# Patient Record
Sex: Male | Born: 1950 | Race: White | Hispanic: No | Marital: Married | Smoking: Former smoker
Health system: Southern US, Community
[De-identification: ages and names within clinical notes are randomized; demographics above are authoritative.]

## PROBLEM LIST (undated history)

## (undated) DIAGNOSIS — J302 Other seasonal allergic rhinitis: Secondary | ICD-10-CM

## (undated) DIAGNOSIS — R042 Hemoptysis: Secondary | ICD-10-CM

## (undated) DIAGNOSIS — C7931 Secondary malignant neoplasm of brain: Principal | ICD-10-CM

## (undated) DIAGNOSIS — F419 Anxiety disorder, unspecified: Secondary | ICD-10-CM

## (undated) DIAGNOSIS — F329 Major depressive disorder, single episode, unspecified: Secondary | ICD-10-CM

## (undated) DIAGNOSIS — J449 Chronic obstructive pulmonary disease, unspecified: Secondary | ICD-10-CM

## (undated) DIAGNOSIS — C3490 Malignant neoplasm of unspecified part of unspecified bronchus or lung: Secondary | ICD-10-CM

## (undated) DIAGNOSIS — I10 Essential (primary) hypertension: Secondary | ICD-10-CM

## (undated) DIAGNOSIS — Z923 Personal history of irradiation: Secondary | ICD-10-CM

## (undated) DIAGNOSIS — E781 Pure hyperglyceridemia: Secondary | ICD-10-CM

## (undated) DIAGNOSIS — F32A Depression, unspecified: Secondary | ICD-10-CM

## (undated) DIAGNOSIS — C4490 Unspecified malignant neoplasm of skin, unspecified: Secondary | ICD-10-CM

## (undated) DIAGNOSIS — C801 Malignant (primary) neoplasm, unspecified: Secondary | ICD-10-CM

## (undated) HISTORY — DX: Major depressive disorder, single episode, unspecified: F32.9

## (undated) HISTORY — DX: Anxiety disorder, unspecified: F41.9

## (undated) HISTORY — DX: Personal history of irradiation: Z92.3

## (undated) HISTORY — DX: Malignant neoplasm of unspecified part of unspecified bronchus or lung: C34.90

## (undated) HISTORY — DX: Essential (primary) hypertension: I10

## (undated) HISTORY — DX: Unspecified malignant neoplasm of skin, unspecified: C44.90

## (undated) HISTORY — PX: TYMPANOPLASTY: SHX33

## (undated) HISTORY — DX: Depression, unspecified: F32.A

## (undated) HISTORY — PX: TONSILLECTOMY: SUR1361

## (undated) HISTORY — DX: Pure hyperglyceridemia: E78.1

---

## 2012-06-07 ENCOUNTER — Telehealth: Payer: Self-pay

## 2012-06-07 NOTE — Telephone Encounter (Signed)
I spoke with Derrick Crosby and pt is scheduled to come in and see Dr. Herma Carson on Friday at 4:00. Derrick Crosby is aware to have CT/CXr placed on disc and send over to our office prior to pt appt. He states he will call and have this done. He is also aware to have pt arrive 15 min prior to 4:00 appt.

## 2012-06-07 NOTE — Progress Notes (Addendum)
Name: Derrick Crosby MRN: [id number] DOB: 01-22-1951  Referring Physician:  [name] Reason for Consultation:  Lung mass   INTERVENTIONAL PULMONOLOGY CONSULTATION NOTE   History of Present Illness:  61 year old active smoker with chronic obstructive pulmonary disease and baseline dyspnea at rest and on exertion as well as chronic cough with sputum production referred here for further evaluation of left hilar mass. Apparently the patient has been coughing up blood for quite some time.  He is not sure if he lost any weight.  He denies fevers, chills or purulent sputum.  There are no voice changes.  There are no headaches, weakness, numbness or double vision.  Past Medical History:  COPD, HTN, depression, anxiety, dyslipidemia, skin cancer  Medications:  ASA, Celexa, Clonazepam   Allergies:  None  Family History Lung disease:  None Malignancies:  Mother (smoker) - lung cancer  Social History Smoking:  1.5 ppd x 50 years = 75 pack years.  Actively smoking. Occupational exposure:  None  Review Of Systems: Constitutional: Negative for fever, chills, weight loss, malaise/fatigue and diaphoresis.  HENT: Negative for hearing loss, ear pain, nosebleeds, congestion, sore throat, neck pain, tinnitus and ear discharge.   Eyes: Negative for blurred vision, double vision, photophobia, pain, discharge and redness.  Respiratory: Positive for cough, hemoptysis, sputum production, shortness of breath, wheezing.  Negative for stridor.   Cardiovascular: Negative for chest pain, palpitations, orthopnea, claudication, leg swelling and PND.  Gastrointestinal: Negative for heartburn, nausea, vomiting, abdominal pain, diarrhea, constipation, blood in stool and melena.  Genitourinary: Negative for dysuria, urgency, frequency, hematuria and flank pain.  Musculoskeletal: Negative for myalgias, back pain, joint pain and falls.  Skin: Negative for itching and rash.  Neurological: Negative for dizziness,  tingling, tremors, sensory change, speech change, focal weakness, seizures, loss of consciousness, weakness and headaches.   Positive for anxiety / depression. Endo/Heme/Allergies: Negative for environmental allergies and polydipsia. Does not bruise/bleed easily.   Vital Signs: There were no vitals filed for this visit.  Physical Examination: General:  Looks older then stated age, no acute distress Neuro:  Alert, oriented, nonfocal HEENT:  Pink conjunctivae, moist mucus membranes Neck:  No lymphadenopathy Cardiovascular:  RRR, no murmurs Lungs:  Coarse bilateral rales, few expiratory wheezes, diminished air entry, prolonged expiratory phase Abdomen:  Soft, nontender, nondistended Musculoskeletal:  No cyanosis or edema,  Skin: No rash   Labs: No results found for this basename: HGB:3,HCT:3,WBC:3,PLT:3,NA:5,K:2,CL:5,CO2:5,GLUCOSE:5,BUN:5,CREATININE:5,CALCIUM:5,MG:5,PHOS:5,INR:5,APTT:5 in the last 168 hours  Chest CT(images reviewed):  06/06/2012 Large, central left hilar mass with direct extension into the posterior mediastinum. Enlarged AP window and subcarinal lymph nodes. Worrisome for metastatic adenopathy.  PFT:  NA  TTE:  NA  ASSESSMENT AND PLAN  Left hilar mass highly suspicious for primary bronchogenic carcinoma Hemoptysis Tobacco abuse COPD  -->  Flexible bronchoscopy / EBUS-TBNA for tissue diagnosis and staging -->  PET/CT -->  Full PFT -->  Smoking cessation -->  Follow up office appointment with test results  Orlean Bradford, M.D., F.C.C.P. Interventional Pulmonology [facility] Cell: [phone] Pager: [phone]  06/07/2012, 8:37 PM

## 2012-06-09 ENCOUNTER — Encounter: Payer: Self-pay | Admitting: *Deleted

## 2012-06-09 ENCOUNTER — Ambulatory Visit (INDEPENDENT_AMBULATORY_CARE_PROVIDER_SITE_OTHER): Payer: BC Managed Care – PPO | Admitting: Pulmonary Disease

## 2012-06-09 VITALS — BP 130/76 | HR 72 | Temp 97.1°F | Ht 72.0 in | Wt 157.4 lb

## 2012-06-09 DIAGNOSIS — R918 Other nonspecific abnormal finding of lung field: Secondary | ICD-10-CM

## 2012-06-09 DIAGNOSIS — J449 Chronic obstructive pulmonary disease, unspecified: Secondary | ICD-10-CM

## 2012-06-09 DIAGNOSIS — R222 Localized swelling, mass and lump, trunk: Secondary | ICD-10-CM

## 2012-06-09 NOTE — Patient Instructions (Addendum)
Bronchoscopy / EBUS (scheduled 8/6 @ 8:30) Follow up appointment with results 8/9 Full PFT 8/9

## 2012-06-12 ENCOUNTER — Telehealth: Payer: Self-pay | Admitting: Pulmonary Disease

## 2012-06-12 ENCOUNTER — Encounter (HOSPITAL_COMMUNITY): Payer: Self-pay | Admitting: Pharmacy Technician

## 2012-06-12 ENCOUNTER — Encounter (HOSPITAL_COMMUNITY): Payer: Self-pay | Admitting: *Deleted

## 2012-06-12 NOTE — Telephone Encounter (Signed)
Called [phone] spoke with Weyman Croon at [facility] and pre cert is not required for any outpatient procedures, including bronch's. LMOAM for Karima to return my call. Rhonda J Cobb

## 2012-06-13 ENCOUNTER — Encounter (HOSPITAL_COMMUNITY)
Admission: RE | Disposition: A | Discharge status: Home / Self Care | Payer: Self-pay | Source: Ambulatory Visit | Attending: Pulmonary Disease

## 2012-06-13 ENCOUNTER — Encounter (HOSPITAL_COMMUNITY): Payer: Self-pay | Admitting: Anesthesiology

## 2012-06-13 ENCOUNTER — Encounter (HOSPITAL_COMMUNITY): Payer: Self-pay | Admitting: Pulmonary Disease

## 2012-06-13 ENCOUNTER — Telehealth: Payer: Self-pay | Admitting: Pulmonary Disease

## 2012-06-13 ENCOUNTER — Ambulatory Visit (HOSPITAL_COMMUNITY): Payer: BC Managed Care – PPO | Admitting: Anesthesiology

## 2012-06-13 ENCOUNTER — Ambulatory Visit (HOSPITAL_COMMUNITY)
Admission: RE | Admit: 2012-06-13 | Discharge: 2012-06-13 | Disposition: A | Discharge status: Home / Self Care | Payer: BC Managed Care – PPO | Source: Ambulatory Visit | Attending: Pulmonary Disease | Admitting: Pulmonary Disease

## 2012-06-13 ENCOUNTER — Other Ambulatory Visit: Payer: Self-pay | Admitting: Pulmonary Disease

## 2012-06-13 DIAGNOSIS — F17200 Nicotine dependence, unspecified, uncomplicated: Secondary | ICD-10-CM | POA: Insufficient documentation

## 2012-06-13 DIAGNOSIS — R222 Localized swelling, mass and lump, trunk: Secondary | ICD-10-CM | POA: Insufficient documentation

## 2012-06-13 DIAGNOSIS — R042 Hemoptysis: Secondary | ICD-10-CM | POA: Insufficient documentation

## 2012-06-13 DIAGNOSIS — C3400 Malignant neoplasm of unspecified main bronchus: Secondary | ICD-10-CM | POA: Insufficient documentation

## 2012-06-13 DIAGNOSIS — J449 Chronic obstructive pulmonary disease, unspecified: Secondary | ICD-10-CM | POA: Insufficient documentation

## 2012-06-13 DIAGNOSIS — J4489 Other specified chronic obstructive pulmonary disease: Secondary | ICD-10-CM | POA: Insufficient documentation

## 2012-06-13 DIAGNOSIS — C3490 Malignant neoplasm of unspecified part of unspecified bronchus or lung: Secondary | ICD-10-CM

## 2012-06-13 DIAGNOSIS — I10 Essential (primary) hypertension: Secondary | ICD-10-CM | POA: Insufficient documentation

## 2012-06-13 DIAGNOSIS — R918 Other nonspecific abnormal finding of lung field: Secondary | ICD-10-CM

## 2012-06-13 HISTORY — DX: Chronic obstructive pulmonary disease, unspecified: J44.9

## 2012-06-13 HISTORY — DX: Hemoptysis: R04.2

## 2012-06-13 HISTORY — DX: Malignant neoplasm of unspecified part of unspecified bronchus or lung: C34.90

## 2012-06-13 HISTORY — DX: Malignant (primary) neoplasm, unspecified: C80.1

## 2012-06-13 HISTORY — DX: Other seasonal allergic rhinitis: J30.2

## 2012-06-13 HISTORY — PX: VIDEO BRONCHOSCOPY: SHX5072

## 2012-06-13 LAB — CBC
HCT: 41.7 % (ref 39.0–52.0)
Hemoglobin: 14.8 g/dL (ref 13.0–17.0)
MCHC: 35.5 g/dL (ref 30.0–36.0)
WBC: 9.4 10*3/uL (ref 4.0–10.5)

## 2012-06-13 LAB — PROTIME-INR
INR: 1 (ref 0.00–1.49)
Prothrombin Time: 13.4 seconds (ref 11.6–15.2)

## 2012-06-13 LAB — COMPREHENSIVE METABOLIC PANEL
ALT: 18 U/L (ref 0–53)
BUN: 18 mg/dL (ref 6–23)
CO2: 25 mEq/L (ref 19–32)
Calcium: 9.7 mg/dL (ref 8.4–10.5)
Creatinine, Ser: 0.98 mg/dL (ref 0.50–1.35)
GFR calc Af Amer: >90 mL/min (ref >90)
GFR calc non Af Amer: 86 mL/min — ABNORMAL LOW (ref >90)
Glucose, Bld: 98 mg/dL (ref 70–99)
Sodium: 137 mEq/L (ref 135–145)
Total Protein: 7.1 g/dL (ref 6.0–8.3)

## 2012-06-13 LAB — APTT: aPTT: 36 seconds (ref 24–37)

## 2012-06-13 SURGERY — BRONCHOSCOPY, WITH EBUS
Anesthesia: General | Site: Chest | Wound class: Clean Contaminated

## 2012-06-13 MED ORDER — LACTATED RINGERS IV SOLN
INTRAVENOUS | Status: DC
  Administered 2012-06-13: 09:00:00 via INTRAVENOUS

## 2012-06-13 MED ORDER — ONDANSETRON HCL 4 MG/2ML IJ SOLN
INTRAMUSCULAR | Status: DC | PRN
  Administered 2012-06-13: 4 mg via INTRAVENOUS

## 2012-06-13 MED ORDER — PROPOFOL 10 MG/ML IV EMUL
INTRAVENOUS | Status: DC | PRN
  Administered 2012-06-13: 160 mg via INTRAVENOUS

## 2012-06-13 MED ORDER — 0.9 % SODIUM CHLORIDE (POUR BTL) OPTIME
TOPICAL | Status: DC | PRN
  Administered 2012-06-13: 1000 mL

## 2012-06-13 MED ORDER — NEOSTIGMINE METHYLSULFATE 1 MG/ML IJ SOLN
INTRAMUSCULAR | Status: DC | PRN
  Administered 2012-06-13: 4 mg via INTRAVENOUS

## 2012-06-13 MED ORDER — FENTANYL CITRATE 0.05 MG/ML IJ SOLN
INTRAMUSCULAR | Status: DC | PRN
  Administered 2012-06-13: 50 ug via INTRAVENOUS

## 2012-06-13 MED ORDER — FENTANYL CITRATE 0.05 MG/ML IJ SOLN
INTRAMUSCULAR | Status: AC
  Filled 2012-06-13: qty 2

## 2012-06-13 MED ORDER — EPINEPHRINE HCL 1 MG/ML IJ SOLN
INTRAMUSCULAR | Status: DC | PRN
  Administered 2012-06-13: 1 mg

## 2012-06-13 MED ORDER — GLYCOPYRROLATE 0.2 MG/ML IJ SOLN
INTRAMUSCULAR | Status: DC | PRN
  Administered 2012-06-13: .7 mg via INTRAVENOUS

## 2012-06-13 MED ORDER — ROCURONIUM BROMIDE 100 MG/10ML IV SOLN
INTRAVENOUS | Status: DC | PRN
  Administered 2012-06-13: 40 mg via INTRAVENOUS

## 2012-06-13 MED ORDER — FENTANYL CITRATE 0.05 MG/ML IJ SOLN: 50.0000 ug | INTRAMUSCULAR | Status: DC | PRN

## 2012-06-13 MED ORDER — LACTATED RINGERS IV SOLN
INTRAVENOUS | Status: DC | PRN
  Administered 2012-06-13 (×2): via INTRAVENOUS

## 2012-06-13 MED ORDER — LIDOCAINE HCL (CARDIAC) 20 MG/ML IV SOLN
INTRAVENOUS | Status: DC | PRN
  Administered 2012-06-13: 100 mg via INTRAVENOUS

## 2012-06-13 MED ORDER — FENTANYL CITRATE 0.05 MG/ML IJ SOLN
25.0000 ug | INTRAMUSCULAR | Status: DC | PRN
  Administered 2012-06-13: 25 ug via INTRAVENOUS
  Administered 2012-06-13: 50 ug via INTRAVENOUS
  Administered 2012-06-13: 25 ug via INTRAVENOUS

## 2012-06-13 MED ORDER — MIDAZOLAM HCL 5 MG/5ML IJ SOLN
INTRAMUSCULAR | Status: DC | PRN
  Administered 2012-06-13: 2 mg via INTRAVENOUS

## 2012-06-13 MED ORDER — MIDAZOLAM HCL 2 MG/2ML IJ SOLN: 1.0000 mg | INTRAMUSCULAR | Status: DC | PRN

## 2012-06-13 SURGICAL SUPPLY — 23 items
BRUSH CYTOL CELLEBRITY 1.5X140 (MISCELLANEOUS) IMPLANT
CANISTER SUCTION 2500CC (MISCELLANEOUS) ×2 IMPLANT
CLOTH BEACON ORANGE TIMEOUT ST (SAFETY) ×2 IMPLANT
CONT SPEC 4OZ CLIKSEAL STRL BL (MISCELLANEOUS) ×4 IMPLANT
COVER TABLE BACK 60X90 (DRAPES) ×2 IMPLANT
FILTER STRAW FLUID ASPIR (MISCELLANEOUS) ×2 IMPLANT
FORCEPS BIOP RJ4 1.8 (CUTTING FORCEPS) ×2 IMPLANT
GLOVE SURG SIGNA 7.5 PF LTX (GLOVE) ×2 IMPLANT
KIT ROOM TURNOVER OR (KITS) ×2 IMPLANT
MARKER SKIN DUAL TIP RULER LAB (MISCELLANEOUS) ×2 IMPLANT
NEEDLE BIOPSY TRANSBRONCH 21G (NEEDLE) IMPLANT
NEEDLE SYS SONOTIP II EBUSTBNA (NEEDLE) ×4 IMPLANT
NS IRRIG 1000ML POUR BTL (IV SOLUTION) ×2 IMPLANT
OIL SILICONE PENTAX (PARTS (SERVICE/REPAIRS)) ×2 IMPLANT
PAD ARMBOARD 7.5X6 YLW CONV (MISCELLANEOUS) ×4 IMPLANT
SPONGE GAUZE 4X4 12PLY (GAUZE/BANDAGES/DRESSINGS) IMPLANT
SYR 20CC LL (SYRINGE) ×2 IMPLANT
SYR 20ML ECCENTRIC (SYRINGE) ×2 IMPLANT
SYR 5ML LUER SLIP (SYRINGE) ×4 IMPLANT
SYR TOOMEY 50ML (SYRINGE) ×4 IMPLANT
TOWEL OR 17X24 6PK STRL BLUE (TOWEL DISPOSABLE) ×2 IMPLANT
TRAP SPECIMEN MUCOUS 40CC (MISCELLANEOUS) ×2 IMPLANT
TUBE CONNECTING 12X1/4 (SUCTIONS) ×2 IMPLANT

## 2012-06-13 NOTE — Preoperative (Signed)
Beta Blockers   Reason not to administer Beta Blockers:Not Applicable. No home beta blockers 

## 2012-06-13 NOTE — Transfer of Care (Signed)
Immediate Anesthesia Transfer of Care Note  Patient: Derrick Crosby  Procedure(s) Performed: Procedure(s) (LRB): VIDEO BRONCHOSCOPY WITH ENDOBRONCHIAL ULTRASOUND (N/A)  Patient Location: PACU  Anesthesia Type: General  Level of Consciousness: awake, alert  and oriented  Airway & Oxygen Therapy: Patient Spontanous Breathing and Patient connected to nasal cannula oxygen  Post-op Assessment: Report given to PACU RN and Post -op Vital signs reviewed and stable  Post vital signs: Reviewed and stable  Complications: No apparent anesthesia complications

## 2012-06-13 NOTE — Op Note (Signed)
Name:  [name] MRN:  [id number] DOB:  1951/01/03  OP NOTE  Procedure(s): Flexible bronchoscopy [phone]) Endobronchial biopsy (62130) of the left mainstem bronchus lesion Endobronchial ultrasound ([id number]) Transbronchial needle aspiration (46962) of the left hilar mass  Indications:  Left hilar mass suspicious for bronchogenic carcinoma.  Hemoptysis.  Consent:  Procedure, benefits, risks and alternatives discussed.  Questions answered.  Consent obtained.  Anesthesia:  General endotracheal.  Procedure summary:  Appropriate equipment was assembled.  The patient was brought to the operating room and identified as Derrick Crosby.  Safety timeout was performed. The patient was placed supine on the operating table, airway established and general anesthesia administered by Anesthesia team.   After the appropriate level of anesthesia was assured, flexible video bronchoscope was lubricated and inserted through the endotracheal tube.    Airway examination was performed bilaterally.  Trachea appear normal.  Main carina was not splayed.  Right lung airways were examined to the subsegmental level and no mucosal abnormalities or endobronchial lesions were identified. Then the attention was turned to the left lung airway. Moderate amount of old blood was aspirated from the left mainstem bronchus. Small friable mucosal lesion measuring about 5 mm in diameter was noted on the posteriolateral surface of the left mainstem bronchus immediately above the secondary carina. That was biopsied with forceps. Minor blood oozing was controlled was 3 mL of 1:20,000 dilution epinephrine used via the bronchoscope. Left upper lobe airway and lingula airway appeared normal. Left lower lobe bronchus was 90% obstructed by friable endobronchial mass which was oozing blood. I was able to pass the bronchoscope past the lesion and visualized distal airways which appear nomal.  The decision was made not to pursue any further  endobronchial forceps biopsy out of fear of further hemorrhage.  Endobronchial ultrasound video bronchoscope was then lubricated and inserted through the endotracheal tube. Surveillance of the mediastinal and and bilateral hilar lymph node stations was performed.  Paratracheal lymph nodes could not be easily visualized. There were no abnormal station 7 and station 10/11 R lymph nodes noted.  Endobronchial ultrasound guided transbronchial needle aspiration of the left hilar mass was then performed.  Slides were sent for intraoperative assessment and revealed high grade malignancy.  EBUS bronchoscope was withdrawn.  Flexible video bronchoscope was used again to assure hemostasis.  Additional 3 mL of 1:20,000 epinephrine were used.  After hemostasis was assured, the bronchoscope was withdrawn.  The patient was extubated in operating room and transferred to PACU.  Specimens sent: Left mainstem bronchus endobronchial lesion forceps biopsy sample for pathology Left hilar mass transbronchial needle aspiration  sample for pathology  Complications:  No immediate complications were noted.  Hemodynamic parameters and oxygenation remained stable throughout the procedure.  Estimated blood loss:  Less then 15 mL.  Orlean Bradford, M.D. Pulmonary and Critical Care Medicine [facility] Cell: [phone]  06/13/2012, 8:04 PM

## 2012-06-13 NOTE — Telephone Encounter (Signed)
I spoke with spouse and she is wanting to know if pt can eat before his PET scan on Friday. I advised her pt is not suppose to eat/drink anything after midnight. She then wanted to know if he could before his appt with Dr. Herma Carson. I advised her that was fine. Nothing further was needed

## 2012-06-13 NOTE — Anesthesia Postprocedure Evaluation (Signed)
  Anesthesia Post-op Note  Patient: Derrick Crosby  Procedure(s) Performed: Procedure(s) (LRB): VIDEO BRONCHOSCOPY WITH ENDOBRONCHIAL ULTRASOUND (N/A)  Patient Location: PACU  Anesthesia Type: General  Level of Consciousness: awake  Airway and Oxygen Therapy: Patient Spontanous Breathing  Post-op Pain: none  Post-op Assessment: Post-op Vital signs reviewed, Patient's Cardiovascular Status Stable, Respiratory Function Stable and Patent Airway  Post-op Vital Signs: stable  Complications: No apparent anesthesia complications

## 2012-06-13 NOTE — Interval H&P Note (Signed)
Patient examined.  Records reviewed.  No changes since last seen 06/09/2012.  Procedure explained.  Questions answered.  Consent obtained.

## 2012-06-13 NOTE — Anesthesia Preprocedure Evaluation (Signed)
Anesthesia Evaluation  Patient identified by MRN, date of birth, ID band Patient awake    Reviewed: Allergy & Precautions, H&P , NPO status   Airway Mallampati: I TM Distance: >3 FB Neck ROM: Full    Dental  (+) Edentulous Upper and Edentulous Lower   Pulmonary COPD + rhonchi         Cardiovascular hypertension,     Neuro/Psych    GI/Hepatic   Endo/Other    Renal/GU      Musculoskeletal   Abdominal   Peds  Hematology   Anesthesia Other Findings   Reproductive/Obstetrics                           Anesthesia Physical Anesthesia Plan  ASA: III  Anesthesia Plan: General   Post-op Pain Management:    Induction: Intravenous  Airway Management Planned: Oral ETT  Additional Equipment:   Intra-op Plan:   Post-operative Plan: Extubation in OR  Informed Consent: I have reviewed the patients History and Physical, chart, labs and discussed the procedure including the risks, benefits and alternatives for the proposed anesthesia with the patient or authorized representative who has indicated his/her understanding and acceptance.     Plan Discussed with: CRNA and Surgeon  Anesthesia Plan Comments:         Anesthesia Quick Evaluation

## 2012-06-13 NOTE — Telephone Encounter (Signed)
LMOAM for Karmia to return my call. Rhonda J Cobb

## 2012-06-13 NOTE — H&P (View-Only) (Signed)
Name: Derrick Crosby MRN: [id number] DOB: 09-Sep-1951  Referring Physician:  [name] Reason for Consultation:  Lung mass   INTERVENTIONAL PULMONOLOGY CONSULTATION NOTE   History of Present Illness: 61 yo with hemoptysis referred for evaluation of right hilar mass.  No past medical history on file. No past surgical history on file. Prior to Admission medications   Not on File   Allergies Allergies not on file  Family History Lung disease:  None Malignancies:  None  Social History Smoking:  50-pack-years Occupational exposure:  None  Review Of Systems: Constitutional: Negative for fever, chills, weight loss, malaise/fatigue and diaphoresis.  HENT: Negative for hearing loss, ear pain, nosebleeds, congestion, sore throat, neck pain, tinnitus and ear discharge.   Eyes: Negative for blurred vision, double vision, photophobia, pain, discharge and redness.  Respiratory: Negative for cough, hemoptysis, sputum production, shortness of breath, wheezing and stridor.   Cardiovascular: Negative for chest pain, palpitations, orthopnea, claudication, leg swelling and PND.  Gastrointestinal: Negative for heartburn, nausea, vomiting, abdominal pain, diarrhea, constipation, blood in stool and melena.  Genitourinary: Negative for dysuria, urgency, frequency, hematuria and flank pain.  Musculoskeletal: Negative for myalgias, back pain, joint pain and falls.  Skin: Negative for itching and rash.  Neurological: Negative for dizziness, tingling, tremors, sensory change, speech change, focal weakness, seizures, loss of consciousness, weakness and headaches.  Endo/Heme/Allergies: Negative for environmental allergies and polydipsia. Does not bruise/bleed easily.   Vital Signs: There were no vitals filed for this visit.  Physical Examination: General:  No acute distress Neuro:  Alert, oriented, nonfocal HEENT:  Pink conjunctivae, moist mucus membranes Neck:  No  lymphadenopathy Cardiovascular:  RRR, no murmurs Lungs:  Coarse bilateral rales, few expiratory wheezes Abdomen:  Soft, nontender, nondistended Musculoskeletal:  No cyanosis or edema,  Skin: No rash   Labs: No results found for this basename: HGB:3,HCT:3,WBC:3,PLT:3,NA:5,K:2,CL:5,CO2:5,GLUCOSE:5,BUN:5,CREATININE:5,CALCIUM:5,MG:5,PHOS:5,INR:5,APTT:5 in the last 168 hours  Chest CT(images reviewed):  PFT:  TTE:  ASSESSMENT AND PLAN  Right hilar mass highly suspicious for primary bronchogenic carcinoma Hemoptysis Tobacco abuse Suspected COPD  -->  Flexible bronchoscopy / EBUS-TBNA for tissue diagnosis and staging  Orlean Bradford, M.D., F.C.C.P. Interventional Pulmonology [facility] Cell: [phone] Pager: [phone]  06/07/2012, 8:37 PM

## 2012-06-13 NOTE — Telephone Encounter (Signed)
Spoke with Karima at [facility] and advised that pre cert was not required for any outpatient procedures including bronch's. Rhonda J Cobb

## 2012-06-14 ENCOUNTER — Ambulatory Visit
Admission: RE | Admit: 2012-06-14 | Discharge: 2012-06-14 | Disposition: A | Discharge status: Home / Self Care | Payer: BC Managed Care – PPO | Source: Ambulatory Visit | Attending: Radiation Oncology | Admitting: Radiation Oncology

## 2012-06-14 ENCOUNTER — Encounter: Payer: Self-pay | Admitting: *Deleted

## 2012-06-14 ENCOUNTER — Ambulatory Visit: Payer: BC Managed Care – PPO

## 2012-06-14 ENCOUNTER — Ambulatory Visit: Admit: 2012-06-14 | Payer: BC Managed Care – PPO | Admitting: Radiation Oncology

## 2012-06-14 ENCOUNTER — Encounter: Payer: Self-pay | Admitting: Radiation Oncology

## 2012-06-14 VITALS — BP 111/62 | HR 64 | Temp 97.7°F | Resp 20

## 2012-06-14 DIAGNOSIS — F329 Major depressive disorder, single episode, unspecified: Secondary | ICD-10-CM | POA: Insufficient documentation

## 2012-06-14 DIAGNOSIS — Z510 Encounter for antineoplastic radiation therapy: Secondary | ICD-10-CM | POA: Insufficient documentation

## 2012-06-14 DIAGNOSIS — F419 Anxiety disorder, unspecified: Secondary | ICD-10-CM | POA: Insufficient documentation

## 2012-06-14 DIAGNOSIS — Z79899 Other long term (current) drug therapy: Secondary | ICD-10-CM | POA: Insufficient documentation

## 2012-06-14 DIAGNOSIS — R918 Other nonspecific abnormal finding of lung field: Secondary | ICD-10-CM

## 2012-06-14 DIAGNOSIS — C3490 Malignant neoplasm of unspecified part of unspecified bronchus or lung: Secondary | ICD-10-CM

## 2012-06-14 NOTE — Progress Notes (Signed)
Pt has cough but he had this cough pre op so I advised his wife to call the dr. 's office  And ask if there is anything they can do .

## 2012-06-14 NOTE — Progress Notes (Signed)
Please see the Nurse Progress Note in the MD Initial Consult Encounter for this patient. 

## 2012-06-14 NOTE — Progress Notes (Signed)
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

## 2012-06-15 DIAGNOSIS — C3400 Malignant neoplasm of unspecified main bronchus: Secondary | ICD-10-CM | POA: Insufficient documentation

## 2012-06-15 NOTE — Progress Notes (Signed)
Radiation Oncology         [phone] ________________________________  Name: Derrick Crosby MRN: [id number]  Date: 06/14/2012  DOB: Mar 27, 1951  NW:GNFAOZ,HYQMVH, PA  Zubelevitskiy, Konstant*     REFERRING PHYSICIAN: Rodena Medin*   DIAGNOSIS: The primary encounter diagnosis was Lung cancer. A diagnosis of Lung mass was also pertinent to this visit.   HISTORY OF PRESENT ILLNESS::Derrick Crosby is a 61 y.o. male who is seen for an initial consultation visit. The patient has a history of some baseline dyspnea at rest. He is also had a chronic cough. The patient states that this has been present for a number of years. The patient recently informed one of his physicians that he had been experiencing some hemoptysis. It is unclear how long this has been going on. The patient did not really specify this today on questioning and has not told his family. It does appear that this has been going on for quite some time. This prompted further workup which included a chest x-ray and CT scan was was performed in [place]. I don't have the details of the scans but we'll need to request this. There was a hilar mass which was suspicious for possible malignancy and he therefore underwent further evaluation and workup through pulmonology.  This led to a bronchoscopy which was recently completed on 06/13/2012. The right lung was clear. A moderate amount of blood which was ALT was aspirated from the left mainstem bronchus. A friable mucosal lesion was seen approximately 5 mm in diameter which was noted within the left mainstem bronchus immediately above the secondary carina. This was biopsied. Within the lower lobe and endobronchial mass was seen which was oozing blood. This caused 90% obstruction. The bronchoscope was able to pass this lesion in the distal airways were normal. Endobronchial ultrasound was performed with transbronchial needle aspiration of the left hilar mass which was seen. The patient's  initial surgical pathology returned negative for malignancy but cytology is still pending.  The patient denies other significant symptoms other than some fatigue. He denies any significant change in shortness of breath in recent months. He also denies any chest pain or distant pain elsewhere.   PREVIOUS RADIATION THERAPY: No   PAST MEDICAL HISTORY:  has a past medical history of Hypertension; Hyperglyceridemia, pure; COPD (chronic obstructive pulmonary disease); Cancer; Hemoptysis; Skin cancer; Anxiety; Depression; Lung cancer (06/13/12); Hemoptysis; and Seasonal allergies.     PAST SURGICAL HISTORY: Past Surgical History  Procedure Date  . No past surgeries   . Tympanoplasty   . Video bronchoscopy 06/13/12    with endobronchial u/s/ with biospy left hilar mass/Dr. Lonia Farber  . Tonsillectomy     child     FAMILY HISTORY: family history includes Cancer in his mother and Lung cancer in his mother.   SOCIAL HISTORY:  reports that he quit smoking 7 days ago. His smoking use included Cigarettes. He has a 25 pack-year smoking history. He has never used smokeless tobacco. He reports that he drinks alcohol. He reports that he does not use illicit drugs.   ALLERGIES: Review of patient's allergies indicates no known allergies.   MEDICATIONS:  Current Outpatient Prescriptions  Medication Sig Dispense Refill  . acetaminophen (TYLENOL) 325 MG tablet Take 325 mg by mouth every 6 (six) hours as needed. Takes 2 prn  General aches      . citalopram (CELEXA) 20 MG tablet Take 20 mg by mouth daily.      . clonazePAM (KLONOPIN) 0.5 MG tablet Take  0.5 mg by mouth 2 (two) times daily as needed.      . fish oil-omega-3 fatty acids 1000 MG capsule Take 1,200 mg by mouth daily.      . Multiple Vitamins-Minerals (MULTIVITAMIN WITH MINERALS) tablet Take 1 tablet by mouth daily.      Marland Kitchen triamcinolone cream (KENALOG) 0.1 % Apply topically 2 (two) times daily.      Marland Kitchen aspirin 81 MG chewable  tablet Chew 81 mg by mouth daily.         REVIEW OF SYSTEMS:  A 15 point review of systems is documented in the electronic medical record. This was obtained by the nursing staff. However, I reviewed this with the patient to discuss relevant findings and make appropriate changes.  Pertinent items are noted in HPI.    PHYSICAL EXAM:  oral temperature is 97.7 F (36.5 C). His blood pressure is 111/62 and his pulse is 64. His respiration is 20 and oxygen saturation is 96%.   General: Well-developed, in no acute distress HEENT: Normocephalic, atraumatic; oral cavity clear Neck: Supple without any lymphadenopathy Cardiovascular: Regular rate and rhythm Respiratory: Clear to auscultation bilaterally, some decreased breath sounds in the left base GI: Soft, nontender, normal bowel sounds Extremities: No edema present Neuro: No focal deficits    LABORATORY DATA:  Lab Results  Component Value Date   WBC 9.4 06/13/2012   HGB 14.8 06/13/2012   HCT 41.7 06/13/2012   MCV 87.8 06/13/2012   PLT 180 06/13/2012   Lab Results  Component Value Date   NA 137 06/13/2012   K 4.2 06/13/2012   CL 102 06/13/2012   CO2 25 06/13/2012   Lab Results  Component Value Date   ALT 18 06/13/2012   AST 19 06/13/2012   ALKPHOS 130* 06/13/2012   BILITOT 0.4 06/13/2012      RADIOGRAPHY: No results found.     IMPRESSION: 61 year old male with presumptive lung cancer at this time. Further pathology pending for a suspicious left hilar mass. A PET scan is also pending which has been scheduled for this Friday on 06/16/2012.   PLAN: I had a discussion with the patient and family members about his workup thus far. I was somewhat cautioned area given that we do not have a pathologic diagnosis at this time. However based on the patient's clinical presentation and bronchoscopy results, this is extremely suspicious for cancer and we discussed this fact.  Given this I discussed the major modalities which can be used to treat lung cancer.  They are aware that this is a very individualized decision and the PET scan in particular will be very helpful to clarify his case. Further staging with imaging of the brain will also be necessary if the patient's pathology returns malignant as expected.  I did discuss the possible role of radiation and in general what this entails. Their questions were answered about this.  I will therefore followup on the upcoming cytology results as well as his PET scan in 2 days. The family is aware that based on these results I may recommend further evaluation with medical oncology, surgical oncology, and also potentially discussion at next week's thoracic tumor Board. Contact information was given.    I spent 60 minutes minutes face to face with the patient and more than 50% of that time was spent in counseling and/or coordination of care.    ________________________________   [name], MD, PhD

## 2012-06-16 ENCOUNTER — Ambulatory Visit (INDEPENDENT_AMBULATORY_CARE_PROVIDER_SITE_OTHER): Payer: BC Managed Care – PPO | Admitting: Pulmonary Disease

## 2012-06-16 ENCOUNTER — Encounter: Payer: Self-pay | Admitting: Pulmonary Disease

## 2012-06-16 ENCOUNTER — Ambulatory Visit (HOSPITAL_COMMUNITY)
Admission: RE | Admit: 2012-06-16 | Discharge: 2012-06-16 | Disposition: A | Discharge status: Home / Self Care | Payer: BC Managed Care – PPO | Source: Ambulatory Visit | Attending: Pulmonary Disease | Admitting: Pulmonary Disease

## 2012-06-16 ENCOUNTER — Encounter (HOSPITAL_COMMUNITY): Payer: Self-pay

## 2012-06-16 ENCOUNTER — Encounter (HOSPITAL_COMMUNITY)
Admit: 2012-06-16 | Discharge: 2012-06-16 | Disposition: A | Discharge status: Home / Self Care | Payer: BC Managed Care – PPO | Attending: Pulmonary Disease | Admitting: Pulmonary Disease

## 2012-06-16 VITALS — BP 116/64 | HR 70 | Temp 98.6°F | Ht 69.0 in | Wt 157.0 lb

## 2012-06-16 DIAGNOSIS — J988 Other specified respiratory disorders: Secondary | ICD-10-CM | POA: Insufficient documentation

## 2012-06-16 DIAGNOSIS — R042 Hemoptysis: Secondary | ICD-10-CM | POA: Insufficient documentation

## 2012-06-16 DIAGNOSIS — I708 Atherosclerosis of other arteries: Secondary | ICD-10-CM | POA: Insufficient documentation

## 2012-06-16 DIAGNOSIS — C3490 Malignant neoplasm of unspecified part of unspecified bronchus or lung: Secondary | ICD-10-CM

## 2012-06-16 DIAGNOSIS — N200 Calculus of kidney: Secondary | ICD-10-CM | POA: Insufficient documentation

## 2012-06-16 DIAGNOSIS — C7970 Secondary malignant neoplasm of unspecified adrenal gland: Secondary | ICD-10-CM | POA: Insufficient documentation

## 2012-06-16 DIAGNOSIS — R059 Cough, unspecified: Secondary | ICD-10-CM | POA: Insufficient documentation

## 2012-06-16 DIAGNOSIS — I700 Atherosclerosis of aorta: Secondary | ICD-10-CM | POA: Insufficient documentation

## 2012-06-16 DIAGNOSIS — R918 Other nonspecific abnormal finding of lung field: Secondary | ICD-10-CM

## 2012-06-16 DIAGNOSIS — K7689 Other specified diseases of liver: Secondary | ICD-10-CM | POA: Insufficient documentation

## 2012-06-16 DIAGNOSIS — K429 Umbilical hernia without obstruction or gangrene: Secondary | ICD-10-CM | POA: Insufficient documentation

## 2012-06-16 DIAGNOSIS — J438 Other emphysema: Secondary | ICD-10-CM | POA: Insufficient documentation

## 2012-06-16 DIAGNOSIS — K314 Gastric diverticulum: Secondary | ICD-10-CM | POA: Insufficient documentation

## 2012-06-16 DIAGNOSIS — J449 Chronic obstructive pulmonary disease, unspecified: Secondary | ICD-10-CM

## 2012-06-16 DIAGNOSIS — R05 Cough: Secondary | ICD-10-CM | POA: Insufficient documentation

## 2012-06-16 DIAGNOSIS — F17200 Nicotine dependence, unspecified, uncomplicated: Secondary | ICD-10-CM | POA: Insufficient documentation

## 2012-06-16 DIAGNOSIS — R222 Localized swelling, mass and lump, trunk: Secondary | ICD-10-CM | POA: Insufficient documentation

## 2012-06-16 MED ORDER — ALBUTEROL SULFATE HFA 108 (90 BASE) MCG/ACT IN AERS: 2.0000 | INHALATION_SPRAY | Freq: Four times a day (QID) | RESPIRATORY_TRACT | Status: DC | PRN

## 2012-06-16 MED ORDER — ALBUTEROL SULFATE (5 MG/ML) 0.5% IN NEBU
2.5000 mg | INHALATION_SOLUTION | Freq: Once | RESPIRATORY_TRACT | Status: AC
  Administered 2012-06-16: 2.5 mg via RESPIRATORY_TRACT

## 2012-06-16 MED ORDER — FLUDEOXYGLUCOSE F - 18 (FDG) INJECTION
19.4000 | Freq: Once | INTRAVENOUS | Status: AC | PRN
  Administered 2012-06-16: 19.4 via INTRAVENOUS

## 2012-06-16 NOTE — Patient Instructions (Addendum)
Medical oncology appointment  Radiation oncology appointment Back to clinic 4-6 weeks to reevaluate the need for repeat flexible bronchoscopy (endobronchial lesion) Albuterol PRN

## 2012-06-16 NOTE — Progress Notes (Signed)
Name: Derrick Crosby MRN: [id number] DOB: 11/22/1950  Referring Physician:  [name] Reason for Consultation:  Lung mass   INTERVENTIONAL PULMONOLOGY FOLLOW UP NOTE   History of Present Illness:  61 year old active smoker with chronic obstructive pulmonary disease and baseline dyspnea at rest and on exertion as well as chronic cough with sputum production referred here for further evaluation of left hilar mass. Apparently the patient has been coughing up blood for quite some time.  He is not sure if he lost any weight.  He denies fevers, chills or purulent sputum.  There are no voice changes.  There are no headaches, weakness, numbness or double vision.  Interval history:   Presents today to review biopsy results.  Reports less cough and no hemoptysis since procedure.  He now abstains form smoking.   Past Medical History:  COPD, HTN, depression, anxiety, dyslipidemia, skin cancer  Medications:  ASA, Celexa, Clonazepam   Allergies:  None  Family History Lung disease:  None Malignancies:  Mother (smoker) - lung cancer  Social History Smoking:  1.5 ppd x 50 years = 75 pack years.  Actively smoking. Occupational exposure:  None  Review Of Systems: Constitutional: Negative for fever, chills, weight loss, malaise/fatigue and diaphoresis.  HENT: Negative for hearing loss, ear pain, nosebleeds, congestion, sore throat, neck pain, tinnitus and ear discharge.   Eyes: Negative for blurred vision, double vision, photophobia, pain, discharge and redness.  Respiratory: Positive for cough, hemoptysis, sputum production, shortness of breath, wheezing.  Negative for stridor.   Cardiovascular: Negative for chest pain, palpitations, orthopnea, claudication, leg swelling and PND.  Gastrointestinal: Negative for heartburn, nausea, vomiting, abdominal pain, diarrhea, constipation, blood in stool and melena.  Genitourinary: Negative for dysuria, urgency, frequency, hematuria and flank pain.    Musculoskeletal: Negative for myalgias, back pain, joint pain and falls.  Skin: Negative for itching and rash.  Neurological: Negative for dizziness, tingling, tremors, sensory change, speech change, focal weakness, seizures, loss of consciousness, weakness and headaches.   Positive for anxiety / depression. Endo/Heme/Allergies: Negative for environmental allergies and polydipsia. Does not bruise/bleed easily.   Vital Signs: Filed Vitals:   06/16/12 1435 06/16/12 1440  BP:  116/64  Pulse:  70  Temp: 98.6 F (37 C)   TempSrc: Oral   Height: 5\' 9"  (1.753 m)   Weight: 71.215 kg (157 lb)   SpO2:  94%   Physical Examination: General:  No acute distress Neuro:  Alert, oriented, nonfocal HEENT:  Pink conjunctivae, moist mucus membranes Neck:  No lymphadenopathy Cardiovascular:  RRR, no murmurs Lungs:  Bilateral diminished breath sounds, no rhomchi Abdomen:  Soft, nontender, nondistended Musculoskeletal:  No cyanosis or edema,  Skin: No rash   Labs:  Lab 06/13/12 0705  HGB 14.8  HCT 41.7  WBC 9.4  PLT 180  NA 137  K 4.2  CL 102  CO2 25  GLUCOSE 98  BUN 18  CREATININE 0.98  CALCIUM 9.7  MG --  PHOS --  INR 1.00  APTT 36   Chest CT(images reviewed):    06/06/2012 Large, central left hilar mass with direct extension into the posterior mediastinum. Enlarged AP window and subcarinal lymph nodes. Worrisome for metastatic adenopathy.  PET CT(images reviewed):    06/16/2012  Large hypermetabolic central left hilar mass with medial mediastinal invasion and a pathologic hypermetabolic prevascular lymph node. Bilateral adrenal metastatic lesions.   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  May need bronchoscopic tumor debulking. -->  Continue smoking abstinence -->  Albuterol PRN  Orlean Bradford, M.D., F.C.C.P. Interventional Pulmonology [facility] Cell: [phone] Pager: [phone]  06/16/2012, 9:26 PM

## 2012-06-17 ENCOUNTER — Other Ambulatory Visit: Payer: Self-pay | Admitting: Radiation Oncology

## 2012-06-17 DIAGNOSIS — C3400 Malignant neoplasm of unspecified main bronchus: Secondary | ICD-10-CM

## 2012-06-17 NOTE — Progress Notes (Signed)
Patient has apparent stage IV disease on PET scan. Path suggests NSCLC with squamous differentiation.  Have ordered MRI brain for staging, med onc referral.  Will schedule simulation for palliative XRT to chest.

## 2012-06-19 ENCOUNTER — Telehealth: Payer: Self-pay | Admitting: Pulmonary Disease

## 2012-06-19 ENCOUNTER — Telehealth: Payer: Self-pay

## 2012-06-19 ENCOUNTER — Encounter: Payer: Self-pay | Admitting: *Deleted

## 2012-06-19 NOTE — Progress Notes (Signed)
Received email from Dr. Herma Carson regarding referral.  Pt will be scheduled 06/27/12

## 2012-06-19 NOTE — Telephone Encounter (Signed)
I spoke with spouse and she was calling to confirm pt radiation appt with oncology was on 06/29/12 at 9:00. This was the soonest they could get pt in. She is wanting to make sure Dr. Herma Carson is aware of this and is fine with this. I called Dr. Herma Carson and made him aware of this. He will call spouse back.

## 2012-06-19 NOTE — Telephone Encounter (Signed)
APPOINTMENT FOR MRI GIVEN TO PATIENT'S [facility] FOR Thursday 06/22/12 AT 10:00 AM TO ARRIVE AT 9:45 AM. NO PREP.

## 2012-06-20 ENCOUNTER — Telehealth: Payer: Self-pay | Admitting: Internal Medicine

## 2012-06-20 NOTE — Telephone Encounter (Signed)
aware of new pt appt on 8/20   aom

## 2012-06-22 ENCOUNTER — Ambulatory Visit (HOSPITAL_COMMUNITY)
Admission: RE | Admit: 2012-06-22 | Discharge: 2012-06-22 | Disposition: A | Discharge status: Home / Self Care | Payer: BC Managed Care – PPO | Source: Ambulatory Visit | Attending: Radiation Oncology | Admitting: Radiation Oncology

## 2012-06-22 ENCOUNTER — Telehealth: Payer: Self-pay | Admitting: Pulmonary Disease

## 2012-06-22 ENCOUNTER — Telehealth: Payer: Self-pay | Admitting: Internal Medicine

## 2012-06-22 DIAGNOSIS — R059 Cough, unspecified: Secondary | ICD-10-CM | POA: Insufficient documentation

## 2012-06-22 DIAGNOSIS — C3400 Malignant neoplasm of unspecified main bronchus: Secondary | ICD-10-CM

## 2012-06-22 DIAGNOSIS — R05 Cough: Secondary | ICD-10-CM | POA: Insufficient documentation

## 2012-06-22 MED ORDER — GADOBENATE DIMEGLUMINE 529 MG/ML IV SOLN
15.0000 mL | Freq: Once | INTRAVENOUS | Status: AC | PRN
  Administered 2012-06-22: 15 mL via INTRAVENOUS

## 2012-06-22 NOTE — Telephone Encounter (Signed)
Gave paperwork to Mindy.  Antionette Fairy

## 2012-06-22 NOTE — Telephone Encounter (Signed)
I have paperwork and will give to Dr. Herma Carson when he is tomorrow

## 2012-06-22 NOTE — Telephone Encounter (Signed)
Referred by Dr. Marin Shutter Dx- Lung CA. NP packet mailed out.

## 2012-06-23 NOTE — Telephone Encounter (Signed)
Dr. Herma Carson has filled out paperwork. This was placed upfront for pick up. i made spouse aware of this and she will pick this up tuesday

## 2012-06-26 ENCOUNTER — Other Ambulatory Visit: Payer: Self-pay | Admitting: Medical Oncology

## 2012-06-26 DIAGNOSIS — C3490 Malignant neoplasm of unspecified part of unspecified bronchus or lung: Secondary | ICD-10-CM

## 2012-06-26 NOTE — Progress Notes (Signed)
Lab orders entered

## 2012-06-27 ENCOUNTER — Other Ambulatory Visit (HOSPITAL_BASED_OUTPATIENT_CLINIC_OR_DEPARTMENT_OTHER): Payer: BC Managed Care – PPO | Admitting: Lab

## 2012-06-27 ENCOUNTER — Ambulatory Visit (HOSPITAL_BASED_OUTPATIENT_CLINIC_OR_DEPARTMENT_OTHER): Payer: BC Managed Care – PPO

## 2012-06-27 ENCOUNTER — Other Ambulatory Visit: Payer: Self-pay | Admitting: Radiology

## 2012-06-27 ENCOUNTER — Ambulatory Visit (HOSPITAL_BASED_OUTPATIENT_CLINIC_OR_DEPARTMENT_OTHER): Payer: BC Managed Care – PPO | Admitting: Internal Medicine

## 2012-06-27 ENCOUNTER — Telehealth: Payer: Self-pay | Admitting: Internal Medicine

## 2012-06-27 VITALS — BP 129/79 | HR 65 | Temp 98.1°F | Resp 18 | Ht 69.0 in | Wt 159.4 lb

## 2012-06-27 DIAGNOSIS — R599 Enlarged lymph nodes, unspecified: Secondary | ICD-10-CM

## 2012-06-27 DIAGNOSIS — C3490 Malignant neoplasm of unspecified part of unspecified bronchus or lung: Secondary | ICD-10-CM

## 2012-06-27 DIAGNOSIS — C3400 Malignant neoplasm of unspecified main bronchus: Secondary | ICD-10-CM

## 2012-06-27 DIAGNOSIS — C7970 Secondary malignant neoplasm of unspecified adrenal gland: Secondary | ICD-10-CM

## 2012-06-27 DIAGNOSIS — C3410 Malignant neoplasm of upper lobe, unspecified bronchus or lung: Secondary | ICD-10-CM

## 2012-06-27 DIAGNOSIS — J449 Chronic obstructive pulmonary disease, unspecified: Secondary | ICD-10-CM

## 2012-06-27 LAB — CBC WITH DIFFERENTIAL/PLATELET
BASO%: 0.4 % (ref 0.0–2.0)
Basophils Absolute: 0 10*3/uL (ref 0.0–0.1)
HCT: 41.3 % (ref 38.4–49.9)
HGB: 14.4 g/dL (ref 13.0–17.1)
LYMPH%: 19.9 % (ref 14.0–49.0)
MCHC: 34.8 g/dL (ref 32.0–36.0)
MONO#: 0.7 10*3/uL (ref 0.1–0.9)
NEUT%: 70 % (ref 39.0–75.0)
Platelets: 197 10*3/uL (ref 140–400)
WBC: 8.3 10*3/uL (ref 4.0–10.3)

## 2012-06-27 LAB — COMPREHENSIVE METABOLIC PANEL
BUN: 17 mg/dL (ref 6–23)
CO2: 28 mEq/L (ref 19–32)
Creatinine, Ser: 1.06 mg/dL (ref 0.50–1.35)
Glucose, Bld: 95 mg/dL (ref 70–99)
Total Bilirubin: 0.5 mg/dL (ref 0.3–1.2)

## 2012-06-27 MED ORDER — PROCHLORPERAZINE MALEATE 10 MG PO TABS: 10.0000 mg | ORAL_TABLET | Freq: Four times a day (QID) | ORAL | Status: DC | PRN

## 2012-06-27 MED ORDER — LIDOCAINE-PRILOCAINE 2.5-2.5 % EX CREA: TOPICAL_CREAM | CUTANEOUS | Status: DC | PRN

## 2012-06-27 NOTE — Telephone Encounter (Signed)
Gave pt appt for chemo class, portacath, lab and MD, appt moved to MD due to late availability of ML. Pt been instructed to be NPO for the portacath appt.

## 2012-06-27 NOTE — Progress Notes (Signed)
[facility] Telephone:([phone]   [phone]  CONSULT NOTE  REASON FOR CONSULTATION:  61 years old white male diagnosed with lung cancer.  HPI Derrick Crosby is a 61 y.o. male past medical history significant for COPD, hypertension, dyslipidemia, anxiety, depression, history of skin cancer and long history of smoking. The patient mentions that in late July of 2013 he was seen by his primary care physician complaining of 2 days of cough and hemoptysis. CT scan of the chest was performed at [facility] in [place] and it showed large central left hilar mass with direct extension into the posterior mediastinum in addition to enlarged AP window and subcarinal lymph nodes. The patient was referred to  Dr. Marin Shutter. On 06/13/2012, he underwent flexible bronchoscopy, endobronchial biopsy of the left mainstem bronchus lesion as well as endobronchial ultrasound and transbronchial needle aspiration of the left hilar mass. The final pathology showed malignant cells. The specimen contains individuals and groups of highly atypical The the immunohistochemical stains show focal P63 staining and are negative for CD 56. The negative CD 56 stain argues against small cell carcinoma. The focally staining  For P63 is suggestive of squamous differentiation at least focally. A PET scan on 06/16/2012 showed Large hypermetabolic central left hilar mass with medial mediastinal invasion and a pathologic hypermetabolic prevascular lymph node. Bilateral adrenal metastatic lesions. MRI of the brain on 06/22/2012 showed a post contrast images degraded by motion despite repeated imaging attempts. No acute or metastatic intracranial abnormalities identified. Dr. Marin Shutter kindly referred the patient to me today for evaluation and recommendation regarding treatment of his condition. The patient is feeling fine today with no specific complaints except for occasional chest pain and back pain.  He denied having any current hemoptysis, no shortness of breath. He has no weight loss or night sweats and no visual changes. He continues to have intermittent headache but he has it for years.  He has a sister with cancer of unknown type. His mother and father is still alive. The patient is married and has one daughter. He was accompanied by his wife [name] and his daughter Derrick Crosby. He has a history of smoking up to one and half pack per day for around 45 years, quit 2 weeks ago. No history of alcohol or drug abuse.   @SFHPI @  Past Medical History  Diagnosis Date  . Hypertension   . Hyperglyceridemia, pure   . COPD (chronic obstructive pulmonary disease)   . Cancer     Skin  Cancer - Left ear.-  . Hemoptysis   . Skin cancer     left ear  . Anxiety   . Depression   . Lung cancer 06/13/12    lung mass /right hilar suspicious for primary bronchogenic ca  . Hemoptysis   . Seasonal allergies     Past Surgical History  Procedure Date  . No past surgeries   . Tympanoplasty   . Video bronchoscopy 06/13/12    with endobronchial u/s/ with biospy left hilar mass/Dr. Lonia Farber  . Tonsillectomy     child    Family History  Problem Relation Age of Onset  . Lung cancer Mother   . Cancer Mother     lung former smoker, age 56 now    Social History History  Substance Use Topics  . Smoking status: Former Smoker -- 0.5 packs/day for 50 years    Types: Cigarettes    Quit date: 06/14/2012  . Smokeless tobacco: Never Used  Comment: pt trying to quit  . Alcohol Use: Yes     occasionally beer     No Known Allergies  Current Outpatient Prescriptions  Medication Sig Dispense Refill  . acetaminophen (TYLENOL) 325 MG tablet Take 325 mg by mouth every 6 (six) hours as needed. Takes 2 prn  General aches      . albuterol (PROVENTIL HFA;VENTOLIN HFA) 108 (90 BASE) MCG/ACT inhaler Inhale 2 puffs into the lungs every 6 (six) hours as needed for wheezing.  1 Inhaler  6  . aspirin  81 MG chewable tablet Chew 81 mg by mouth daily.      . citalopram (CELEXA) 20 MG tablet Take 20 mg by mouth daily.      . fish oil-omega-3 fatty acids 1000 MG capsule Take 1,200 mg by mouth daily.      . Multiple Vitamins-Minerals (MULTIVITAMIN WITH MINERALS) tablet Take 1 tablet by mouth daily.      Marland Kitchen triamcinolone cream (KENALOG) 0.1 % Apply topically 2 (two) times daily.      . clonazePAM (KLONOPIN) 0.5 MG tablet Take 0.5 mg by mouth 2 (two) times daily as needed.      . lidocaine-prilocaine (EMLA) cream Apply topically as needed. Apply to [facility] A cath site 30-60 min before chemotherapy  30 g  0  . prochlorperazine (COMPAZINE) 10 MG tablet Take 1 tablet (10 mg total) by mouth every 6 (six) hours as needed.  60 tablet  0    Review of Systems  A comprehensive review of systems was negative except for: Constitutional: positive for fatigue Respiratory: positive for pleurisy/chest pain Neurological: positive for headaches  Physical Exam  WUJ:WJXBJ, healthy, no distress, well nourished and well developed SKIN: skin color, texture, turgor are normal HEAD: Normocephalic EYES: normal, PERRLA EARS: External ears normal OROPHARYNX:no exudate and no erythema  NECK: supple, no adenopathy LYMPH:  no palpable lymphadenopathy, no hepatosplenomegaly LUNGS: clear to auscultation  HEART: regular rate & rhythm, no murmurs and no gallops ABDOMEN:abdomen soft, non-tender, normal bowel sounds and no masses or organomegaly BACK: Back symmetric, no curvature. EXTREMITIES:no joint deformities, effusion, or inflammation, no edema, no skin discoloration, no clubbing, no cyanosis  NEURO: alert & oriented x 3 with fluent speech, no focal motor/sensory deficits  PERFORMANCE STATUS: ECOG 1  LABORATORY DATA: Lab Results  Component Value Date   WBC 8.3 06/27/2012   HGB 14.4 06/27/2012   HCT 41.3 06/27/2012   MCV 91.1 06/27/2012   PLT 197 06/27/2012      Chemistry      Component Value Date/Time   NA 137  06/13/2012 0705   K 4.2 06/13/2012 0705   CL 102 06/13/2012 0705   CO2 25 06/13/2012 0705   BUN 18 06/13/2012 0705   CREATININE 0.98 06/13/2012 0705      Component Value Date/Time   CALCIUM 9.7 06/13/2012 0705   ALKPHOS 130* 06/13/2012 0705   AST 19 06/13/2012 0705   ALT 18 06/13/2012 0705   BILITOT 0.4 06/13/2012 0705       RADIOGRAPHIC STUDIES: Mr Laqueta Jean Wo Contrast  June 29, 2012  *RADIOLOGY REPORT*  Clinical Data: 61 year old male with new diagnosis of lung cancer. Staging.  Uncontrollable cough.  MRI HEAD WITHOUT AND WITH CONTRAST  Technique:  Multiplanar, multiecho pulse sequences of the brain and surrounding structures were obtained according to standard protocol without and with intravenous contrast  Contrast: 15mL MULTIHANCE GADOBENATE DIMEGLUMINE 529 MG/ML IV SOLN  Comparison: None.  Findings: Motion artifact on postcontrast images. No abnormal  enhancement identified.  No restricted diffusion to suggest acute infarction.  No midline shift, mass effect, evidence of mass lesion, ventriculomegaly, extra-axial collection or acute intracranial hemorrhage.  Cervicomedullary junction and pituitary are within normal limits.  Major intracranial vascular flow voids are preserved. Wallace Cullens and white matter signal is within normal limits throughout the brain.  Grossly negative visualized cervical spine.  Visualized bone marrow signal is within normal limits.  Visualized orbit soft tissues are within normal limits.  Visualized paranasal sinuses and mastoids are clear.  Negative scalp soft tissues.  IMPRESSION: Postcontrast images degraded by motion despite repeated imaging attempts. No acute or metastatic intracranial abnormality identified.  Original Report Authenticated By: Harley Hallmark, M.D.   Nm Pet Image Initial (pi) Skull Base To Thigh  06/16/2012  *RADIOLOGY REPORT*  Clinical Data: Initial treatment strategy for lung cancer.  NUCLEAR MEDICINE PET SKULL BASE TO THIGH  Fasting Blood Glucose:  92  Technique:  19.4 mCi  F-18 FDG was injected intravenously. CT data was obtained and used for attenuation correction and anatomic localization only.  (This was not acquired as a diagnostic CT examination.) Additional exam technical data entered on technologist worksheet.  Comparison:  08/07/2012  Findings:  Neck: No hypermetabolic lymph nodes in the neck.  Chest:  Large central left lung mass along the minor fissure involves the upper lobe, lower lobe, and middle mediastinum, with maximum standard uptake value of 17.6.  Small prevascular node measuring 9 mm in short axis diameter has a maximum standard uptake value of 6.1.  Emphysema is present.  Postobstructive pneumonitis noted in the left lower lobe, with complete collapse of the left lower lobe bronchus due to the dominant mass.  Abdomen/Pelvis:  1.3 cm right adrenal mass has a maximum standard uptake value of 11.8, compatible with metastatic disease.  A 1.4 cm left adrenal mass has a maximum standard uptake value of 11.7 cm, compatible with malignancy.  Small gastric diverticulum noted posteriorly.  Aortoiliac atherosclerotic calcification noted.  A small umbilical hernia contains adipose tissue.  High activity along the descending colon is most likely physiologic.  Bilateral nonobstructive nephrolithiasis noted.  Several hypodense liver lesions are probably cysts although are technically nonspecific.  No definite hypermetabolic activity in the liver is observed.  [place]:  No focal hypermetabolic activity to suggest skeletal metastasis.  IMPRESSION:  1.  Large hypermetabolic central left hilar mass with medial mediastinal invasion and a pathologic hypermetabolic prevascular lymph node. 2.  Bilateral adrenal metastatic lesions. 3.  Bilateral nonobstructive nephrolithiasis.  Original Report Authenticated By: [name], M.D.    ASSESSMENT: This is a very pleasant 61 years old white male recently diagnosed with a stage IV non-small cell lung cancer, squamous cell  carcinoma, with large left hilar mass, mediastinal lymphadenopathy as well as bilateral adrenal metastasis.  PLAN: I have a lengthy discussion with the patient and his family about his disease stage, prognosis and treatment options. I recommended for the patient the following: #1 I recommend for the patient to proceed with a course of palliative radiotherapy to the left hilar mass under the care of Dr. Mitzi Hansen as previously scheduled because of his history of hemoptysis. #2 I discussed with the patient's systemic chemotherapy and give him the option of treatment with carboplatin and Abraxane. Carboplatin will be for AUC of 6 on day 1 and Abraxane 100 mg/M2 on days 1, 8 and 15 every 3 weeks. I discussed with the patient adverse effect of this treatment including but not limited to alopecia,  myelosuppression, peripheral neuropathy, nausea and vomiting, liver or renal dysfunction. The patient would like to proceed with treatment as planned. He would have a chemotherapy education class this week. He is expected to start the first cycle of this treatment next week. I sent prescription to his pharmacy including Compazine 10 mg by mouth every 6 hours as needed for nausea in addition to Emla cream to be applied to the Port-A-Cath site before his chemotherapy. #3 I would refer the patient to interventional radiology for consideration of Port-A-Cath placement before the first cycle of his treatment. #4 the patient would come back for followup visit in 2 weeks for evaluation and management any adverse effect of his chemotherapy. I gave the patient and his family the time to ask questions and I answered them completely to their satisfaction. The patient was advised to call me immediately if he has any concerning symptoms in the interval. All questions were answered. The patient knows to call the clinic with any problems, questions or concerns. We can certainly see the patient much sooner if necessary.  Thank you so  much for allowing me to participate in the care of Emmons Lawrence. I will continue to follow up the patient with you and assist in his care.  I spent 30 minutes counseling the patient face to face. The total time spent in the appointment was 60 minutes.  Derin Granquist K. 06/27/2012, 11:21 AM

## 2012-06-28 ENCOUNTER — Telehealth: Payer: Self-pay | Admitting: Pulmonary Disease

## 2012-06-28 ENCOUNTER — Encounter (HOSPITAL_COMMUNITY): Payer: Self-pay | Admitting: Pharmacy Technician

## 2012-06-28 NOTE — Telephone Encounter (Signed)
Mindy, have you seen any forms on this pt? Please advise and if not will have them re fax, thanks!

## 2012-06-29 ENCOUNTER — Ambulatory Visit
Admission: RE | Admit: 2012-06-29 | Discharge: 2012-06-29 | Disposition: A | Discharge status: Home / Self Care | Payer: BC Managed Care – PPO | Source: Ambulatory Visit | Attending: Radiation Oncology | Admitting: Radiation Oncology

## 2012-06-29 DIAGNOSIS — C3400 Malignant neoplasm of unspecified main bronchus: Secondary | ICD-10-CM

## 2012-06-29 NOTE — Progress Notes (Signed)
  Radiation Oncology         [phone] ________________________________  Name: Derrick Crosby MRN: [id number]  Date: 06/29/2012  DOB: 1951/02/27  SIMULATION AND TREATMENT PLANNING NOTE  DIAGNOSIS:  Lung cancer  NARRATIVE:  The patient was brought to the CT Simulation planning suite.  Identity was confirmed.  All relevant records and images related to the planned course of therapy were reviewed.   Written consent to proceed with treatment was confirmed which was freely given after reviewing the details related to the planned course of therapy had been reviewed with the patient.  Then, the patient was set-up in a stable reproducible supine position for radiation therapy.  The patient's arms were raised above using a wing board device. CT images were obtained.  An isocenter was placed within the chest in relation to the target volume. Surface markings were placed.    The CT images were loaded into the planning software.  Then the target and avoidance structures were contoured.  Treatment planning then occurred.  The radiation prescription was entered and confirmed.  A total of 5 complex treatment devices were fabricated which correspond to the designed customized radiation treatment fields. Each of these customized fields/ complex treatment devices will be used on a daily basis during the radiation course. I have requested a 3D Simulation.  I have requested a DVH of the following structures: target volume, spinal cord, lungs, esophagus.   Daily image guidance will be utilized using cone-beam CT imaging. This will be necessary to ensure accurate localization of the target as well as adequate sparing of the normal structures in this region including most prominently the spinal cord and esophagus.  PLAN:  The patient will initially receive 37.5 Gy in 15 fractions.  The patient will receive chemotherapy during the course of radiation treatment. The patient may experience increased or overlapping  toxicity due to this combined-modality approach and the patient will be monitored for such problems. This may include extra lab work as necessary. This therefore constitutes a special treatment procedure.   ________________________________

## 2012-06-29 NOTE — Telephone Encounter (Signed)
I have fax and will give to dr. Herma Carson to fill out when he returns tomorrow and will fax back

## 2012-06-30 ENCOUNTER — Encounter (HOSPITAL_COMMUNITY): Payer: Self-pay

## 2012-06-30 ENCOUNTER — Ambulatory Visit (HOSPITAL_COMMUNITY)
Admission: RE | Admit: 2012-06-30 | Discharge: 2012-06-30 | Disposition: A | Discharge status: Home / Self Care | Payer: BC Managed Care – PPO | Source: Ambulatory Visit | Attending: Internal Medicine | Admitting: Internal Medicine

## 2012-06-30 ENCOUNTER — Other Ambulatory Visit: Payer: Self-pay | Admitting: Internal Medicine

## 2012-06-30 DIAGNOSIS — C3400 Malignant neoplasm of unspecified main bronchus: Secondary | ICD-10-CM

## 2012-06-30 DIAGNOSIS — J449 Chronic obstructive pulmonary disease, unspecified: Secondary | ICD-10-CM | POA: Insufficient documentation

## 2012-06-30 DIAGNOSIS — C3490 Malignant neoplasm of unspecified part of unspecified bronchus or lung: Secondary | ICD-10-CM | POA: Insufficient documentation

## 2012-06-30 DIAGNOSIS — J4489 Other specified chronic obstructive pulmonary disease: Secondary | ICD-10-CM | POA: Insufficient documentation

## 2012-06-30 DIAGNOSIS — I10 Essential (primary) hypertension: Secondary | ICD-10-CM | POA: Insufficient documentation

## 2012-06-30 MED ORDER — HEPARIN SOD (PORK) LOCK FLUSH 100 UNIT/ML IV SOLN
500.0000 [IU] | Freq: Once | INTRAVENOUS | Status: AC
  Administered 2012-06-30: 500 [IU] via INTRAVENOUS

## 2012-06-30 MED ORDER — CEFAZOLIN SODIUM 1-5 GM-% IV SOLN
1.0000 g | INTRAVENOUS | Status: AC
Start: 2012-06-30 — End: 2012-06-30
  Administered 2012-06-30: 1 g via INTRAVENOUS
  Filled 2012-06-30: qty 50

## 2012-06-30 MED ORDER — LIDOCAINE HCL 1 % IJ SOLN
INTRAMUSCULAR | Status: AC
  Filled 2012-06-30: qty 20

## 2012-06-30 MED ORDER — SODIUM CHLORIDE 0.9 % IV SOLN
Freq: Once | INTRAVENOUS | Status: AC
  Administered 2012-06-30: 09:00:00 via INTRAVENOUS

## 2012-06-30 MED ORDER — FENTANYL CITRATE 0.05 MG/ML IJ SOLN
INTRAMUSCULAR | Status: AC | PRN
  Administered 2012-06-30 (×2): 100 ug via INTRAVENOUS

## 2012-06-30 MED ORDER — MIDAZOLAM HCL 5 MG/5ML IJ SOLN
INTRAMUSCULAR | Status: AC | PRN
  Administered 2012-06-30: 2 mg via INTRAVENOUS

## 2012-06-30 MED ORDER — MIDAZOLAM HCL 2 MG/2ML IJ SOLN
INTRAMUSCULAR | Status: AC
  Filled 2012-06-30: qty 4

## 2012-06-30 MED ORDER — FENTANYL CITRATE 0.05 MG/ML IJ SOLN
INTRAMUSCULAR | Status: AC
  Filled 2012-06-30: qty 6

## 2012-06-30 NOTE — Telephone Encounter (Signed)
I have faxed forms back

## 2012-06-30 NOTE — H&P (Signed)
Chief Complaint: "I'm here for a portacath" Referring Physician:Mohamed HPI: Derrick Crosby is an 61 y.o. male who was recently diagnosed with lung cancer. He is set up for both radiation therapy and to start chemotherapy. He is referred for portacath placement. PMHx reviewed, he otherwise feels fine with no c/o Denies recent infections, illnesses.  Past Medical History:  Past Medical History  Diagnosis Date  . Hypertension   . Hyperglyceridemia, pure   . COPD (chronic obstructive pulmonary disease)   . Cancer     Skin  Cancer - Left ear.-  . Hemoptysis   . Skin cancer     left ear  . Anxiety   . Depression   . Lung cancer 06/13/12    lung mass /right hilar suspicious for primary bronchogenic ca  . Hemoptysis   . Seasonal allergies     Past Surgical History:  Past Surgical History  Procedure Date  . No past surgeries   . Tympanoplasty   . Video bronchoscopy 06/13/12    with endobronchial u/s/ with biospy left hilar mass/Dr. Lonia Farber  . Tonsillectomy     child    Family History:  Family History  Problem Relation Age of Onset  . Lung cancer Mother   . Cancer Mother     lung former smoker, age 16 now    Social History:  reports that he quit smoking about 2 weeks ago. His smoking use included Cigarettes. He has a 25 pack-year smoking history. He has never used smokeless tobacco. He reports that he drinks alcohol. He reports that he does not use illicit drugs.  Allergies: No Known Allergies  Medications: acetaminophen (TYLENOL) 325 MG tablet (Taking) Sig - Route: Take 325-650 mg by mouth every 6 (six) hours as needed. For general aches - Oral Class: Historical Med Number of times this order has been changed since signing: 3 [address] albuterol (PROVENTIL HFA;VENTOLIN HFA) 108 (90 BASE) MCG/ACT inhaler (Taking) 1 Inhaler 6 06/16/2012 06/16/2013 Sig - Route: Inhale 2 puffs into the lungs every 6 (six) hours as needed for wheezing. - Inhalation Number of times  this order has been changed since signing: 2 Order [address] citalopram (CELEXA) 20 MG tablet (Taking) Sig - Route: Take 20 mg by mouth every evening. - Oral Class: Historical Med Number of times this order has been changed since signing: 3 Order [address] clonazePAM (KLONOPIN) 0.5 MG tablet (Taking) Sig - Route: Take 0.5 mg by mouth 2 (two) times daily as needed. For anxiety - Oral Class: Historical Med Number of times this order has been changed since signing: 2 Order [address] fish oil-omega-3 fatty acids 1000 MG capsule (Taking) Sig - Route: Take 1 g by mouth daily. - Oral Class: Historical Med Number of times this order has been changed since signing: 3 Order [address] loratadine (CLARITIN) 10 MG tablet (Taking) Sig - Route: Take 10 mg by mouth daily. - Oral Class: Historical Med Number of times this order has been changed since signing: 1 Order [address] triamcinolone cream (KENALOG) 0.1 % (Taking) Sig - Route: Apply topically 2 (two) times daily. - Topical Class: Historical Med Number of times this order has been changed since signing: 1 Order [address] aspirin 81 MG chewable tablet Sig - Route: Chew 81 mg by mouth daily. - Oral Class: Historical Med Number of times this order has been changed since signing: 1 Order [address] lidocaine-prilocaine (EMLA) cream 30 g 0 06/27/2012 06/27/2013 Sig - Route: Apply topically as  needed. Apply to [facility] A cath site 30-60 min before chemotherapy - Topical Number of times this order has been changed since signing: 2 Order [address] Multiple Vitamins-Minerals (MULTIVITAMIN WITH MINERALS) tablet Sig - Route: Take 1 tablet by mouth daily. - Oral Class: Historical Med Number of times this order has been changed since signing: 1 Order [address] prochlorperazine (COMPAZINE) 10 MG tablet 60 tablet 0 06/27/2012 07/04/2012 Sig - Route: Take 1 tablet (10 mg total) by mouth every 6 (six) hours as needed. -    Please HPI for pertinent positives, otherwise complete 10  system ROS negative.  Physical Exam: Blood pressure 125/80, pulse 62, temperature 97.5 F (36.4 C), resp. rate 18, SpO2 96.00%. There is no height or weight on file to calculate BMI.   General Appearance:  Alert, cooperative, no distress, appears stated age  Head:  Normocephalic, without obvious abnormality, atraumatic  ENT: Unremarkable  Neck: Supple, symmetrical, trachea midline, no adenopathy, thyroid: not enlarged, symmetric, no tenderness/mass/nodules  Lungs:   Clear to auscultation bilaterally, few rhonchi  Chest Wall:  No tenderness or deformity.  Heart:  Regular rate and rhythm, S1, S2 normal, no murmur, rub or gallop. Carotids 2+ without bruit.  Abdomen:   Soft, non-tender, non distended. Bowel sounds active all four quadrants,  no masses, no organomegaly.  Neurologic: Normal affect, no gross deficits.   Labs: CBC    Component Value Date/Time   WBC 8.3 06/27/2012 0904        RBC 4.53 06/27/2012 0904        HGB 14.4 06/27/2012 0904        HCT 41.3 06/27/2012 0904        PLT 197 06/27/2012 0904   BMET    Component Value Date/Time   NA 141 06/27/2012 0904   K 4.4 06/27/2012 0904   CL 102 06/27/2012 0904   CO2 28 06/27/2012 0904   GLUCOSE 95 06/27/2012 0904   BUN 17 06/27/2012 0904   CREATININE 1.06 06/27/2012 0904   CALCIUM 9.5 06/27/2012 0904   GFRNONAA 86* 06/13/2012 0705   GFRAA >90 06/13/2012 0705   Prothrombin Time/INR 13.4/1.0  Assessment/Plan Lung cancer Discussed procedure of port placement with pt in detail, including risks and complications. Discussed use of sedation including risks. Labs reviewed and ok. Consent signed.  Brayton El PA-C 06/30/2012, 8:21 AM

## 2012-06-30 NOTE — Procedures (Signed)
RIJV PAC insertion. TIp SVC RA. No comp

## 2012-07-04 ENCOUNTER — Other Ambulatory Visit: Payer: BC Managed Care – PPO

## 2012-07-04 ENCOUNTER — Encounter: Payer: Self-pay | Admitting: *Deleted

## 2012-07-05 ENCOUNTER — Telehealth: Payer: Self-pay | Admitting: Medical Oncology

## 2012-07-05 ENCOUNTER — Telehealth: Payer: Self-pay | Admitting: *Deleted

## 2012-07-05 ENCOUNTER — Other Ambulatory Visit: Payer: Self-pay | Admitting: Internal Medicine

## 2012-07-05 ENCOUNTER — Ambulatory Visit (HOSPITAL_BASED_OUTPATIENT_CLINIC_OR_DEPARTMENT_OTHER): Payer: BC Managed Care – PPO

## 2012-07-05 ENCOUNTER — Ambulatory Visit
Admission: RE | Admit: 2012-07-05 | Discharge: 2012-07-05 | Disposition: A | Discharge status: Home / Self Care | Payer: BC Managed Care – PPO | Source: Ambulatory Visit | Attending: Radiation Oncology | Admitting: Radiation Oncology

## 2012-07-05 ENCOUNTER — Other Ambulatory Visit (HOSPITAL_BASED_OUTPATIENT_CLINIC_OR_DEPARTMENT_OTHER): Payer: BC Managed Care – PPO | Admitting: Lab

## 2012-07-05 VITALS — BP 132/79 | HR 72 | Temp 98.1°F

## 2012-07-05 DIAGNOSIS — Z5111 Encounter for antineoplastic chemotherapy: Secondary | ICD-10-CM

## 2012-07-05 DIAGNOSIS — C3400 Malignant neoplasm of unspecified main bronchus: Secondary | ICD-10-CM

## 2012-07-05 DIAGNOSIS — C3490 Malignant neoplasm of unspecified part of unspecified bronchus or lung: Secondary | ICD-10-CM

## 2012-07-05 DIAGNOSIS — C3410 Malignant neoplasm of upper lobe, unspecified bronchus or lung: Secondary | ICD-10-CM

## 2012-07-05 DIAGNOSIS — C7970 Secondary malignant neoplasm of unspecified adrenal gland: Secondary | ICD-10-CM

## 2012-07-05 LAB — CBC WITH DIFFERENTIAL/PLATELET
Basophils Absolute: 0 10*3/uL (ref 0.0–0.1)
Eosinophils Absolute: 0.1 10*3/uL (ref 0.0–0.5)
HGB: 14.1 g/dL (ref 13.0–17.1)
MCV: 86.1 fL (ref 79.3–98.0)
MONO#: 0.7 10*3/uL (ref 0.1–0.9)
MONO%: 8.7 % (ref 0.0–14.0)
NEUT#: 5.3 10*3/uL (ref 1.5–6.5)
RDW: 12.9 % (ref 11.0–14.6)
WBC: 8.3 10*3/uL (ref 4.0–10.3)
lymph#: 2.1 10*3/uL (ref 0.9–3.3)

## 2012-07-05 LAB — COMPREHENSIVE METABOLIC PANEL (CC13)
ALT: 30 U/L (ref 0–55)
Albumin: 3.9 g/dL (ref 3.5–5.0)
BUN: 19 mg/dL (ref 7.0–26.0)
CO2: 24 mEq/L (ref 22–29)
Calcium: 9.8 mg/dL (ref 8.4–10.4)
Chloride: 103 mEq/L (ref 98–107)
Glucose: 91 mg/dl (ref 70–99)
Potassium: 4.4 mEq/L (ref 3.5–5.1)
Sodium: 137 mEq/L (ref 136–145)

## 2012-07-05 MED ORDER — DEXAMETHASONE SODIUM PHOSPHATE 4 MG/ML IJ SOLN
20.0000 mg | Freq: Once | INTRAMUSCULAR | Status: AC
  Administered 2012-07-05: 20 mg via INTRAVENOUS

## 2012-07-05 MED ORDER — SODIUM CHLORIDE 0.9 % IJ SOLN
10.0000 mL | INTRAMUSCULAR | Status: DC | PRN
  Administered 2012-07-05: 10 mL
  Filled 2012-07-05: qty 10

## 2012-07-05 MED ORDER — ONDANSETRON 16 MG/50ML IVPB (CHCC)
16.0000 mg | Freq: Once | INTRAVENOUS | Status: AC
  Administered 2012-07-05: 16 mg via INTRAVENOUS

## 2012-07-05 MED ORDER — PACLITAXEL PROTEIN-BOUND CHEMO INJECTION 100 MG
100.0000 mg/m2 | Freq: Once | INTRAVENOUS | Status: AC
  Administered 2012-07-05: 200 mg via INTRAVENOUS
  Filled 2012-07-05: qty 40

## 2012-07-05 MED ORDER — SODIUM CHLORIDE 0.9 % IV SOLN
587.4000 mg | Freq: Once | INTRAVENOUS | Status: AC
  Administered 2012-07-05: 590 mg via INTRAVENOUS
  Filled 2012-07-05: qty 59

## 2012-07-05 MED ORDER — SODIUM CHLORIDE 0.9 % IV SOLN
Freq: Once | INTRAVENOUS | Status: AC
  Administered 2012-07-05: 09:00:00 via INTRAVENOUS

## 2012-07-05 MED ORDER — HEPARIN SOD (PORK) LOCK FLUSH 100 UNIT/ML IV SOLN
500.0000 [IU] | Freq: Once | INTRAVENOUS | Status: AC | PRN
  Administered 2012-07-05: 500 [IU]
  Filled 2012-07-05: qty 5

## 2012-07-05 NOTE — Telephone Encounter (Signed)
Patient call returned  Patient had chemotherapy today and rad tx, when he got home c/o heartburn, asked if he could take tums"? Yes take 1-2 to see if that helps, may need rx ,if still having symptoms tomorrow after rad tx come see Dr.Moody,patient wife  Thanked this Rn for the call back 4:03 PM

## 2012-07-05 NOTE — Patient Instructions (Signed)
[facility] Discharge Instructions for Patients Receiving Chemotherapy  Today you received the following chemotherapy agents Abraxane/Carboplatin To help prevent nausea and vomiting after your treatment, we encourage you to take your nausea medication as prescribed.If you develop nausea and vomiting that is not controlled by your nausea medication, call the clinic. If it is after clinic hours your family physician or the after hours number for the clinic or go to the Emergency Department.   BELOW ARE SYMPTOMS THAT SHOULD BE REPORTED IMMEDIATELY:  *FEVER GREATER THAN 100.5 F  *CHILLS WITH OR WITHOUT FEVER  NAUSEA AND VOMITING THAT IS NOT CONTROLLED WITH YOUR NAUSEA MEDICATION  *UNUSUAL SHORTNESS OF BREATH  *UNUSUAL BRUISING OR BLEEDING  TENDERNESS IN MOUTH AND THROAT WITH OR WITHOUT PRESENCE OF ULCERS  *URINARY PROBLEMS  *BOWEL PROBLEMS  UNUSUAL RASH Items with * indicate a potential emergency and should be followed up as soon as possible.  One of the nurses will contact you 24 hours after your treatment. Please let the nurse know about any problems that you may have experienced. Feel free to call the clinic you have any questions or concerns. The clinic phone number is [phone].   I have been informed and understand all the instructions given to me. I know to contact the clinic, my physician, or go to the Emergency Department if any problems should occur. I do not have any questions at this time, but understand that I may call the clinic during office hours   should I have any questions or need assistance in obtaining follow up care.    __________________________________________  _____________  __________ Signature of Patient or Authorized Representative            Date                   Time    __________________________________________ Nurse's Signature    

## 2012-07-05 NOTE — Telephone Encounter (Signed)
Wife wants to know what he can take for headache and the book they have said to call if pt has well water. Pt has appointments today so I will talk to pt.

## 2012-07-06 ENCOUNTER — Encounter: Payer: Self-pay | Admitting: Radiation Oncology

## 2012-07-06 ENCOUNTER — Ambulatory Visit
Admission: RE | Admit: 2012-07-06 | Discharge: 2012-07-06 | Disposition: A | Discharge status: Home / Self Care | Payer: BC Managed Care – PPO | Source: Ambulatory Visit | Attending: Radiation Oncology | Admitting: Radiation Oncology

## 2012-07-06 ENCOUNTER — Telehealth: Payer: Self-pay | Admitting: *Deleted

## 2012-07-06 VITALS — BP 148/77 | HR 69 | Temp 97.6°F | Resp 20 | Wt 159.6 lb

## 2012-07-06 DIAGNOSIS — C3490 Malignant neoplasm of unspecified part of unspecified bronchus or lung: Secondary | ICD-10-CM

## 2012-07-06 DIAGNOSIS — C3400 Malignant neoplasm of unspecified main bronchus: Secondary | ICD-10-CM

## 2012-07-06 MED ORDER — RADIAPLEXRX EX GEL
Freq: Once | CUTANEOUS | Status: AC
  Administered 2012-07-06: 10:00:00 via TOPICAL

## 2012-07-06 NOTE — Progress Notes (Signed)
Patient alert,oriented x3, post simmed ,radiation there apy and you book, schedule,business card of mine given to patient, he has completed 2/15 rad txs, patient had coffee this am, and boiled egg, he threw up and then was okay ,coughed up a white glob, had chemotherapy  Yesterday, oxygen 98% room air, patient needs reinforce ment of side effects, sign /symptoms to report  no c/o pain 9:45 AM

## 2012-07-06 NOTE — Telephone Encounter (Signed)
Wife returned call and said pt vomited x 1 today after drinking coffee , but is doing good now. I called pt back and spoke to wife -p[t drank milk and di not have any nausea or vomiting. I told her to give him nausea med if he feels the least amt of nausea-she voices understanding.

## 2012-07-06 NOTE — Telephone Encounter (Signed)
Message copied by Augusto Garbe on Thu Jul 06, 2012 12:15 PM ------      Message from: Corky Sing      Created: Wed Jul 05, 2012 11:10 AM      Regarding: Chemo Follow up call       Patient received Abraxane/Carboplatin. Dr. Arbutus Ped patient.  Please call.       Thanks,      Santina Evans

## 2012-07-06 NOTE — Telephone Encounter (Signed)
Called patient for f/u.  No answer so message left on answering machine requesting a return call for f/u.  Awaiting return call from patient.

## 2012-07-06 NOTE — Progress Notes (Signed)
Department of Radiation Oncology  Phone:  [phone] Fax:        [phone]  Weekly Treatment Note    Name: Derrick Crosby Date: 07/06/2012 MRN: [id number] DOB: 1950/12/06   Current dose: 5 Gy  Current fraction: 2   MEDICATIONS: Current Outpatient Prescriptions  Medication Sig Dispense Refill  . acetaminophen (TYLENOL) 325 MG tablet Take 325-650 mg by mouth every 6 (six) hours as needed. For general aches      . albuterol (PROVENTIL HFA;VENTOLIN HFA) 108 (90 BASE) MCG/ACT inhaler Inhale 2 puffs into the lungs every 6 (six) hours as needed for wheezing.  1 Inhaler  6  . citalopram (CELEXA) 20 MG tablet Take 20 mg by mouth every evening.       . clonazePAM (KLONOPIN) 0.5 MG tablet Take 0.5 mg by mouth 2 (two) times daily as needed. For anxiety      . lidocaine-prilocaine (EMLA) cream Apply topically as needed. Apply to [facility] A cath site 30-60 min before chemotherapy  30 g  0  . Multiple Vitamins-Minerals (MULTIVITAMIN WITH MINERALS) tablet Take 1 tablet by mouth daily.      Marland Kitchen triamcinolone cream (KENALOG) 0.1 % Apply topically 2 (two) times daily.      Marland Kitchen aspirin 81 MG chewable tablet Chew 81 mg by mouth daily.      . fish oil-omega-3 fatty acids 1000 MG capsule Take 1 g by mouth daily.       Marland Kitchen loratadine (CLARITIN) 10 MG tablet Take 10 mg by mouth daily.      . prochlorperazine (COMPAZINE) 10 MG tablet Take 1 tablet (10 mg total) by mouth every 6 (six) hours as needed.  60 tablet  0   Current Facility-Administered Medications  Medication Dose Route Frequency Provider Last Rate Last Dose  . hyaluronate sodium (RADIAPLEXRX) gel   Topical Once Jonna Coup, MD       Facility-Administered Medications Ordered in Other Encounters  Medication Dose Route Frequency Provider Last Rate Last Dose  . 0.9 %  sodium chloride infusion   Intravenous Once [name], MD      . CARBOplatin (PARAPLATIN) 590 mg in sodium chloride 0.9 % 250 mL chemo infusion  590 mg Intravenous Once  [name], MD   590 mg at 07/05/12 1041  . heparin lock flush 100 unit/mL  500 Units Intracatheter Once PRN [name], MD   500 Units at 07/05/12 1117  . PACLitaxel-protein bound (ABRAXANE) chemo infusion 200 mg  100 mg/m2 (Treatment Plan Actual) Intravenous Once [name], MD   200 mg at 07/05/12 0944  . DISCONTD: sodium chloride 0.9 % injection 10 mL  10 mL Intracatheter PRN [name], MD   10 mL at 07/05/12 1117     ALLERGIES: Review of patient's allergies indicates no known allergies.   LABORATORY DATA:  Lab Results  Component Value Date   WBC 8.3 07/05/2012   HGB 14.1 07/05/2012   HCT 39.5 07/05/2012   MCV 86.1 07/05/2012   PLT 185 07/05/2012   Lab Results  Component Value Date   NA 137 07/05/2012   K 4.4 07/05/2012   CL 103 07/05/2012   CO2 24 07/05/2012   Lab Results  Component Value Date   ALT 30 07/05/2012   AST 26 07/05/2012   ALKPHOS 166* 07/05/2012   BILITOT 0.80 07/05/2012     NARRATIVE: Derrick Crosby was seen today for weekly treatment management. The chart was checked and the patient's films were reviewed. The patient is  doing well. No problems with treatment today. He felt like he had a headache during treatment his first day. This resolved with his treatment. This is not in an area of treatment so we will follow this and he is to let us know if this worsens. He had some nausea today and does have medication for this. No further hemoptysis or change in breathing.  PHYSICAL EXAMINATION: weight is 159 lb 9.6 oz (72.394 kg). His oral temperature is 97.6 F (36.4 C). His blood pressure is 148/77 and his pulse is 69. His respiration is 20.        ASSESSMENT: The patient is doing satisfactorily with treatment.  PLAN: We will continue with the patient's radiation treatment as planned.

## 2012-07-07 ENCOUNTER — Ambulatory Visit
Admission: RE | Admit: 2012-07-07 | Discharge: 2012-07-07 | Disposition: A | Discharge status: Home / Self Care | Payer: BC Managed Care – PPO | Source: Ambulatory Visit | Attending: Radiation Oncology | Admitting: Radiation Oncology

## 2012-07-07 DIAGNOSIS — C3490 Malignant neoplasm of unspecified part of unspecified bronchus or lung: Secondary | ICD-10-CM

## 2012-07-07 NOTE — Progress Notes (Signed)
Patient seen , nervous about coughing up a little blood this am < teaspoon 1 time, after dry heaves this morning, took nausea medication,nausea subsided, , stated,, discussed with patient ,wife and son, side effects, only 3rd treatment today completed, has 3 days off, patient calmer after talking with him and family, suggested ginger root and make a tea with that, can help nausea and coughing, patient agreed, will try that ,and if he needs a medication for his nervousness will discuss with MD next week,informed  Dr, MOOdy 9:32 AM

## 2012-07-11 ENCOUNTER — Ambulatory Visit
Admission: RE | Admit: 2012-07-11 | Discharge: 2012-07-11 | Disposition: A | Discharge status: Home / Self Care | Payer: BC Managed Care – PPO | Source: Ambulatory Visit | Attending: Radiation Oncology | Admitting: Radiation Oncology

## 2012-07-12 ENCOUNTER — Ambulatory Visit: Payer: BC Managed Care – PPO | Admitting: Physician Assistant

## 2012-07-12 ENCOUNTER — Telehealth: Payer: Self-pay | Admitting: Internal Medicine

## 2012-07-12 ENCOUNTER — Telehealth: Payer: Self-pay | Admitting: *Deleted

## 2012-07-12 ENCOUNTER — Ambulatory Visit
Admission: RE | Admit: 2012-07-12 | Discharge: 2012-07-12 | Disposition: A | Discharge status: Home / Self Care | Payer: BC Managed Care – PPO | Source: Ambulatory Visit | Attending: Radiation Oncology | Admitting: Radiation Oncology

## 2012-07-12 ENCOUNTER — Other Ambulatory Visit (HOSPITAL_BASED_OUTPATIENT_CLINIC_OR_DEPARTMENT_OTHER): Payer: BC Managed Care – PPO | Admitting: Lab

## 2012-07-12 ENCOUNTER — Ambulatory Visit (HOSPITAL_BASED_OUTPATIENT_CLINIC_OR_DEPARTMENT_OTHER): Payer: BC Managed Care – PPO

## 2012-07-12 ENCOUNTER — Ambulatory Visit (HOSPITAL_BASED_OUTPATIENT_CLINIC_OR_DEPARTMENT_OTHER): Payer: BC Managed Care – PPO | Admitting: Internal Medicine

## 2012-07-12 VITALS — BP 122/68 | HR 97 | Temp 97.0°F | Resp 20 | Ht 69.0 in | Wt 156.0 lb

## 2012-07-12 DIAGNOSIS — C3490 Malignant neoplasm of unspecified part of unspecified bronchus or lung: Secondary | ICD-10-CM

## 2012-07-12 DIAGNOSIS — C3410 Malignant neoplasm of upper lobe, unspecified bronchus or lung: Secondary | ICD-10-CM

## 2012-07-12 DIAGNOSIS — C3400 Malignant neoplasm of unspecified main bronchus: Secondary | ICD-10-CM

## 2012-07-12 DIAGNOSIS — Z5111 Encounter for antineoplastic chemotherapy: Secondary | ICD-10-CM

## 2012-07-12 LAB — CBC WITH DIFFERENTIAL/PLATELET
BASO%: 0.4 % (ref 0.0–2.0)
Basophils Absolute: 0 10*3/uL (ref 0.0–0.1)
EOS%: 2.2 % (ref 0.0–7.0)
HCT: 33.6 % — ABNORMAL LOW (ref 38.4–49.9)
HGB: 12 g/dL — ABNORMAL LOW (ref 13.0–17.1)
LYMPH%: 25.5 % (ref 14.0–49.0)
MCH: 30.3 pg (ref 27.2–33.4)
MCHC: 35.7 g/dL (ref 32.0–36.0)
NEUT%: 66.3 % (ref 39.0–75.0)
Platelets: 162 10*3/uL (ref 140–400)

## 2012-07-12 LAB — COMPREHENSIVE METABOLIC PANEL (CC13)
AST: 25 U/L (ref 5–34)
Albumin: 3.7 g/dL (ref 3.5–5.0)
Alkaline Phosphatase: 136 U/L (ref 40–150)
BUN: 28 mg/dL — ABNORMAL HIGH (ref 7.0–26.0)
Calcium: 9.7 mg/dL (ref 8.4–10.4)
Chloride: 105 mEq/L (ref 98–107)
Creatinine: 1.1 mg/dL (ref 0.7–1.3)
Glucose: 90 mg/dl (ref 70–99)

## 2012-07-12 MED ORDER — HEPARIN SOD (PORK) LOCK FLUSH 100 UNIT/ML IV SOLN
500.0000 [IU] | Freq: Once | INTRAVENOUS | Status: AC | PRN
  Administered 2012-07-12: 500 [IU]
  Filled 2012-07-12: qty 5

## 2012-07-12 MED ORDER — PROCHLORPERAZINE MALEATE 10 MG PO TABS: 10.0000 mg | ORAL_TABLET | Freq: Four times a day (QID) | ORAL | Status: DC | PRN

## 2012-07-12 MED ORDER — SODIUM CHLORIDE 0.9 % IV SOLN
Freq: Once | INTRAVENOUS | Status: AC
  Administered 2012-07-12: 12:00:00 via INTRAVENOUS

## 2012-07-12 MED ORDER — PACLITAXEL PROTEIN-BOUND CHEMO INJECTION 100 MG
100.0000 mg/m2 | Freq: Once | INTRAVENOUS | Status: AC
  Administered 2012-07-12: 200 mg via INTRAVENOUS
  Filled 2012-07-12: qty 40

## 2012-07-12 MED ORDER — DEXAMETHASONE SODIUM PHOSPHATE 10 MG/ML IJ SOLN
10.0000 mg | Freq: Once | INTRAMUSCULAR | Status: AC
  Administered 2012-07-12: 10 mg via INTRAVENOUS

## 2012-07-12 MED ORDER — SODIUM CHLORIDE 0.9 % IJ SOLN
10.0000 mL | INTRAMUSCULAR | Status: DC | PRN
  Administered 2012-07-12: 10 mL
  Filled 2012-07-12: qty 10

## 2012-07-12 MED ORDER — ONDANSETRON 8 MG/50ML IVPB (CHCC)
8.0000 mg | Freq: Once | INTRAVENOUS | Status: AC
  Administered 2012-07-12: 8 mg via INTRAVENOUS

## 2012-07-12 NOTE — Telephone Encounter (Signed)
appts made and will have pt p/u a new sch on 9/11

## 2012-07-12 NOTE — Telephone Encounter (Signed)
Per staff message and POF I have scheduled appts.  JMW  

## 2012-07-12 NOTE — Patient Instructions (Addendum)
[facility] Discharge Instructions for Patients Receiving Chemotherapy  Today you received the following chemotherapy agents abraxane  To help prevent nausea and vomiting after your treatment, we encourage you to take your nausea medication if needed Begin taking it at 6pm and take it as often as prescribed if needed.   If you develop nausea and vomiting that is not controlled by your nausea medication, call the clinic. If it is after clinic hours your family physician or the after hours number for the clinic or go to the Emergency Department.   BELOW ARE SYMPTOMS THAT SHOULD BE REPORTED IMMEDIATELY:  *FEVER GREATER THAN 100.5 F  *CHILLS WITH OR WITHOUT FEVER  NAUSEA AND VOMITING THAT IS NOT CONTROLLED WITH YOUR NAUSEA MEDICATION  *UNUSUAL SHORTNESS OF BREATH  *UNUSUAL BRUISING OR BLEEDING  TENDERNESS IN MOUTH AND THROAT WITH OR WITHOUT PRESENCE OF ULCERS  *URINARY PROBLEMS  *BOWEL PROBLEMS  UNUSUAL RASH Items with * indicate a potential emergency and should be followed up as soon as possible.  One of the nurses will contact you 24 hours after your treatment. Please let the nurse know about any problems that you may have experienced. Feel free to call the clinic you have any questions or concerns. The clinic phone number is [phone].   I have been informed and understand all the instructions given to me. I know to contact the clinic, my physician, or go to the Emergency Department if any problems should occur. I do not have any questions at this time, but understand that I may call the clinic during office hours   should I have any questions or need assistance in obtaining follow up care.    __________________________________________  _____________  __________ Signature of Patient or Authorized Representative            Date                   Time    __________________________________________ Nurse's Signature

## 2012-07-12 NOTE — Progress Notes (Signed)
[facility] [phone]   [phone]  OFFICE PROGRESS NOTE  [name], PA [facility] [address] [place] [address]  DIAGNOSIS: Metastatic non-small cell lung cancer, squamous cell carcinoma diagnosed in August of 2013.  PRIOR THERAPY: None  CURRENT THERAPY:  1) Systemic chemotherapy with carboplatin for AUC of 5 on day 1 and Abraxane 100 mg/M2 on days 1, 8 and 15 every 3 weeks. The patient is status post 1 dose 2) palliative radiotherapy to the left hilar mass under the care of Dr. Mitzi Hansen.  INTERVAL HISTORY: Derrick Crosby 61 y.o. male returns to the clinic today for followup visit accompanied his wife and daughter. The patient related the first week of his chemotherapy fairly well except for mild nausea. He took Compazine with improvement in his nausea. He denied having any significant weight loss or night sweats. He denied having any significant chest pain, shortness of breath except with exertion, no cough or hemoptysis. The patient denied having any significant peripheral neuropathy, fever or chills.  MEDICAL HISTORY: Past Medical History  Diagnosis Date  . Hypertension   . Hyperglyceridemia, pure   . COPD (chronic obstructive pulmonary disease)   . Cancer     Skin  Cancer - Left ear.-  . Hemoptysis   . Skin cancer     left ear  . Anxiety   . Depression   . Lung cancer 06/13/12    lung mass /right hilar suspicious for primary bronchogenic ca  . Hemoptysis   . Seasonal allergies     ALLERGIES:   has no known allergies.  MEDICATIONS:  Current Outpatient Prescriptions  Medication Sig Dispense Refill  . acetaminophen (TYLENOL) 325 MG tablet Take 325-650 mg by mouth every 6 (six) hours as needed. For general aches      . albuterol (PROVENTIL HFA;VENTOLIN HFA) 108 (90 BASE) MCG/ACT inhaler Inhale 2 puffs into the lungs every 6 (six) hours as needed for wheezing.  1 Inhaler  6  . citalopram (CELEXA) 20 MG tablet  Take 20 mg by mouth every evening.       . clonazePAM (KLONOPIN) 0.5 MG tablet Take 0.5 mg by mouth 2 (two) times daily as needed. For anxiety      . fish oil-omega-3 fatty acids 1000 MG capsule Take 1 g by mouth daily.       Marland Kitchen lidocaine-prilocaine (EMLA) cream Apply topically as needed. Apply to [facility] A cath site 30-60 min before chemotherapy  30 g  0  . loratadine (CLARITIN) 10 MG tablet Take 10 mg by mouth daily.      . prochlorperazine (COMPAZINE) 10 MG tablet Take 1 tablet (10 mg total) by mouth every 6 (six) hours as needed.  60 tablet  0  . triamcinolone cream (KENALOG) 0.1 % Apply topically 2 (two) times daily.      Marland Kitchen aspirin 81 MG chewable tablet Chew 81 mg by mouth daily.      . Multiple Vitamins-Minerals (MULTIVITAMIN WITH MINERALS) tablet Take 1 tablet by mouth daily.      Marland Kitchen DISCONTD: prochlorperazine (COMPAZINE) 10 MG tablet Take 1 tablet (10 mg total) by mouth every 6 (six) hours as needed.  60 tablet  0    SURGICAL HISTORY:  Past Surgical History  Procedure Date  . No past surgeries   . Tympanoplasty   . Video bronchoscopy 06/13/12    with endobronchial u/s/ with biospy left hilar mass/Dr. Lonia Farber  . Tonsillectomy  child    REVIEW OF SYSTEMS:  A comprehensive review of systems was negative except for: Constitutional: positive for fatigue Respiratory: positive for dyspnea on exertion Gastrointestinal: positive for nausea   PHYSICAL EXAMINATION: General appearance: alert, cooperative and no distress Head: Normocephalic, without obvious abnormality, atraumatic Neck: no adenopathy Lymph nodes: Cervical, supraclavicular, and axillary nodes normal. Resp: clear to auscultation bilaterally Cardio: regular rate and rhythm, S1, S2 normal, no murmur, click, rub or gallop GI: soft, non-tender; bowel sounds normal; no masses,  no organomegaly Extremities: extremities normal, atraumatic, no cyanosis or edema Neurologic: Alert and oriented X 3, normal strength and  tone. Normal symmetric reflexes. Normal coordination and gait  ECOG PERFORMANCE STATUS: 1 - Symptomatic but completely ambulatory  Blood pressure 122/68, pulse 97, temperature 97 F (36.1 C), temperature source Oral, resp. rate 20, height 5\' 9"  (1.753 m), weight 156 lb (70.761 kg).  LABORATORY DATA: Lab Results  Component Value Date   WBC 5.5 07/12/2012   HGB 12.0* 07/12/2012   HCT 33.6* 07/12/2012   MCV 84.8 07/12/2012   PLT 162 07/12/2012      Chemistry      Component Value Date/Time   NA 137 07/05/2012 0803   NA 141 06/27/2012 0904   K 4.4 07/05/2012 0803   K 4.4 06/27/2012 0904   CL 103 07/05/2012 0803   CL 102 06/27/2012 0904   CO2 24 07/05/2012 0803   CO2 28 06/27/2012 0904   BUN 19.0 07/05/2012 0803   BUN 17 06/27/2012 0904   CREATININE 1.1 07/05/2012 0803   CREATININE 1.06 06/27/2012 0904      Component Value Date/Time   CALCIUM 9.8 07/05/2012 0803   CALCIUM 9.5 06/27/2012 0904   ALKPHOS 166* 07/05/2012 0803   ALKPHOS 130* 06/27/2012 0904   AST 26 07/05/2012 0803   AST 28 06/27/2012 0904   ALT 30 07/05/2012 0803   ALT 29 06/27/2012 0904   BILITOT 0.80 07/05/2012 0803   BILITOT 0.5 06/27/2012 4098       RADIOGRAPHIC STUDIES: Mr Laqueta Jean JX Contrast  June 26, 2012  *RADIOLOGY REPORT*  Clinical Data: 61 year old male with new diagnosis of lung cancer. Staging.  Uncontrollable cough.  MRI HEAD WITHOUT AND WITH CONTRAST  Technique:  Multiplanar, multiecho pulse sequences of the brain and surrounding structures were obtained according to standard protocol without and with intravenous contrast  Contrast: 15mL MULTIHANCE GADOBENATE DIMEGLUMINE 529 MG/ML IV SOLN  Comparison: None.  Findings: Motion artifact on postcontrast images. No abnormal enhancement identified.  No restricted diffusion to suggest acute infarction.  No midline shift, mass effect, evidence of mass lesion, ventriculomegaly, extra-axial collection or acute intracranial hemorrhage.  Cervicomedullary junction and pituitary are within  normal limits.  Major intracranial vascular flow voids are preserved. Wallace Cullens and white matter signal is within normal limits throughout the brain.  Grossly negative visualized cervical spine.  Visualized bone marrow signal is within normal limits.  Visualized orbit soft tissues are within normal limits.  Visualized paranasal sinuses and mastoids are clear.  Negative scalp soft tissues.  IMPRESSION: Postcontrast images degraded by motion despite repeated imaging attempts. No acute or metastatic intracranial abnormality identified.  Original Report Authenticated By: Harley Hallmark, M.D.   Nm Pet Image Initial (pi) Skull Base To Thigh  06/16/2012  *RADIOLOGY REPORT*  Clinical Data: Initial treatment strategy for lung cancer.  NUCLEAR MEDICINE PET SKULL BASE TO THIGH  Fasting Blood Glucose:  92  Technique:  19.4 mCi F-18 FDG was injected intravenously. CT data was obtained  and used for attenuation correction and anatomic localization only.  (This was not acquired as a diagnostic CT examination.) Additional exam technical data entered on technologist worksheet.  Comparison:  08/07/2012  Findings:  Neck: No hypermetabolic lymph nodes in the neck.  Chest:  Large central left lung mass along the minor fissure involves the upper lobe, lower lobe, and middle mediastinum, with maximum standard uptake value of 17.6.  Small prevascular node measuring 9 mm in short axis diameter has a maximum standard uptake value of 6.1.  Emphysema is present.  Postobstructive pneumonitis noted in the left lower lobe, with complete collapse of the left lower lobe bronchus due to the dominant mass.  Abdomen/Pelvis:  1.3 cm right adrenal mass has a maximum standard uptake value of 11.8, compatible with metastatic disease.  A 1.4 cm left adrenal mass has a maximum standard uptake value of 11.7 cm, compatible with malignancy.  Small gastric diverticulum noted posteriorly.  Aortoiliac atherosclerotic calcification noted.  A small umbilical hernia  contains adipose tissue.  High activity along the descending colon is most likely physiologic.  Bilateral nonobstructive nephrolithiasis noted.  Several hypodense liver lesions are probably cysts although are technically nonspecific.  No definite hypermetabolic activity in the liver is observed.  [place]:  No focal hypermetabolic activity to suggest skeletal metastasis.  IMPRESSION:  1.  Large hypermetabolic central left hilar mass with medial mediastinal invasion and a pathologic hypermetabolic prevascular lymph node. 2.  Bilateral adrenal metastatic lesions. 3.  Bilateral nonobstructive nephrolithiasis.  Original Report Authenticated By: [name], M.D.   Ir Fluoro Guide Cv Line Right  06/30/2012  *RADIOLOGY REPORT*  Clinical Data/Indication: Lung cancer  TUNNEL POWER PORT PLACEMENT WITH SUBCUTANEOUS POCKET UTILIZING ULTRASOUND & FLOUROSCOPY  Sedation: Versed 2.0 mg, Fentanyl 200 mcg.  Total Moderate Sedation Time: 35 minutes.  As antibiotic prophylaxis, Ancef  was ordered pre-procedure and administered intravenously within one hour of incision.  Fluoroscopy Time: 0.4 minutes.  Procedure:  After written informed consent was obtained, patient was placed in the supine position on angiographic table. The right neck and chest was prepped and draped in a sterile fashion. Lidocaine was utilized for local anesthesia.  The right internal jugular vein was noted to be patent initially with ultrasound. Under sonographic guidance, a micropuncture needle was inserted into the right IJ vein (Ultrasound and fluoroscopic image documentation was performed). The needle was removed over an 018 wire which was exchanged for a Amplatz.  This was advanced into the IVC.  An 8-French dilator was advanced over the Amplatz.  A small incision was made in the right upper chest over the anterior right second rib.  Utilizing blunt dissection, a subcutaneous pocket was created in the caudal direction. The pocket was irrigated with  a copious amount of sterile normal saline.  The port catheter was tunneled from the chest incision, and out the neck incision.  The reservoir was inserted into the subcutaneous pocket and secured with two 3-0 Ethilon stitches.  A peel-away sheath was advanced over the Amplatz wire.  The port catheter was cut to measure length and inserted through the peel-away sheath. The peel-away sheath was removed.  The chest incision was closed with 3-0 Vicryl interrupted stitches for the subcutaneous tissue and a running of 4-0 Vicryl subcuticular stitch for the skin.  The neck incision was closed with a 4-0 Vicryl subcuticular stitch. Derma-bond was applied to both surgical incisions.  The port reservoir was flushed and instilled with heparinized saline.  No complications.  Findings:  A right IJ vein Port-A-Cath is in place with its tip at the cavoatrial junction.  IMPRESSION: Successful 8 French right internal jugular vein power port placement with its tip at the SVC/RA junction.   Original Report Authenticated By: Donavan Burnet, M.D.    Ir US Guide Vasc Access Right  06/30/2012  *RADIOLOGY REPORT*  Clinical Data/Indication: Lung cancer  TUNNEL POWER PORT PLACEMENT WITH SUBCUTANEOUS POCKET UTILIZING ULTRASOUND & FLOUROSCOPY  Sedation: Versed 2.0 mg, Fentanyl 200 mcg.  Total Moderate Sedation Time: 35 minutes.  As antibiotic prophylaxis, Ancef  was ordered pre-procedure and administered intravenously within one hour of incision.  Fluoroscopy Time: 0.4 minutes.  Procedure:  After written informed consent was obtained, patient was placed in the supine position on angiographic table. The right neck and chest was prepped and draped in a sterile fashion. Lidocaine was utilized for local anesthesia.  The right internal jugular vein was noted to be patent initially with ultrasound. Under sonographic guidance, a micropuncture needle was inserted into the right IJ vein (Ultrasound and fluoroscopic image documentation was  performed). The needle was removed over an 018 wire which was exchanged for a Amplatz.  This was advanced into the IVC.  An 8-French dilator was advanced over the Amplatz.  A small incision was made in the right upper chest over the anterior right second rib.  Utilizing blunt dissection, a subcutaneous pocket was created in the caudal direction. The pocket was irrigated with a copious amount of sterile normal saline.  The port catheter was tunneled from the chest incision, and out the neck incision.  The reservoir was inserted into the subcutaneous pocket and secured with two 3-0 Ethilon stitches.  A peel-away sheath was advanced over the Amplatz wire.  The port catheter was cut to measure length and inserted through the peel-away sheath. The peel-away sheath was removed.  The chest incision was closed with 3-0 Vicryl interrupted stitches for the subcutaneous tissue and a running of 4-0 Vicryl subcuticular stitch for the skin.  The neck incision was closed with a 4-0 Vicryl subcuticular stitch. Derma-bond was applied to both surgical incisions.  The port reservoir was flushed and instilled with heparinized saline.  No complications.  Findings:  A right IJ vein Port-A-Cath is in place with its tip at the cavoatrial junction.  IMPRESSION: Successful 8 French right internal jugular vein power port placement with its tip at the SVC/RA junction.   Original Report Authenticated By: Donavan Burnet, M.D.     ASSESSMENT: This is a very pleasant 61 years old white male with metastatic non-small cell lung cancer, squamous cell carcinoma currently undergoing palliative radiotherapy to the left hilar mass as well as systemic chemotherapy with carboplatin and Abraxane status post 1 dose. The patient is tolerating his treatment fairly well except for mild nausea.  PLAN: We'll proceed with the second dose of the first cycle today as scheduled. The patient was advised to take Compazine on as needed for nausea and if no  improvement to call my office for consideration of treatment with Zofran. He would come back for followup visit in 2 weeks for evaluation before starting the first dose of the second cycle. The patient was advised to call me immediately if he has any concerning symptoms in the interval.  All questions were answered. The patient knows to call the clinic with any problems, questions or concerns. We can certainly see the patient much sooner if necessary.  I spent 15 minutes counseling the patient face to  face. The total time spent in the appointment was 25 minutes.

## 2012-07-12 NOTE — Patient Instructions (Signed)
You are doing fine with the chemotherapy. Call if you have persistent nausea after the second dose of the chemotherapy. Followup in 2 weeks.

## 2012-07-13 ENCOUNTER — Telehealth: Payer: Self-pay

## 2012-07-13 ENCOUNTER — Ambulatory Visit
Admission: RE | Admit: 2012-07-13 | Discharge: 2012-07-13 | Disposition: A | Discharge status: Home / Self Care | Payer: BC Managed Care – PPO | Source: Ambulatory Visit | Attending: Radiation Oncology | Admitting: Radiation Oncology

## 2012-07-13 VITALS — BP 139/76 | HR 79 | Temp 98.3°F | Wt 158.2 lb

## 2012-07-13 DIAGNOSIS — C3400 Malignant neoplasm of unspecified main bronchus: Secondary | ICD-10-CM

## 2012-07-13 MED ORDER — SUCRALFATE 1 G PO TABS: 1.0000 g | ORAL_TABLET | Freq: Four times a day (QID) | ORAL | Status: DC

## 2012-07-13 NOTE — Progress Notes (Signed)
Patient here for routine weekly doctor assessment .Continued non-productive cough relieved with otc cough medication.Patient has irritation of esophagus and started burping since last chemotherapy.

## 2012-07-13 NOTE — Telephone Encounter (Signed)
Yesterday pt asked about the soap sample he received in chemo class. Aleta said it was [employer]. LVM for pt today.

## 2012-07-13 NOTE — Progress Notes (Signed)
Department of Radiation Oncology  Phone:  [phone] Fax:        [phone]  Weekly Treatment Note    Name: Derrick Crosby Date: 07/13/2012 MRN: [id number] DOB: 10/19/1951   Current dose:  15 Gy  Current fraction: 6   MEDICATIONS: Current Outpatient Prescriptions  Medication Sig Dispense Refill  . acetaminophen (TYLENOL) 325 MG tablet Take 325-650 mg by mouth every 6 (six) hours as needed. For general aches      . albuterol (PROVENTIL HFA;VENTOLIN HFA) 108 (90 BASE) MCG/ACT inhaler Inhale 2 puffs into the lungs every 6 (six) hours as needed for wheezing.  1 Inhaler  6  . aspirin 81 MG chewable tablet Chew 81 mg by mouth daily.      . citalopram (CELEXA) 20 MG tablet Take 20 mg by mouth every evening.       . clonazePAM (KLONOPIN) 0.5 MG tablet Take 0.5 mg by mouth 2 (two) times daily as needed. For anxiety      . fish oil-omega-3 fatty acids 1000 MG capsule Take 1 g by mouth daily.       Marland Kitchen lidocaine-prilocaine (EMLA) cream Apply topically as needed. Apply to [facility] A cath site 30-60 min before chemotherapy  30 g  0  . loratadine (CLARITIN) 10 MG tablet Take 10 mg by mouth daily.      . Multiple Vitamins-Minerals (MULTIVITAMIN WITH MINERALS) tablet Take 1 tablet by mouth daily.      . prochlorperazine (COMPAZINE) 10 MG tablet Take 1 tablet (10 mg total) by mouth every 6 (six) hours as needed.  60 tablet  0  . triamcinolone cream (KENALOG) 0.1 % Apply topically 2 (two) times daily.       No current facility-administered medications for this encounter.   Facility-Administered Medications Ordered in Other Encounters  Medication Dose Route Frequency Provider Last Rate Last Dose  . 0.9 %  sodium chloride infusion   Intravenous Once [name], MD      . dexamethasone (DECADRON) injection 10 mg  10 mg Intravenous Once [name], MD   10 mg at 07/12/12 1143  . heparin lock flush 100 unit/mL  500 Units Intracatheter Once PRN [name], MD   500 Units at 07/12/12 1311   . ondansetron (ZOFRAN) IVPB 8 mg  8 mg Intravenous Once [name], MD   8 mg at 07/12/12 1143  . PACLitaxel-protein bound (ABRAXANE) chemo infusion 200 mg  100 mg/m2 (Treatment Plan Actual) Intravenous Once [name], MD   200 mg at 07/12/12 1223  . DISCONTD: sodium chloride 0.9 % injection 10 mL  10 mL Intracatheter PRN [name], MD   10 mL at 07/12/12 1311     ALLERGIES: Review of patient's allergies indicates no known allergies.   LABORATORY DATA:  Lab Results  Component Value Date   WBC 5.5 07/12/2012   HGB 12.0* 07/12/2012   HCT 33.6* 07/12/2012   MCV 84.8 07/12/2012   PLT 162 07/12/2012   Lab Results  Component Value Date   NA 138 07/12/2012   K 4.0 07/12/2012   CL 105 07/12/2012   CO2 23 07/12/2012   Lab Results  Component Value Date   ALT 34 07/12/2012   AST 25 07/12/2012   ALKPHOS 136 07/12/2012   BILITOT 0.90 07/12/2012     NARRATIVE: Derrick Crosby was seen today for weekly treatment management. The chart was checked and the patient's films were reviewed. The patient is doing well. No further hemoptysis. He is complaining of  esophagitis which has worsened. Is not taking any strong reflux medication.  PHYSICAL EXAMINATION: weight is 158 lb 3.2 oz (71.759 kg). His temperature is 98.3 F (36.8 C). His blood pressure is 139/76 and his pulse is 79. His oxygen saturation is 97%.        ASSESSMENT: The patient is doing satisfactorily with treatment.  PLAN: We will continue with the patient's radiation treatment as planned. The patient will begin taking Prilosec or its equivalent which they will pick up over-the-counter. I have also called in a prescription for Carafate and I discussed this with them.

## 2012-07-14 ENCOUNTER — Ambulatory Visit
Admission: RE | Admit: 2012-07-14 | Discharge: 2012-07-14 | Disposition: A | Discharge status: Home / Self Care | Payer: BC Managed Care – PPO | Source: Ambulatory Visit | Attending: Radiation Oncology | Admitting: Radiation Oncology

## 2012-07-14 ENCOUNTER — Encounter: Payer: Self-pay | Admitting: Pulmonary Disease

## 2012-07-14 ENCOUNTER — Ambulatory Visit (INDEPENDENT_AMBULATORY_CARE_PROVIDER_SITE_OTHER): Payer: BC Managed Care – PPO | Admitting: Pulmonary Disease

## 2012-07-14 VITALS — BP 108/62 | HR 60 | Temp 98.3°F | Ht 70.0 in | Wt 157.8 lb

## 2012-07-14 DIAGNOSIS — J449 Chronic obstructive pulmonary disease, unspecified: Secondary | ICD-10-CM

## 2012-07-14 DIAGNOSIS — R05 Cough: Secondary | ICD-10-CM

## 2012-07-14 DIAGNOSIS — J988 Other specified respiratory disorders: Secondary | ICD-10-CM

## 2012-07-14 DIAGNOSIS — C3490 Malignant neoplasm of unspecified part of unspecified bronchus or lung: Secondary | ICD-10-CM

## 2012-07-14 DIAGNOSIS — R042 Hemoptysis: Secondary | ICD-10-CM

## 2012-07-14 DIAGNOSIS — J4489 Other specified chronic obstructive pulmonary disease: Secondary | ICD-10-CM

## 2012-07-14 DIAGNOSIS — R059 Cough, unspecified: Secondary | ICD-10-CM

## 2012-07-14 DIAGNOSIS — C3400 Malignant neoplasm of unspecified main bronchus: Secondary | ICD-10-CM

## 2012-07-14 NOTE — Patient Instructions (Addendum)
Call office as needed and when CT is done

## 2012-07-14 NOTE — Progress Notes (Signed)
Name: Derrick Crosby MRN: [id number] DOB: 1951/04/11  Referring Physician:  [name] Reason for Consultation:  Lung mass   INTERVENTIONAL PULMONOLOGY FOLLOW UP NOTE   History of Present Illness:  61 year old active smoker with chronic obstructive pulmonary disease and baseline dyspnea at rest and on exertion as well as chronic cough with sputum production referred here for further evaluation of left hilar mass. Apparently the patient has been coughing up blood for quite some time.  He is not sure if he lost any weight.  He denies fevers, chills or purulent sputum.  There are no voice changes.  There are no headaches, weakness, numbness or double vision.  Interval history:   Continues to abstain from smoking.  Tolerating chemo and radiation therapy well.  No significant dyspnea.  One episode of blood-tinged sputum.    Vital Signs: Filed Vitals:   07/14/12 1428  BP: 108/62  Pulse: 60  Temp: 98.3 F (36.8 C)  TempSrc: Oral  Height: 5\' 10"  (1.778 m)  Weight: 157 lb 12.8 oz (71.578 kg)  SpO2: 98%   Physical Examination: General:  Comfortable, no distress Neuro:  Alert, oriented, cooperative HEENT:  Pink conjunctivae, moist mucus membranes Neck:  No lymphadenopathy Cardiovascular:  RRR, no murmurs Lungs:  Bilateral diminished breath sounds, no added breath sounds Abdomen:  Soft, nontender, nondistended Musculoskeletal:  No cyanosis or edema,  Skin: No rash   Labs:  Lab 07/12/12 0915  HGB 12.0*  HCT 33.6*  WBC 5.5  PLT 162  NA 138  K 4.0  CL 105  CO2 23  GLUCOSE 90  BUN 28.0*  CREATININE 1.1  CALCIUM 9.7  MG --  PHOS --  INR --  APTT --   Chest CT(images reviewed):    06/06/2012 Large, central left hilar mass with direct extension into the posterior mediastinum. Enlarged AP window and subcarinal lymph nodes. Worrisome for metastatic adenopathy.  PET CT(images reviewed):    06/16/2012  Large hypermetabolic central left hilar mass with medial mediastinal  invasion and a pathologic hypermetabolic prevascular lymph node. Bilateral adrenal metastatic lesions.   Bx: 06/13/12  Mass via EBUS-TBNA - Squamous cell carinoma  PFT:    06/16/2012  FEV1/FVC 66  FEV1 2.56(76)  TLC 6.8(120)  DLCO  30.99(50)  DL/VA  7.82(95)  TTE:  NA  ASSESSMENT AND PLAN  Squamous cell carcinoma (lung) by EBUS-TBNA - undergoing chemo / radiation therapy Metastatic disease in prevascular LN and bilateral adrenals by PET Near-total obstruction of the left lower lobe bronchus by endobronchial lesion Hemoptysis, improved Chronic cough, secondary to COPD / endobronchial lesion Tobacco abuse - no abstinent COPD, likely emphysema type with mild airflow limitation, hyperinflation and decreased diffusion capacity  -->  Continue chemo / radiation therapy -->  Call office PRN and when follow up CT scan is performed.  May need to repeat flexible bronchoscopy in the future for bronchoscopic tumor debulking and balloon dilation -->  Continue smoking abstinence -->  Albuterol PRN  Orlean Bradford, M.D., F.C.C.P. Interventional Pulmonology [facility] Cell: [phone] Pager: [phone]  07/14/2012, 3:33 PM

## 2012-07-17 ENCOUNTER — Ambulatory Visit
Admission: RE | Admit: 2012-07-17 | Discharge: 2012-07-17 | Disposition: A | Discharge status: Home / Self Care | Payer: BC Managed Care – PPO | Source: Ambulatory Visit | Attending: Radiation Oncology | Admitting: Radiation Oncology

## 2012-07-18 ENCOUNTER — Ambulatory Visit
Admission: RE | Admit: 2012-07-18 | Discharge: 2012-07-18 | Disposition: A | Discharge status: Home / Self Care | Payer: BC Managed Care – PPO | Source: Ambulatory Visit | Attending: Radiation Oncology | Admitting: Radiation Oncology

## 2012-07-19 ENCOUNTER — Ambulatory Visit
Admission: RE | Admit: 2012-07-19 | Discharge: 2012-07-19 | Disposition: A | Discharge status: Home / Self Care | Payer: BC Managed Care – PPO | Source: Ambulatory Visit | Attending: Radiation Oncology | Admitting: Radiation Oncology

## 2012-07-19 ENCOUNTER — Other Ambulatory Visit (HOSPITAL_BASED_OUTPATIENT_CLINIC_OR_DEPARTMENT_OTHER): Payer: BC Managed Care – PPO | Admitting: Lab

## 2012-07-19 ENCOUNTER — Ambulatory Visit (HOSPITAL_BASED_OUTPATIENT_CLINIC_OR_DEPARTMENT_OTHER): Payer: BC Managed Care – PPO

## 2012-07-19 ENCOUNTER — Encounter: Payer: Self-pay | Admitting: Medical Oncology

## 2012-07-19 DIAGNOSIS — C3490 Malignant neoplasm of unspecified part of unspecified bronchus or lung: Secondary | ICD-10-CM

## 2012-07-19 DIAGNOSIS — C3410 Malignant neoplasm of upper lobe, unspecified bronchus or lung: Secondary | ICD-10-CM

## 2012-07-19 DIAGNOSIS — C3400 Malignant neoplasm of unspecified main bronchus: Secondary | ICD-10-CM

## 2012-07-19 LAB — COMPREHENSIVE METABOLIC PANEL (CC13)
ALT: 32 U/L (ref 0–55)
AST: 23 U/L (ref 5–34)
Albumin: 3.4 g/dL — ABNORMAL LOW (ref 3.5–5.0)
Alkaline Phosphatase: 124 U/L (ref 40–150)
BUN: 19 mg/dL (ref 7.0–26.0)
Chloride: 107 mEq/L (ref 98–107)
Potassium: 3.9 mEq/L (ref 3.5–5.1)

## 2012-07-19 LAB — CBC WITH DIFFERENTIAL/PLATELET
BASO%: 1 % (ref 0.0–2.0)
EOS%: 1.9 % (ref 0.0–7.0)
HCT: 31.3 % — ABNORMAL LOW (ref 38.4–49.9)
MCH: 30.2 pg (ref 27.2–33.4)
MCHC: 35.1 g/dL (ref 32.0–36.0)
MONO#: 0.3 10*3/uL (ref 0.1–0.9)
NEUT%: 47.6 % (ref 39.0–75.0)
RBC: 3.64 10*6/uL — ABNORMAL LOW (ref 4.20–5.82)
RDW: 12.3 % (ref 11.0–14.6)
WBC: 2.1 10*3/uL — ABNORMAL LOW (ref 4.0–10.3)
lymph#: 0.7 10*3/uL — ABNORMAL LOW (ref 0.9–3.3)
nRBC: 0 % (ref 0–0)

## 2012-07-19 MED ORDER — SODIUM CHLORIDE 0.9 % IJ SOLN
10.0000 mL | INTRAMUSCULAR | Status: DC | PRN
  Administered 2012-07-19: 10 mL via INTRAVENOUS
  Filled 2012-07-19: qty 10

## 2012-07-19 MED ORDER — HEPARIN SOD (PORK) LOCK FLUSH 100 UNIT/ML IV SOLN
500.0000 [IU] | Freq: Once | INTRAVENOUS | Status: AC
  Administered 2012-07-19: 500 [IU] via INTRAVENOUS
  Filled 2012-07-19: qty 5

## 2012-07-19 NOTE — Patient Instructions (Addendum)
Neutropenia  Neutropenia is a condition that occurs when the level of a certain type of white blood cell (neutrophil) in your body becomes lower than normal. Neutrophils are made in the bone marrow and fight infections. These cells protect against bacteria and viruses. The fewer neutrophils you have, and the longer your body remains without them, the greater your risk of getting a severe infection becomes.  CAUSES   The cause of neutropenia may be hard to determine. However, it is usually due to 3 main problems:   · Decreased production of neutrophils. This may be due to:  · Certain medicines such as chemotherapy.  · Genetic problems.  · Cancer.  · Radiation treatments.  · Vitamin deficiency.  · Some pesticides.  · Increased destruction of neutrophils. This may be due to:  · Overwhelming infections.  · Hemolytic anemia. This is when the body destroys its own blood cells.  · Chemotherapy.  · Neutrophils moving to areas of the body where they cannot fight infections. This may be due to:  · Dialysis procedures.  · Conditions where the spleen becomes enlarged. Neutrophils are held in the spleen and are not available to the rest of the body.  · Overwhelming infections. The neutrophils are held in the area of the infection and are not available to the rest of the body.  SYMPTOMS   There are no specific symptoms of neutropenia. The lack of neutrophils can result in an infection, and an infection can cause various problems.  DIAGNOSIS   Diagnosis is made by a blood test. A complete blood count is performed. The normal level of neutrophils in human blood differs with age and race. Infants have lower counts than older children and adults. African Americans have lower counts than Caucasians or Asians. The average adult level is 1500 cells/mm3 of blood. Neutrophil counts are interpreted as follows:  · Greater than 1000 cells/mm3 gives normal protection against infection.  · 500 to 1000 cells/mm3 gives an increased risk for  infection.  · 200 to 500 cells/mm3 is a greater risk for severe infection.  · Lower than 200 cells/mm3 is a marked risk of infection. This may require hospitalization and treatment with antibiotic medicines.  TREATMENT   Treatment depends on the underlying cause, severity, and presence of infections or symptoms. It also depends on your health. Your caregiver will discuss the treatment plan with you. Mild cases are often easily treated and have a good outcome. Preventative measures may also be started to limit your risk of infections. Treatment can include:  · Taking antibiotics.  · Stopping medicines that are known to cause neutropenia.  · Correcting nutritional deficiencies by eating green vegetables to supply folic acid and taking vitamin B supplements.  · Stopping exposure to pesticides if your neutropenia is related to pesticide exposure.  · Taking a blood growth factor called sargramostim, pegfilgrastim, or filgrastim if you are undergoing chemotherapy for cancer. This stimulates white blood cell production.  · Removal of the spleen if you have Felty's syndrome and have repeated infections.  HOME CARE INSTRUCTIONS   · Follow your caregiver's instructions about when you need to have blood work done.  · Wash your hands often. Make sure others who come in contact with you also wash their hands.  · Wash raw fruits and vegetables before eating them. They can carry bacteria and fungi.  · Avoid people with colds or spreadable (contagious) diseases (chickenpox, herpes zoster, influenza).  · Avoid large crowds.  · Avoid construction   areas. The dust can release fungus into the air.  · Be cautious around children in daycare or school environments.  · Take care of your respiratory system by coughing and deep breathing.  · Bathe daily.  · Protect your skin from cuts and burns.  · Do not work in the garden or with flowers and plants.  · Care for the mouth before and after meals by brushing with a soft toothbrush. If you have  mucositis, do not use mouthwash. Mouthwash contains alcohol and can dry out the mouth even more.  · Clean the area between the genitals and the anus (perineal area) after urination and bowel movements. Women need to wipe from front to back.  · Use a water soluble lubricant during sexual intercourse and practice good hygiene after. Do not have intercourse if you are severely neutropenic. Check with your caregiver for guidelines.  · Exercise daily as tolerated.  · Avoid people who were vaccinated with a live vaccine in the past 30 days. You should not receive live vaccines (polio, typhoid).  · Do not provide direct care for pets. Avoid animal droppings. Do not clean litter boxes and bird cages.  · Do not share food utensils.  · Do not use tampons, enemas, or rectal suppositories unless directed by your caregiver.  · Use an electric razor to remove hair.  · Wash your hands after handling magazines, letters, and newspapers.  SEEK IMMEDIATE MEDICAL CARE IF:   · You have a fever.  · You have chills or start to shake.  · You feel nauseous or vomit.  · You develop mouth sores.  · You develop aches and pains.  · You have redness and swelling around open wounds.  · Your skin is warm to the touch.  · You have pus coming from your wounds.  · You develop swollen lymph nodes.  · You feel weak or fatigued.  · You develop red streaks on the skin.  MAKE SURE YOU:  · Understand these instructions.  · Will watch your condition.  · Will get help right away if you are not doing well or get worse.  Document Released: 04/16/2002 Document Revised: 10/14/2011 Document Reviewed: 05/14/2011  [employer]® Patient Information ©2012 [employer].

## 2012-07-19 NOTE — Progress Notes (Signed)
Spoke with Dr. Arbutus Ped  About CBC. WBC 2.1 ANC 1.0 and Platelets 76. Hold treatment today. Pt is scheduled for next week lab,MD and chemo. Pt asked for labs to be drawn thru port.

## 2012-07-19 NOTE — Progress Notes (Unsigned)
I discussed neutropenia precautions with pt and wife. I also instructed him to call if fever or chills and if his nose bleeds get worse to call. They both voiced understanding.

## 2012-07-20 ENCOUNTER — Ambulatory Visit
Admission: RE | Admit: 2012-07-20 | Discharge: 2012-07-20 | Disposition: A | Discharge status: Home / Self Care | Payer: BC Managed Care – PPO | Source: Ambulatory Visit | Attending: Radiation Oncology | Admitting: Radiation Oncology

## 2012-07-20 ENCOUNTER — Encounter: Payer: Self-pay | Admitting: Radiation Oncology

## 2012-07-20 VITALS — BP 111/78 | HR 70 | Resp 18 | Wt 191.6 lb

## 2012-07-20 DIAGNOSIS — C3400 Malignant neoplasm of unspecified main bronchus: Secondary | ICD-10-CM

## 2012-07-20 NOTE — Progress Notes (Signed)
Department of Radiation Oncology  Phone:  [phone] Fax:        [phone]  Weekly Treatment Note    Name: Derrick Crosby Date: 07/20/2012 MRN: [id number] DOB: Mar 26, 1951   Current dose: 27.5 Gy  Current fraction: 11   MEDICATIONS: Current Outpatient Prescriptions  Medication Sig Dispense Refill  . acetaminophen (TYLENOL) 325 MG tablet Take 325-650 mg by mouth every 6 (six) hours as needed. For general aches      . albuterol (PROVENTIL HFA;VENTOLIN HFA) 108 (90 BASE) MCG/ACT inhaler Inhale 2 puffs into the lungs every 6 (six) hours as needed for wheezing.  1 Inhaler  6  . aspirin 81 MG chewable tablet Chew 81 mg by mouth daily.      . citalopram (CELEXA) 20 MG tablet Take 20 mg by mouth every evening.       . clonazePAM (KLONOPIN) 0.5 MG tablet Take 0.5 mg by mouth 2 (two) times daily as needed. For anxiety      . fish oil-omega-3 fatty acids 1000 MG capsule Take 1 g by mouth daily.       Marland Kitchen lidocaine-prilocaine (EMLA) cream Apply topically as needed. Apply to [facility] A cath site 30-60 min before chemotherapy  30 g  0  . loratadine (CLARITIN) 10 MG tablet Take 10 mg by mouth daily.      . Multiple Vitamins-Minerals (MULTIVITAMIN WITH MINERALS) tablet Take 1 tablet by mouth daily.      . sucralfate (CARAFATE) 1 G tablet Take 1 tablet (1 g total) by mouth 4 (four) times daily. Dissolve in 15 cc water.  120 tablet  2  . triamcinolone cream (KENALOG) 0.1 % Apply topically 2 (two) times daily.      . prochlorperazine (COMPAZINE) 10 MG tablet Take 1 tablet (10 mg total) by mouth every 6 (six) hours as needed.  60 tablet  0   No current facility-administered medications for this encounter.   Facility-Administered Medications Ordered in Other Encounters  Medication Dose Route Frequency Provider Last Rate Last Dose  . heparin lock flush 100 unit/mL  500 Units Intravenous Once [name], MD   500 Units at 07/19/12 1109  . DISCONTD: sodium chloride 0.9 % injection 10 mL  10 mL  Intravenous PRN [name], MD   10 mL at 07/19/12 1109     ALLERGIES: Review of patient's allergies indicates no known allergies.   LABORATORY DATA:  Lab Results  Component Value Date   WBC 2.1* 07/19/2012   HGB 11.0* 07/19/2012   HCT 31.3* 07/19/2012   MCV 86.0 07/19/2012   PLT 76* 07/19/2012   Lab Results  Component Value Date   NA 138 07/19/2012   K 3.9 07/19/2012   CL 107 07/19/2012   CO2 21* 07/19/2012   Lab Results  Component Value Date   ALT 32 07/19/2012   AST 23 07/19/2012   ALKPHOS 124 07/19/2012   BILITOT 0.50 07/19/2012     NARRATIVE: Derrick Crosby was seen today for weekly treatment management. The chart was checked and the patient's films were reviewed. The patient is doing well with his treatment. No complaints. His chemotherapy has been held due to low platelets. He has had some bleeding from the nose over the last several days. This has stopped when he is cleared his nose. We discussed him being very gentle on this area as to not cause further bleeding.  PHYSICAL EXAMINATION: weight is 191 lb 9.6 oz (86.909 kg). His blood pressure is 111/78 and his pulse  is 70. His respiration is 18 and oxygen saturation is 100%.        ASSESSMENT: The patient is doing satisfactorily with treatment.  PLAN: We will continue with the patient's radiation treatment as planned.

## 2012-07-20 NOTE — Progress Notes (Signed)
Patient presents to the clinic today accompanied by his wife and daughter for a PUT with Dr. Mitzi Hansen. Patient is alert and oriented to person, place, and time. No distress noted. Steady gait noted. Pleasant affect noted. Patient denies pain at this time. Patient reports he "is more tired." Patient reports he was unable to receive chemotherapy because his platelets were too low. Patient reports he has had a bloody nose for last 2-3 days. Patient denies shortness of breath. Patient reports only an occasional dry cough. Patient reports he has a good appetite and his wife confirms such. Patient reports sleeping without difficulty. Reported all findings to Dr. Mitzi Hansen.

## 2012-07-21 ENCOUNTER — Telehealth: Payer: Self-pay | Admitting: *Deleted

## 2012-07-21 ENCOUNTER — Ambulatory Visit
Admission: RE | Admit: 2012-07-21 | Discharge: 2012-07-21 | Disposition: A | Discharge status: Home / Self Care | Payer: BC Managed Care – PPO | Source: Ambulatory Visit | Attending: Radiation Oncology | Admitting: Radiation Oncology

## 2012-07-21 NOTE — Telephone Encounter (Signed)
Patient here with wife and son awaiting radiation appointment.  Walk in form received reporting "nose bleeding, not all the time, is this related to low platelets".  Wife [name] reports "his nose was not physically examined by RT" and expressed desire for more information and confirmation.    Explained function of platelets, side effects of chemotherapy and recent drop in pltc.  Explained that if nose is bleeding at won't stop within ten minutes to go to ER.   Nose not bleeding at this time nor was it bleeding when seen by Radiation oncologist earlier this week.  Report "blood just comes out or nose bleeds at least three times a day and it doesn't bleed longer than ten minutes.  Thanked me for explaining.

## 2012-07-24 ENCOUNTER — Ambulatory Visit
Admission: RE | Admit: 2012-07-24 | Discharge: 2012-07-24 | Disposition: A | Discharge status: Home / Self Care | Payer: BC Managed Care – PPO | Source: Ambulatory Visit | Attending: Radiation Oncology | Admitting: Radiation Oncology

## 2012-07-25 ENCOUNTER — Ambulatory Visit
Admission: RE | Admit: 2012-07-25 | Discharge: 2012-07-25 | Disposition: A | Discharge status: Home / Self Care | Payer: BC Managed Care – PPO | Source: Ambulatory Visit | Attending: Radiation Oncology | Admitting: Radiation Oncology

## 2012-07-26 ENCOUNTER — Ambulatory Visit (HOSPITAL_BASED_OUTPATIENT_CLINIC_OR_DEPARTMENT_OTHER): Payer: BC Managed Care – PPO | Admitting: Internal Medicine

## 2012-07-26 ENCOUNTER — Ambulatory Visit
Admission: RE | Admit: 2012-07-26 | Discharge: 2012-07-26 | Disposition: A | Discharge status: Home / Self Care | Payer: BC Managed Care – PPO | Source: Ambulatory Visit | Attending: Radiation Oncology | Admitting: Radiation Oncology

## 2012-07-26 ENCOUNTER — Encounter: Payer: Self-pay | Admitting: Radiation Oncology

## 2012-07-26 ENCOUNTER — Other Ambulatory Visit: Payer: BC Managed Care – PPO | Admitting: Lab

## 2012-07-26 ENCOUNTER — Other Ambulatory Visit (HOSPITAL_BASED_OUTPATIENT_CLINIC_OR_DEPARTMENT_OTHER): Payer: BC Managed Care – PPO | Admitting: Lab

## 2012-07-26 ENCOUNTER — Ambulatory Visit: Payer: BC Managed Care – PPO

## 2012-07-26 ENCOUNTER — Telehealth: Payer: Self-pay | Admitting: Internal Medicine

## 2012-07-26 VITALS — BP 102/63 | HR 77 | Temp 96.9°F | Resp 20 | Ht 70.0 in | Wt 160.2 lb

## 2012-07-26 VITALS — BP 122/74 | HR 66 | Resp 20 | Wt 160.4 lb

## 2012-07-26 DIAGNOSIS — C3400 Malignant neoplasm of unspecified main bronchus: Secondary | ICD-10-CM

## 2012-07-26 DIAGNOSIS — C3490 Malignant neoplasm of unspecified part of unspecified bronchus or lung: Secondary | ICD-10-CM

## 2012-07-26 DIAGNOSIS — C7970 Secondary malignant neoplasm of unspecified adrenal gland: Secondary | ICD-10-CM

## 2012-07-26 LAB — CBC WITH DIFFERENTIAL/PLATELET
Basophils Absolute: 0 10*3/uL (ref 0.0–0.1)
EOS%: 1.3 % (ref 0.0–7.0)
Eosinophils Absolute: 0.1 10*3/uL (ref 0.0–0.5)
LYMPH%: 18.8 % (ref 14.0–49.0)
MCH: 30.6 pg (ref 27.2–33.4)
MCV: 85.7 fL (ref 79.3–98.0)
MONO%: 16.2 % — ABNORMAL HIGH (ref 0.0–14.0)
NEUT#: 2.4 10*3/uL (ref 1.5–6.5)
Platelets: 69 10*3/uL — ABNORMAL LOW (ref 140–400)
RBC: 3.63 10*6/uL — ABNORMAL LOW (ref 4.20–5.82)
RDW: 13.2 % (ref 11.0–14.6)
nRBC: 0 % (ref 0–0)

## 2012-07-26 NOTE — Telephone Encounter (Signed)
PRINTED AND MADE APPT FOR PT.

## 2012-07-26 NOTE — Progress Notes (Signed)
[facility] [phone]   Fax:([phone]  OFFICE PROGRESS NOTE  [name], PA [facility] [address] [place] [address]  DIAGNOSIS: Metastatic non-small cell lung cancer, squamous cell carcinoma diagnosed in August of 2013.   PRIOR THERAPY: None   CURRENT THERAPY:  1) Systemic chemotherapy with carboplatin for AUC of 5 on day 1 and Abraxane 100 mg/M2 on days 1, 8 and 15 every 3 weeks. The patient is status post 1 dose  2) palliative radiotherapy to the left hilar mass under the care of Dr. Mitzi Hansen.   INTERVAL HISTORY: Guled Addair 61 y.o. male returns to the clinic today for followup visit accompanied by his wife and sister-in-law. The patient is feeling fine today with no specific complaints. He tolerated the previous cycle of the chemotherapy fairly well with no significant adverse effect except for thrombocytopenia. He denied having any significant fever or chills, no nausea or vomiting. The patient denied having any significant weight loss or night sweats. He denied having any chest pain, shortness breath, cough or hemoptysis. He was scheduled today to start the second cycle of his systemic chemotherapy.  MEDICAL HISTORY: Past Medical History  Diagnosis Date  . Hypertension   . Hyperglyceridemia, pure   . COPD (chronic obstructive pulmonary disease)   . Cancer     Skin  Cancer - Left ear.-  . Hemoptysis   . Skin cancer     left ear  . Anxiety   . Depression   . Lung cancer 06/13/12    lung mass /right hilar suspicious for primary bronchogenic ca  . Hemoptysis   . Seasonal allergies     ALLERGIES:   has no known allergies.  MEDICATIONS:  Current Outpatient Prescriptions  Medication Sig Dispense Refill  . acetaminophen (TYLENOL) 325 MG tablet Take 325-650 mg by mouth every 6 (six) hours as needed. For general aches      . albuterol (PROVENTIL HFA;VENTOLIN HFA) 108 (90 BASE) MCG/ACT inhaler Inhale 2 puffs into the  lungs every 6 (six) hours as needed for wheezing.  1 Inhaler  6  . aspirin 81 MG chewable tablet Chew 81 mg by mouth daily.      . citalopram (CELEXA) 20 MG tablet Take 20 mg by mouth every evening.       . clonazePAM (KLONOPIN) 0.5 MG tablet Take 0.5 mg by mouth 2 (two) times daily as needed. For anxiety      . fish oil-omega-3 fatty acids 1000 MG capsule Take 1 g by mouth daily.       Marland Kitchen lidocaine-prilocaine (EMLA) cream Apply topically as needed. Apply to [facility] A cath site 30-60 min before chemotherapy  30 g  0  . loratadine (CLARITIN) 10 MG tablet Take 10 mg by mouth daily.      . Multiple Vitamins-Minerals (MULTIVITAMIN WITH MINERALS) tablet Take 1 tablet by mouth daily.      . sucralfate (CARAFATE) 1 G tablet Take 1 tablet (1 g total) by mouth 4 (four) times daily. Dissolve in 15 cc water.  120 tablet  2  . triamcinolone cream (KENALOG) 0.1 % Apply topically 2 (two) times daily.      . prochlorperazine (COMPAZINE) 10 MG tablet Take 1 tablet (10 mg total) by mouth every 6 (six) hours as needed.  60 tablet  0    SURGICAL HISTORY:  Past Surgical History  Procedure Date  . No past surgeries   . Tympanoplasty   . Video  bronchoscopy 06/13/12    with endobronchial u/s/ with biospy left hilar mass/Dr. Lonia Farber  . Tonsillectomy     child    REVIEW OF SYSTEMS:  A comprehensive review of systems was negative.   PHYSICAL EXAMINATION: General appearance: alert, cooperative and no distress Head: Normocephalic, without obvious abnormality, atraumatic Neck: no adenopathy Lymph nodes: Cervical, supraclavicular, and axillary nodes normal. Resp: clear to auscultation bilaterally Cardio: regular rate and rhythm, S1, S2 normal, no murmur, click, rub or gallop GI: soft, non-tender; bowel sounds normal; no masses,  no organomegaly Extremities: extremities normal, atraumatic, no cyanosis or edema Neurologic: Alert and oriented X 3, normal strength and tone. Normal symmetric reflexes.  Normal coordination and gait  ECOG PERFORMANCE STATUS: 1 - Symptomatic but completely ambulatory  Blood pressure 102/63, pulse 77, temperature 96.9 F (36.1 C), resp. rate 20, height 5\' 10"  (1.778 m), weight 160 lb 3.2 oz (72.666 kg).  LABORATORY DATA: Lab Results  Component Value Date   WBC 3.8* 07/26/2012   HGB 11.1* 07/26/2012   HCT 31.1* 07/26/2012   MCV 85.7 07/26/2012   PLT 69* 07/26/2012      Chemistry      Component Value Date/Time   NA 138 07/19/2012 1028   NA 141 06/27/2012 0904   K 3.9 07/19/2012 1028   K 4.4 06/27/2012 0904   CL 107 07/19/2012 1028   CL 102 06/27/2012 0904   CO2 21* 07/19/2012 1028   CO2 28 06/27/2012 0904   BUN 19.0 07/19/2012 1028   BUN 17 06/27/2012 0904   CREATININE 1.1 07/19/2012 1028   CREATININE 1.06 06/27/2012 0904      Component Value Date/Time   CALCIUM 8.9 07/19/2012 1028   CALCIUM 9.5 06/27/2012 0904   ALKPHOS 124 07/19/2012 1028   ALKPHOS 130* 06/27/2012 0904   AST 23 07/19/2012 1028   AST 28 06/27/2012 0904   ALT 32 07/19/2012 1028   ALT 29 06/27/2012 0904   BILITOT 0.50 07/19/2012 1028   BILITOT 0.5 06/27/2012 0904       RADIOGRAPHIC STUDIES: Ir Fluoro Guide Cv Line Right  06/30/2012  *RADIOLOGY REPORT*  Clinical Data/Indication: Lung cancer  TUNNEL POWER PORT PLACEMENT WITH SUBCUTANEOUS POCKET UTILIZING ULTRASOUND & FLOUROSCOPY  Sedation: Versed 2.0 mg, Fentanyl 200 mcg.  Total Moderate Sedation Time: 35 minutes.  As antibiotic prophylaxis, Ancef  was ordered pre-procedure and administered intravenously within one hour of incision.  Fluoroscopy Time: 0.4 minutes.  Procedure:  After written informed consent was obtained, patient was placed in the supine position on angiographic table. The right neck and chest was prepped and draped in a sterile fashion. Lidocaine was utilized for local anesthesia.  The right internal jugular vein was noted to be patent initially with ultrasound. Under sonographic guidance, a micropuncture needle was inserted into the  right IJ vein (Ultrasound and fluoroscopic image documentation was performed). The needle was removed over an 018 wire which was exchanged for a Amplatz.  This was advanced into the IVC.  An 8-French dilator was advanced over the Amplatz.  A small incision was made in the right upper chest over the anterior right second rib.  Utilizing blunt dissection, a subcutaneous pocket was created in the caudal direction. The pocket was irrigated with a copious amount of sterile normal saline.  The port catheter was tunneled from the chest incision, and out the neck incision.  The reservoir was inserted into the subcutaneous pocket and secured with two 3-0 Ethilon stitches.  A peel-away sheath was advanced  over the Amplatz wire.  The port catheter was cut to measure length and inserted through the peel-away sheath. The peel-away sheath was removed.  The chest incision was closed with 3-0 Vicryl interrupted stitches for the subcutaneous tissue and a running of 4-0 Vicryl subcuticular stitch for the skin.  The neck incision was closed with a 4-0 Vicryl subcuticular stitch. Derma-bond was applied to both surgical incisions.  The port reservoir was flushed and instilled with heparinized saline.  No complications.  Findings:  A right IJ vein Port-A-Cath is in place with its tip at the cavoatrial junction.  IMPRESSION: Successful 8 French right internal jugular vein power port placement with its tip at the SVC/RA junction.   Original Report Authenticated By: Donavan Burnet, M.D.    Ir US Guide Vasc Access Right  06/30/2012  *RADIOLOGY REPORT*  Clinical Data/Indication: Lung cancer  TUNNEL POWER PORT PLACEMENT WITH SUBCUTANEOUS POCKET UTILIZING ULTRASOUND & FLOUROSCOPY  Sedation: Versed 2.0 mg, Fentanyl 200 mcg.  Total Moderate Sedation Time: 35 minutes.  As antibiotic prophylaxis, Ancef  was ordered pre-procedure and administered intravenously within one hour of incision.  Fluoroscopy Time: 0.4 minutes.  Procedure:  After written  informed consent was obtained, patient was placed in the supine position on angiographic table. The right neck and chest was prepped and draped in a sterile fashion. Lidocaine was utilized for local anesthesia.  The right internal jugular vein was noted to be patent initially with ultrasound. Under sonographic guidance, a micropuncture needle was inserted into the right IJ vein (Ultrasound and fluoroscopic image documentation was performed). The needle was removed over an 018 wire which was exchanged for a Amplatz.  This was advanced into the IVC.  An 8-French dilator was advanced over the Amplatz.  A small incision was made in the right upper chest over the anterior right second rib.  Utilizing blunt dissection, a subcutaneous pocket was created in the caudal direction. The pocket was irrigated with a copious amount of sterile normal saline.  The port catheter was tunneled from the chest incision, and out the neck incision.  The reservoir was inserted into the subcutaneous pocket and secured with two 3-0 Ethilon stitches.  A peel-away sheath was advanced over the Amplatz wire.  The port catheter was cut to measure length and inserted through the peel-away sheath. The peel-away sheath was removed.  The chest incision was closed with 3-0 Vicryl interrupted stitches for the subcutaneous tissue and a running of 4-0 Vicryl subcuticular stitch for the skin.  The neck incision was closed with a 4-0 Vicryl subcuticular stitch. Derma-bond was applied to both surgical incisions.  The port reservoir was flushed and instilled with heparinized saline.  No complications.  Findings:  A right IJ vein Port-A-Cath is in place with its tip at the cavoatrial junction.  IMPRESSION: Successful 8 French right internal jugular vein power port placement with its tip at the SVC/RA junction.   Original Report Authenticated By: Donavan Burnet, M.D.     ASSESSMENT: This is a very pleasant 61 years old white male with metastatic non-small  cell lung cancer, squamous cell carcinoma currently undergoing systemic chemotherapy with carboplatin and Abraxane status post 1 cycle. The patient missed day 15 of the first cycle secondary to thrombocytopenia.   PLAN: He was supposed to start day 1 of the second cycle of chemotherapy today but unfortunately the patient continues to have persistent thrombocytopenia secondary to his previous chemotherapy. I discussed the lab result with the patient and his  family. I recommended for him to delay the start of the second cycle of his chemotherapy to next week. I would also reduce his dose of chemotherapy to carboplatin for AUC of 5 and Abraxane 90 mg/M2. He would come back for followup visit at that time with repeat CBC before resuming her systemic therapy. The patient was advised to call immediately if he has any concerning symptoms in the interval.  All questions were answered. The patient knows to call the clinic with any problems, questions or concerns. We can certainly see the patient much sooner if necessary.  I spent 15 minutes counseling the patient face to face. The total time spent in the appointment was 25 minutes.

## 2012-07-26 NOTE — Progress Notes (Signed)
   Department of Radiation Oncology  Phone:  [phone] Fax:        [phone]  Weekly Treatment Note    Name: Derrick Crosby Date: 07/26/2012 MRN: [id number] DOB: 1950/12/18   Current dose: 37.5 Gy  Current fraction: 15   MEDICATIONS: Current Outpatient Prescriptions  Medication Sig Dispense Refill  . acetaminophen (TYLENOL) 325 MG tablet Take 325-650 mg by mouth every 6 (six) hours as needed. For general aches      . albuterol (PROVENTIL HFA;VENTOLIN HFA) 108 (90 BASE) MCG/ACT inhaler Inhale 2 puffs into the lungs every 6 (six) hours as needed for wheezing.  1 Inhaler  6  . aspirin 81 MG chewable tablet Chew 81 mg by mouth daily.      . citalopram (CELEXA) 20 MG tablet Take 20 mg by mouth every evening.       . clonazePAM (KLONOPIN) 0.5 MG tablet Take 0.5 mg by mouth 2 (two) times daily as needed. For anxiety      . fish oil-omega-3 fatty acids 1000 MG capsule Take 1 g by mouth daily.       Marland Kitchen lidocaine-prilocaine (EMLA) cream Apply topically as needed. Apply to [facility] A cath site 30-60 min before chemotherapy  30 g  0  . loratadine (CLARITIN) 10 MG tablet Take 10 mg by mouth daily.      . Multiple Vitamins-Minerals (MULTIVITAMIN WITH MINERALS) tablet Take 1 tablet by mouth daily.      . prochlorperazine (COMPAZINE) 10 MG tablet Take 1 tablet (10 mg total) by mouth every 6 (six) hours as needed.  60 tablet  0  . sucralfate (CARAFATE) 1 G tablet Take 1 tablet (1 g total) by mouth 4 (four) times daily. Dissolve in 15 cc water.  120 tablet  2  . triamcinolone cream (KENALOG) 0.1 % Apply topically 2 (two) times daily.         ALLERGIES: Review of patient's allergies indicates no known allergies.   LABORATORY DATA:  Lab Results  Component Value Date   WBC 3.8* 07/26/2012   HGB 11.1* 07/26/2012   HCT 31.1* 07/26/2012   MCV 85.7 07/26/2012   PLT 69* 07/26/2012   Lab Results  Component Value Date   NA 138 07/19/2012   K 3.9 07/19/2012   CL 107 07/19/2012   CO2 21* 07/19/2012    Lab Results  Component Value Date   ALT 32 07/19/2012   AST 23 07/19/2012   ALKPHOS 124 07/19/2012   BILITOT 0.50 07/19/2012     NARRATIVE: Derrick Crosby was seen today for weekly treatment management. The chart was checked and the patient's films were reviewed. The patient completed his final fraction today. No further nosebleeds and no hemoptysis. Continued nonproductive cough unchanged. The patient is using Biafine cream on his skin in the chest area.  PHYSICAL EXAMINATION: weight is 160 lb 6.4 oz (72.757 kg). His blood pressure is 122/74 and his pulse is 66. His respiration is 20 and oxygen saturation is 100%.      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 No significant desquamation  ASSESSMENT: The patient did satisfactorily with treatment. The patient's hemoptysis subsided during treatment and his breathing has improved somewhat.  PLAN: Followup in one month.

## 2012-07-26 NOTE — Progress Notes (Signed)
Patient here for eot radiation lung, completed 15/15 txs Patient alert,oriented x3, non-prod coughing, Dry skin, peeling on back, using biafine cream, c/o itching, has labs and possible chemotherapy today if labs okay,  Eating better,  10:18 AM

## 2012-07-26 NOTE — Patient Instructions (Signed)
Your platelets count are still low today. Would delay the start of cycle #2 of chemotherapy to next week. Followup visit next week

## 2012-07-27 ENCOUNTER — Ambulatory Visit: Payer: BC Managed Care – PPO

## 2012-07-28 ENCOUNTER — Ambulatory Visit: Payer: BC Managed Care – PPO

## 2012-07-31 ENCOUNTER — Ambulatory Visit: Payer: BC Managed Care – PPO

## 2012-08-01 ENCOUNTER — Ambulatory Visit: Payer: BC Managed Care – PPO

## 2012-08-01 NOTE — Progress Notes (Signed)
  Radiation Oncology         [phone] ________________________________  Name: Derrick Crosby MRN: [id number]  Date: 07/05/2012  DOB: 1951-06-07  Simulation Verification Note   NARRATIVE: The patient was brought to the treatment unit and placed in the planned treatment position. The clinical setup was verified. Then port films were obtained and uploaded to the radiation oncology medical record software.  The treatment beams were carefully compared against the planned radiation fields. The position, location, and shape of the radiation fields was reviewed. The targeted volume of tissue appears to be appropriately covered by the radiation beams. Based on my personal review, I approved the simulation verification. The patient's treatment will proceed as planned.  ________________________________   [name], MD, PhD

## 2012-08-01 NOTE — Progress Notes (Signed)
  Radiation Oncology         [phone] ________________________________  Name: Derrick Crosby MRN: [id number]  Date: 07/26/2012  DOB: 26-Jul-1951  End of Treatment Note  Diagnosis:   Lung cancer     Indication for treatment:  Palliative       Radiation treatment dates:   07/05/2012 through 07/26/2012  Site/dose:   The patient was treated with a course of thoracic radiotherapy to the gross disease within the chest. The patient received 37.5 gray in 15 fractions at 2.5 gray per fraction. This was accomplished with a 3-D conformal, 5 field technique. The patient received concurrent chemotherapy during this treatment and image guidance was also utilized on a daily basis.  Narrative: The patient tolerated radiation treatment relatively well.   The patient did not have any significant acute toxicity from the radiation treatment. No worsening shortness of breath and no significant esophagitis. The patient's blood counts did decrease during treatment and this was associated with some nosebleeds which resolved on their own.  Plan: The patient has completed radiation treatment. The patient will return to radiation oncology clinic for routine followup in one month. I advised the patient to call or return sooner if they have any questions or concerns related to their recovery or treatment. ________________________________  [name], M.D., Ph.D.

## 2012-08-02 ENCOUNTER — Encounter: Payer: Self-pay | Admitting: Physician Assistant

## 2012-08-02 ENCOUNTER — Ambulatory Visit: Payer: BC Managed Care – PPO

## 2012-08-02 ENCOUNTER — Ambulatory Visit (HOSPITAL_BASED_OUTPATIENT_CLINIC_OR_DEPARTMENT_OTHER): Payer: BC Managed Care – PPO | Admitting: Physician Assistant

## 2012-08-02 ENCOUNTER — Ambulatory Visit (INDEPENDENT_AMBULATORY_CARE_PROVIDER_SITE_OTHER): Payer: BC Managed Care – PPO | Admitting: Surgery

## 2012-08-02 ENCOUNTER — Other Ambulatory Visit (HOSPITAL_BASED_OUTPATIENT_CLINIC_OR_DEPARTMENT_OTHER): Payer: BC Managed Care – PPO | Admitting: Lab

## 2012-08-02 ENCOUNTER — Encounter: Payer: Self-pay | Admitting: Internal Medicine

## 2012-08-02 ENCOUNTER — Telehealth: Payer: Self-pay | Admitting: Internal Medicine

## 2012-08-02 ENCOUNTER — Encounter (INDEPENDENT_AMBULATORY_CARE_PROVIDER_SITE_OTHER): Payer: Self-pay | Admitting: Surgery

## 2012-08-02 VITALS — BP 124/74 | HR 72 | Temp 97.8°F | Resp 18 | Ht 70.0 in | Wt 160.5 lb

## 2012-08-02 VITALS — BP 122/79 | HR 86 | Temp 97.2°F | Resp 18 | Ht 69.0 in | Wt 159.8 lb

## 2012-08-02 DIAGNOSIS — L723 Sebaceous cyst: Secondary | ICD-10-CM

## 2012-08-02 DIAGNOSIS — R110 Nausea: Secondary | ICD-10-CM

## 2012-08-02 DIAGNOSIS — L089 Local infection of the skin and subcutaneous tissue, unspecified: Secondary | ICD-10-CM

## 2012-08-02 DIAGNOSIS — C3400 Malignant neoplasm of unspecified main bronchus: Secondary | ICD-10-CM

## 2012-08-02 DIAGNOSIS — C3490 Malignant neoplasm of unspecified part of unspecified bronchus or lung: Secondary | ICD-10-CM

## 2012-08-02 DIAGNOSIS — L02222 Furuncle of back [any part, except buttock]: Secondary | ICD-10-CM

## 2012-08-02 DIAGNOSIS — C7970 Secondary malignant neoplasm of unspecified adrenal gland: Secondary | ICD-10-CM

## 2012-08-02 LAB — CBC WITH DIFFERENTIAL/PLATELET
BASO%: 0.3 % (ref 0.0–2.0)
EOS%: 0.9 % (ref 0.0–7.0)
HCT: 32.4 % — ABNORMAL LOW (ref 38.4–49.9)
LYMPH%: 9.6 % — ABNORMAL LOW (ref 14.0–49.0)
MCH: 30.8 pg (ref 27.2–33.4)
MCHC: 35.2 g/dL (ref 32.0–36.0)
MONO%: 14.2 % — ABNORMAL HIGH (ref 0.0–14.0)
NEUT%: 75 % (ref 39.0–75.0)
Platelets: 174 10*3/uL (ref 140–400)
RBC: 3.7 10*6/uL — ABNORMAL LOW (ref 4.20–5.82)
nRBC: 0 % (ref 0–0)

## 2012-08-02 NOTE — Patient Instructions (Signed)
Remove bandage tomorrow in shower Dress with dry dressing

## 2012-08-02 NOTE — Patient Instructions (Addendum)
You have an appointment with a surgeon this afternoon at 3:15, arrive at 3:00 to further evaluate the lesion on your back Your chemotherapy is postponed until next week pending the outcome of your visit with the surgeon. Follow up here in the cancer center in one week

## 2012-08-02 NOTE — Progress Notes (Signed)
Put disability form on nurse's desk. °

## 2012-08-02 NOTE — Progress Notes (Signed)
Subjective:     Patient ID: Derrick Crosby, male   DOB: July 24, 1951, 61 y.o.   MRN: [id number]  HPI   He is referred by Dr.Mohanad for evaluation of a back abscess. The patient has been on chemotherapy and radiation for a bronchogenic carcinoma. He has had an infected sebaceous cyst in the past Review of Systems     Objective:   Physical Exam On exam, there is an infected sebaceous cyst on his back. After obtaining consent, we prepped the area Betadine, anesthetized with lidocaine, made an incision with scalpel, and drain some purulence from the area. I then packed it with dry gauze.    Assessment:     Infected sebaceous cyst status post I&D    Plan:     Wound care instructions were given. I will start him on doxycycline. I wrote him for Vicodin. I will see him back in approximately 2 weeks

## 2012-08-02 NOTE — Telephone Encounter (Signed)
gve the pt's wife the oct 2013 appt calendar. Pt is aware of the appt today with the surgeon@3 :00pm at [facility].

## 2012-08-03 ENCOUNTER — Other Ambulatory Visit: Payer: Self-pay | Admitting: Internal Medicine

## 2012-08-03 DIAGNOSIS — C3490 Malignant neoplasm of unspecified part of unspecified bronchus or lung: Secondary | ICD-10-CM

## 2012-08-03 DIAGNOSIS — C3400 Malignant neoplasm of unspecified main bronchus: Secondary | ICD-10-CM

## 2012-08-04 ENCOUNTER — Encounter: Payer: Self-pay | Admitting: Internal Medicine

## 2012-08-04 NOTE — Progress Notes (Signed)
Put disability form in registration desk. °

## 2012-08-06 NOTE — Progress Notes (Signed)
[facility] [phone]   [phone]  OFFICE PROGRESS NOTE  [name], PA [facility] [address] [place] [address]  DIAGNOSIS: Metastatic non-small cell lung cancer, squamous cell carcinoma diagnosed in August of 2013.  PRIOR THERAPY: status post palliative radiotherapy to the left hilar mass under the care of Dr. Mitzi Hansen.  CURRENT THERAPY:  1) Systemic chemotherapy with carboplatin for AUC of 5 on day 1 and Abraxane 100 mg/M2 on days 1, 8 and 15 every 3 weeks. The patient is status post 1 cycle  INTERVAL HISTORY: Derrick Crosby 61 y.o. male returns to the clinic today for followup visit accompanied his wife and sister-in-law. He tolerated his first cycle of chemotherapy relatively well with the exception of some nausea and dry heaves. He has not been taking his Compazine. He complains of some pain in his mid back after completing the radiation therapy. He is using the lotion as prescribed by radiation therapy. He complains of a large red raised painful and not in his mid back for the past 4-5 days. He states that this not makes it difficult to sleep due to the discomfort. His wife has been applying Neosporin to this area without relief. He reports she's had similar lesions in the past requiring incision and drainage. He had one limits nose and this was handled by Dr. Terri Piedra the dermatologist. His sister-in-law states that he has a previous history of staff infections. He denied any fever or chills. He denied having any significant weight loss or night sweats. He denied having any significant chest pain, shortness of breath except with exertion, no cough or hemoptysis. The patient denied having any significant peripheral neuropathy.  MEDICAL HISTORY: Past Medical History  Diagnosis Date  . Hypertension   . Hyperglyceridemia, pure   . COPD (chronic obstructive pulmonary disease)   . Cancer     Skin  Cancer - Left ear.-  .  Hemoptysis   . Skin cancer     left ear  . Anxiety   . Depression   . Lung cancer 06/13/12    lung mass /right hilar suspicious for primary bronchogenic ca  . Hemoptysis   . Seasonal allergies     ALLERGIES:   has no known allergies.  MEDICATIONS:  Current Outpatient Prescriptions  Medication Sig Dispense Refill  . acetaminophen (TYLENOL) 325 MG tablet Take 325-650 mg by mouth every 6 (six) hours as needed. For general aches      . albuterol (PROVENTIL HFA;VENTOLIN HFA) 108 (90 BASE) MCG/ACT inhaler Inhale 2 puffs into the lungs every 6 (six) hours as needed for wheezing.  1 Inhaler  6  . aspirin 81 MG chewable tablet Chew 81 mg by mouth daily.      . citalopram (CELEXA) 20 MG tablet Take 20 mg by mouth every evening.       . clonazePAM (KLONOPIN) 0.5 MG tablet Take 0.5 mg by mouth 2 (two) times daily as needed. For anxiety      . doxycycline (DORYX) 100 MG EC tablet Take 100 mg by mouth 2 (two) times daily.      . fish oil-omega-3 fatty acids 1000 MG capsule Take 1 g by mouth daily.       Marland Kitchen HYDROcodone-acetaminophen (NORCO/VICODIN) 5-325 MG per tablet Take 1 tablet by mouth every 4 (four) hours as needed.      . lidocaine-prilocaine (EMLA) cream Apply topically as needed. Apply to [facility] A cath site 30-60 min before  chemotherapy  30 g  0  . loratadine (CLARITIN) 10 MG tablet Take 10 mg by mouth daily.      . Multiple Vitamins-Minerals (MULTIVITAMIN WITH MINERALS) tablet Take 1 tablet by mouth daily.      . prochlorperazine (COMPAZINE) 10 MG tablet TAKE 1 TABLET (10 MG TOTAL) BY MOUTH EVERY 6 (SIX) HOURS AS NEEDED.  60 tablet  3  . sucralfate (CARAFATE) 1 G tablet Take 1 tablet (1 g total) by mouth 4 (four) times daily. Dissolve in 15 cc water.  120 tablet  2  . triamcinolone cream (KENALOG) 0.1 % Apply topically 2 (two) times daily.        SURGICAL HISTORY:  Past Surgical History  Procedure Date  . Tympanoplasty   . Video bronchoscopy 06/13/12    with endobronchial u/s/ with biospy  left hilar mass/Dr. Lonia Farber  . Tonsillectomy     child    REVIEW OF SYSTEMS:  A comprehensive review of systems was negative except for: Constitutional: positive for fatigue Respiratory: positive for dyspnea on exertion Gastrointestinal: positive for nausea   PHYSICAL EXAMINATION: General appearance: alert, cooperative and no distress Head: Normocephalic, without obvious abnormality, atraumatic Neck: no adenopathy Lymph nodes: Cervical, supraclavicular, and axillary nodes normal. Resp: clear to auscultation bilaterally Cardio: regular rate and rhythm, S1, S2 normal, no murmur, click, rub or gallop GI: soft, non-tender; bowel sounds normal; no masses,  no organomegaly Extremities: extremities normal, atraumatic, no cyanosis or edema Neurologic: Alert and oriented X 3, normal strength and tone. Normal symmetric reflexes. Normal coordination and gait Skin: The right lower mid back there is an approximately 2 cm by approximately 3/4 of a centimeter raised red firm tender lesion. The center is minimally fluctuant, with no active drainage is blood or purulent material.  ECOG PERFORMANCE STATUS: 1 - Symptomatic but completely ambulatory  Blood pressure 124/74, pulse 72, temperature 97.8 F (36.6 C), temperature source Oral, resp. rate 18, height 5\' 10"  (1.778 m), weight 160 lb 8 oz (72.802 kg).  LABORATORY DATA: Lab Results  Component Value Date   WBC 7.5 08/02/2012   HGB 11.4* 08/02/2012   HCT 32.4* 08/02/2012   MCV 87.6 08/02/2012   PLT 174 08/02/2012      Chemistry      Component Value Date/Time   NA 138 07/19/2012 1028   NA 141 06/27/2012 0904   K 3.9 07/19/2012 1028   K 4.4 06/27/2012 0904   CL 107 07/19/2012 1028   CL 102 06/27/2012 0904   CO2 21* 07/19/2012 1028   CO2 28 06/27/2012 0904   BUN 19.0 07/19/2012 1028   BUN 17 06/27/2012 0904   CREATININE 1.1 07/19/2012 1028   CREATININE 1.06 06/27/2012 0904      Component Value Date/Time   CALCIUM 8.9 07/19/2012 1028    CALCIUM 9.5 06/27/2012 0904   ALKPHOS 124 07/19/2012 1028   ALKPHOS 130* 06/27/2012 0904   AST 23 07/19/2012 1028   AST 28 06/27/2012 0904   ALT 32 07/19/2012 1028   ALT 29 06/27/2012 0904   BILITOT 0.50 07/19/2012 1028   BILITOT 0.5 06/27/2012 1610       RADIOGRAPHIC STUDIES: Mr Laqueta Jean RU Contrast  24-Jun-2012  *RADIOLOGY REPORT*  Clinical Data: 61 year old male with new diagnosis of lung cancer. Staging.  Uncontrollable cough.  MRI HEAD WITHOUT AND WITH CONTRAST  Technique:  Multiplanar, multiecho pulse sequences of the brain and surrounding structures were obtained according to standard protocol without and with intravenous contrast  Contrast:  15mL MULTIHANCE GADOBENATE DIMEGLUMINE 529 MG/ML IV SOLN  Comparison: None.  Findings: Motion artifact on postcontrast images. No abnormal enhancement identified.  No restricted diffusion to suggest acute infarction.  No midline shift, mass effect, evidence of mass lesion, ventriculomegaly, extra-axial collection or acute intracranial hemorrhage.  Cervicomedullary junction and pituitary are within normal limits.  Major intracranial vascular flow voids are preserved. Wallace Cullens and white matter signal is within normal limits throughout the brain.  Grossly negative visualized cervical spine.  Visualized bone marrow signal is within normal limits.  Visualized orbit soft tissues are within normal limits.  Visualized paranasal sinuses and mastoids are clear.  Negative scalp soft tissues.  IMPRESSION: Postcontrast images degraded by motion despite repeated imaging attempts. No acute or metastatic intracranial abnormality identified.  Original Report Authenticated By: Harley Hallmark, M.D.   Nm Pet Image Initial (pi) Skull Base To Thigh  06/16/2012  *RADIOLOGY REPORT*  Clinical Data: Initial treatment strategy for lung cancer.  NUCLEAR MEDICINE PET SKULL BASE TO THIGH  Fasting Blood Glucose:  92  Technique:  19.4 mCi F-18 FDG was injected intravenously. CT data was obtained and  used for attenuation correction and anatomic localization only.  (This was not acquired as a diagnostic CT examination.) Additional exam technical data entered on technologist worksheet.  Comparison:  08/07/2012  Findings:  Neck: No hypermetabolic lymph nodes in the neck.  Chest:  Large central left lung mass along the minor fissure involves the upper lobe, lower lobe, and middle mediastinum, with maximum standard uptake value of 17.6.  Small prevascular node measuring 9 mm in short axis diameter has a maximum standard uptake value of 6.1.  Emphysema is present.  Postobstructive pneumonitis noted in the left lower lobe, with complete collapse of the left lower lobe bronchus due to the dominant mass.  Abdomen/Pelvis:  1.3 cm right adrenal mass has a maximum standard uptake value of 11.8, compatible with metastatic disease.  A 1.4 cm left adrenal mass has a maximum standard uptake value of 11.7 cm, compatible with malignancy.  Small gastric diverticulum noted posteriorly.  Aortoiliac atherosclerotic calcification noted.  A small umbilical hernia contains adipose tissue.  High activity along the descending colon is most likely physiologic.  Bilateral nonobstructive nephrolithiasis noted.  Several hypodense liver lesions are probably cysts although are technically nonspecific.  No definite hypermetabolic activity in the liver is observed.  [place]:  No focal hypermetabolic activity to suggest skeletal metastasis.  IMPRESSION:  1.  Large hypermetabolic central left hilar mass with medial mediastinal invasion and a pathologic hypermetabolic prevascular lymph node. 2.  Bilateral adrenal metastatic lesions. 3.  Bilateral nonobstructive nephrolithiasis.  Original Report Authenticated By: [name], M.D.   Ir Fluoro Guide Cv Line Right  06/30/2012  *RADIOLOGY REPORT*  Clinical Data/Indication: Lung cancer  TUNNEL POWER PORT PLACEMENT WITH SUBCUTANEOUS POCKET UTILIZING ULTRASOUND & FLOUROSCOPY  Sedation: Versed  2.0 mg, Fentanyl 200 mcg.  Total Moderate Sedation Time: 35 minutes.  As antibiotic prophylaxis, Ancef  was ordered pre-procedure and administered intravenously within one hour of incision.  Fluoroscopy Time: 0.4 minutes.  Procedure:  After written informed consent was obtained, patient was placed in the supine position on angiographic table. The right neck and chest was prepped and draped in a sterile fashion. Lidocaine was utilized for local anesthesia.  The right internal jugular vein was noted to be patent initially with ultrasound. Under sonographic guidance, a micropuncture needle was inserted into the right IJ vein (Ultrasound and fluoroscopic image documentation was performed). The  needle was removed over an 018 wire which was exchanged for a Amplatz.  This was advanced into the IVC.  An 8-French dilator was advanced over the Amplatz.  A small incision was made in the right upper chest over the anterior right second rib.  Utilizing blunt dissection, a subcutaneous pocket was created in the caudal direction. The pocket was irrigated with a copious amount of sterile normal saline.  The port catheter was tunneled from the chest incision, and out the neck incision.  The reservoir was inserted into the subcutaneous pocket and secured with two 3-0 Ethilon stitches.  A peel-away sheath was advanced over the Amplatz wire.  The port catheter was cut to measure length and inserted through the peel-away sheath. The peel-away sheath was removed.  The chest incision was closed with 3-0 Vicryl interrupted stitches for the subcutaneous tissue and a running of 4-0 Vicryl subcuticular stitch for the skin.  The neck incision was closed with a 4-0 Vicryl subcuticular stitch. Derma-bond was applied to both surgical incisions.  The port reservoir was flushed and instilled with heparinized saline.  No complications.  Findings:  A right IJ vein Port-A-Cath is in place with its tip at the cavoatrial junction.  IMPRESSION:  Successful 8 French right internal jugular vein power port placement with its tip at the SVC/RA junction.   Original Report Authenticated By: Donavan Burnet, M.D.    Ir US Guide Vasc Access Right  06/30/2012  *RADIOLOGY REPORT*  Clinical Data/Indication: Lung cancer  TUNNEL POWER PORT PLACEMENT WITH SUBCUTANEOUS POCKET UTILIZING ULTRASOUND & FLOUROSCOPY  Sedation: Versed 2.0 mg, Fentanyl 200 mcg.  Total Moderate Sedation Time: 35 minutes.  As antibiotic prophylaxis, Ancef  was ordered pre-procedure and administered intravenously within one hour of incision.  Fluoroscopy Time: 0.4 minutes.  Procedure:  After written informed consent was obtained, patient was placed in the supine position on angiographic table. The right neck and chest was prepped and draped in a sterile fashion. Lidocaine was utilized for local anesthesia.  The right internal jugular vein was noted to be patent initially with ultrasound. Under sonographic guidance, a micropuncture needle was inserted into the right IJ vein (Ultrasound and fluoroscopic image documentation was performed). The needle was removed over an 018 wire which was exchanged for a Amplatz.  This was advanced into the IVC.  An 8-French dilator was advanced over the Amplatz.  A small incision was made in the right upper chest over the anterior right second rib.  Utilizing blunt dissection, a subcutaneous pocket was created in the caudal direction. The pocket was irrigated with a copious amount of sterile normal saline.  The port catheter was tunneled from the chest incision, and out the neck incision.  The reservoir was inserted into the subcutaneous pocket and secured with two 3-0 Ethilon stitches.  A peel-away sheath was advanced over the Amplatz wire.  The port catheter was cut to measure length and inserted through the peel-away sheath. The peel-away sheath was removed.  The chest incision was closed with 3-0 Vicryl interrupted stitches for the subcutaneous tissue and a  running of 4-0 Vicryl subcuticular stitch for the skin.  The neck incision was closed with a 4-0 Vicryl subcuticular stitch. Derma-bond was applied to both surgical incisions.  The port reservoir was flushed and instilled with heparinized saline.  No complications.  Findings:  A right IJ vein Port-A-Cath is in place with its tip at the cavoatrial junction.  IMPRESSION: Successful 8 French right internal jugular vein power  port placement with its tip at the SVC/RA junction.   Original Report Authenticated By: Donavan Burnet, M.D.     ASSESSMENT/PLAN: This is a very pleasant 61 years old white male with metastatic non-small cell lung cancer, squamous cell carcinoma. He is status post palliative radiotherapy to the left hilar mass. He is status post one cycle of systemic chemotherapy with carboplatin and Abraxane. The patient is tolerating his treatment fairly well except for mild nausea. Patient was discussed with Dr. Arbutus Ped. In view of this "boil" in the patient's right mid back, this represents infection that is best handled by a surgeon who may need to I&D this in place the patient on antibiotics. We have arranged for the patient to see Dr. Rayburn Ma later this afternoon for further evaluation and management. We will postpone his chemotherapy for one week while this is handled and reevaluate him when he returns to see if we can proceed at that time.  Laural Benes, Branon Sabine E, PA-C   All questions were answered. The patient knows to call the clinic with any problems, questions or concerns. We can certainly see the patient much sooner if necessary.  I spent 20 minutes counseling the patient face to face. The total time spent in the appointment was 30 minutes.

## 2012-08-09 ENCOUNTER — Telehealth: Payer: Self-pay | Admitting: Internal Medicine

## 2012-08-09 ENCOUNTER — Other Ambulatory Visit: Payer: BC Managed Care – PPO

## 2012-08-09 ENCOUNTER — Ambulatory Visit: Payer: BC Managed Care – PPO | Admitting: Physician Assistant

## 2012-08-09 ENCOUNTER — Ambulatory Visit (HOSPITAL_BASED_OUTPATIENT_CLINIC_OR_DEPARTMENT_OTHER): Payer: BC Managed Care – PPO | Admitting: Physician Assistant

## 2012-08-09 ENCOUNTER — Ambulatory Visit (HOSPITAL_BASED_OUTPATIENT_CLINIC_OR_DEPARTMENT_OTHER): Payer: BC Managed Care – PPO

## 2012-08-09 ENCOUNTER — Ambulatory Visit: Payer: BC Managed Care – PPO

## 2012-08-09 ENCOUNTER — Other Ambulatory Visit: Payer: BC Managed Care – PPO | Admitting: Lab

## 2012-08-09 ENCOUNTER — Other Ambulatory Visit (HOSPITAL_BASED_OUTPATIENT_CLINIC_OR_DEPARTMENT_OTHER): Payer: BC Managed Care – PPO | Admitting: Lab

## 2012-08-09 VITALS — BP 112/64 | HR 65 | Temp 98.4°F | Resp 18 | Ht 69.0 in | Wt 161.8 lb

## 2012-08-09 DIAGNOSIS — C3490 Malignant neoplasm of unspecified part of unspecified bronchus or lung: Secondary | ICD-10-CM

## 2012-08-09 DIAGNOSIS — C3400 Malignant neoplasm of unspecified main bronchus: Secondary | ICD-10-CM

## 2012-08-09 DIAGNOSIS — Z5111 Encounter for antineoplastic chemotherapy: Secondary | ICD-10-CM

## 2012-08-09 LAB — CBC WITH DIFFERENTIAL/PLATELET
BASO%: 0.4 % (ref 0.0–2.0)
EOS%: 2.3 % (ref 0.0–7.0)
Eosinophils Absolute: 0.1 10*3/uL (ref 0.0–0.5)
LYMPH%: 21.9 % (ref 14.0–49.0)
MCH: 30.8 pg (ref 27.2–33.4)
MCHC: 35.3 g/dL (ref 32.0–36.0)
MCV: 87.1 fL (ref 79.3–98.0)
MONO%: 12.6 % (ref 0.0–14.0)
NEUT#: 3 10*3/uL (ref 1.5–6.5)
Platelets: 200 10*3/uL (ref 140–400)
RBC: 3.64 10*6/uL — ABNORMAL LOW (ref 4.20–5.82)
RDW: 14.8 % — ABNORMAL HIGH (ref 11.0–14.6)
nRBC: 0 % (ref 0–0)

## 2012-08-09 LAB — COMPREHENSIVE METABOLIC PANEL (CC13)
ALT: 23 U/L (ref 0–55)
Alkaline Phosphatase: 115 U/L (ref 40–150)
CO2: 22 mEq/L (ref 22–29)
Creatinine: 1 mg/dL (ref 0.7–1.3)
Sodium: 137 mEq/L (ref 136–145)
Total Bilirubin: 0.5 mg/dL (ref 0.20–1.20)
Total Protein: 6.9 g/dL (ref 6.4–8.3)

## 2012-08-09 MED ORDER — SODIUM CHLORIDE 0.9 % IV SOLN
Freq: Once | INTRAVENOUS | Status: AC
  Administered 2012-08-09: 14:00:00 via INTRAVENOUS

## 2012-08-09 MED ORDER — SODIUM CHLORIDE 0.9 % IJ SOLN
10.0000 mL | INTRAMUSCULAR | Status: DC | PRN
  Administered 2012-08-09: 10 mL
  Filled 2012-08-09: qty 10

## 2012-08-09 MED ORDER — PACLITAXEL PROTEIN-BOUND CHEMO INJECTION 100 MG
90.0000 mg/m2 | Freq: Once | INTRAVENOUS | Status: AC
  Administered 2012-08-09: 175 mg via INTRAVENOUS
  Filled 2012-08-09: qty 35

## 2012-08-09 MED ORDER — DEXAMETHASONE SODIUM PHOSPHATE 4 MG/ML IJ SOLN
20.0000 mg | Freq: Once | INTRAMUSCULAR | Status: AC
  Administered 2012-08-09: 20 mg via INTRAVENOUS

## 2012-08-09 MED ORDER — HEPARIN SOD (PORK) LOCK FLUSH 100 UNIT/ML IV SOLN
500.0000 [IU] | Freq: Once | INTRAVENOUS | Status: AC | PRN
  Administered 2012-08-09: 500 [IU]
  Filled 2012-08-09: qty 5

## 2012-08-09 MED ORDER — ONDANSETRON 16 MG/50ML IVPB (CHCC)
16.0000 mg | Freq: Once | INTRAVENOUS | Status: AC
  Administered 2012-08-09: 16 mg via INTRAVENOUS

## 2012-08-09 MED ORDER — SODIUM CHLORIDE 0.9 % IV SOLN
476.5000 mg | Freq: Once | INTRAVENOUS | Status: AC
  Administered 2012-08-09: 480 mg via INTRAVENOUS
  Filled 2012-08-09: qty 48

## 2012-08-09 NOTE — Patient Instructions (Signed)
[facility] Discharge Instructions for Patients Receiving Chemotherapy  Today you received the following chemotherapy agents: abraxane, carboplatin  To help prevent nausea and vomiting after your treatment, we encourage you to take your nausea medicatio.  Take it as often as prescribed.     If you develop nausea and vomiting that is not controlled by your nausea medication, call the clinic. If it is after clinic hours your family physician or the after hours number for the clinic or go to the Emergency Department.   BELOW ARE SYMPTOMS THAT SHOULD BE REPORTED IMMEDIATELY:  *FEVER GREATER THAN 100.5 F  *CHILLS WITH OR WITHOUT FEVER  NAUSEA AND VOMITING THAT IS NOT CONTROLLED WITH YOUR NAUSEA MEDICATION  *UNUSUAL SHORTNESS OF BREATH  *UNUSUAL BRUISING OR BLEEDING  TENDERNESS IN MOUTH AND THROAT WITH OR WITHOUT PRESENCE OF ULCERS  *URINARY PROBLEMS  *BOWEL PROBLEMS  UNUSUAL RASH Items with * indicate a potential emergency and should be followed up as soon as possible.  Feel free to call the clinic you have any questions or concerns. The clinic phone number is [phone].   I have been informed and understand all the instructions given to me. I know to contact the clinic, my physician, or go to the Emergency Department if any problems should occur. I do not have any questions at this time, but understand that I may call the clinic during office hours   should I have any questions or need assistance in obtaining follow up care.    __________________________________________  _____________  __________ Signature of Patient or Authorized Representative            Date                   Time    __________________________________________ Nurse's Signature

## 2012-08-09 NOTE — Telephone Encounter (Signed)
Gave pt appt calendar for October 2013 lab, chemo and ML °

## 2012-08-09 NOTE — Progress Notes (Signed)
[facility] Telephone:([phone]   Fax:([phone]) [phone]  OFFICE PROGRESS NOTE  [name], PA [facility] [address] [place] [address]  DIAGNOSIS: Metastatic non-small cell lung cancer, squamous cell carcinoma diagnosed in August of 2013.  PRIOR THERAPY: status post palliative radiotherapy to the left hilar mass under the care of Dr. Mitzi Hansen.  CURRENT THERAPY:  1) Systemic chemotherapy with carboplatin for AUC of 5 on day 1 and Abraxane 100 mg/M2 on days 1, 8 and 15 every 3 weeks. The patient is status post 1 cycle  INTERVAL HISTORY: Derrick Crosby 61 y.o. male returns to the clinic today for followup visit accompanied his wife and sister-in-law. He tolerated his first cycle of chemotherapy relatively well with the exception of some nausea and dry heaves. He has not been taking his Compazine. His chemotherapy was postponed one week due to an abscess on his left mid back. We've referred him to Dr. Magnus Ivan, a surgeon, to have this evaluated. The abscess required incision and drainage and he was placed on a course of doxycycline and Vicodin. He states this area feels significantly better. He presents to proceed with cycle #2 of his systemic chemotherapy with carboplatin and Abraxane.  He denied any fever or chills. He denied having any significant weight loss or night sweats. He denied having any significant chest pain, shortness of breath except with exertion, no cough or hemoptysis. The patient denied having any significant peripheral neuropathy.  MEDICAL HISTORY: Past Medical History  Diagnosis Date  . Hypertension   . Hyperglyceridemia, pure   . COPD (chronic obstructive pulmonary disease)   . Cancer     Skin  Cancer - Left ear.-  . Hemoptysis   . Skin cancer     left ear  . Anxiety   . Depression   . Lung cancer 06/13/12    lung mass /right hilar suspicious for primary bronchogenic ca  . Hemoptysis   . Seasonal allergies     ALLERGIES:    has no known allergies.  MEDICATIONS:  Current Outpatient Prescriptions  Medication Sig Dispense Refill  . acetaminophen (TYLENOL) 325 MG tablet Take 325-650 mg by mouth every 6 (six) hours as needed. For general aches      . albuterol (PROVENTIL HFA;VENTOLIN HFA) 108 (90 BASE) MCG/ACT inhaler Inhale 2 puffs into the lungs every 6 (six) hours as needed for wheezing.  1 Inhaler  6  . aspirin 81 MG chewable tablet Chew 81 mg by mouth daily.      . citalopram (CELEXA) 20 MG tablet Take 20 mg by mouth every evening.       . clonazePAM (KLONOPIN) 0.5 MG tablet Take 0.5 mg by mouth 2 (two) times daily as needed. For anxiety      . doxycycline (DORYX) 100 MG EC tablet Take 100 mg by mouth 2 (two) times daily.      . fish oil-omega-3 fatty acids 1000 MG capsule Take 1 g by mouth daily.       Marland Kitchen HYDROcodone-acetaminophen (NORCO/VICODIN) 5-325 MG per tablet Take 1 tablet by mouth every 4 (four) hours as needed.      . lidocaine-prilocaine (EMLA) cream Apply topically as needed. Apply to [facility] A cath site 30-60 min before chemotherapy  30 g  0  . loratadine (CLARITIN) 10 MG tablet Take 10 mg by mouth daily.      . Multiple Vitamins-Minerals (MULTIVITAMIN WITH MINERALS) tablet Take 1 tablet by mouth daily.      Marland Kitchen  prochlorperazine (COMPAZINE) 10 MG tablet TAKE 1 TABLET (10 MG TOTAL) BY MOUTH EVERY 6 (SIX) HOURS AS NEEDED.  60 tablet  3  . sucralfate (CARAFATE) 1 G tablet Take 1 tablet (1 g total) by mouth 4 (four) times daily. Dissolve in 15 cc water.  120 tablet  2  . triamcinolone cream (KENALOG) 0.1 % Apply topically 2 (two) times daily.       No current facility-administered medications for this visit.   Facility-Administered Medications Ordered in Other Visits  Medication Dose Route Frequency Provider Last Rate Last Dose  . 0.9 %  sodium chloride infusion   Intravenous Once [name], MD      . CARBOplatin (PARAPLATIN) 480 mg in sodium chloride 0.9 % 250 mL chemo infusion  480 mg Intravenous  Once [name], MD   480 mg at 08/09/12 1550  . dexamethasone (DECADRON) injection 20 mg  20 mg Intravenous Once [name], MD   20 mg at 08/09/12 1348  . heparin lock flush 100 unit/mL  500 Units Intracatheter Once PRN [name], MD   500 Units at 08/09/12 1630  . ondansetron (ZOFRAN) IVPB 16 mg  16 mg Intravenous Once [name], MD   16 mg at 08/09/12 1348  . PACLitaxel-protein bound (ABRAXANE) chemo infusion 175 mg  90 mg/m2 (Treatment Plan Actual) Intravenous Once [name], MD   175 mg at 08/09/12 1452  . sodium chloride 0.9 % injection 10 mL  10 mL Intracatheter PRN [name], MD   10 mL at 08/09/12 1630    SURGICAL HISTORY:  Past Surgical History  Procedure Date  . Tympanoplasty   . Video bronchoscopy 06/13/12    with endobronchial u/s/ with biospy left hilar mass/Dr. Lonia Farber  . Tonsillectomy     child    REVIEW OF SYSTEMS:  A comprehensive review of systems was negative except for: Constitutional: positive for fatigue Respiratory: positive for dyspnea on exertion Gastrointestinal: positive for nausea   PHYSICAL EXAMINATION: General appearance: alert, cooperative and no distress Head: Normocephalic, without obvious abnormality, atraumatic Neck: no adenopathy Lymph nodes: Cervical, supraclavicular, and axillary nodes normal. Resp: clear to auscultation bilaterally Cardio: regular rate and rhythm, S1, S2 normal, no murmur, click, rub or gallop GI: soft, non-tender; bowel sounds normal; no masses,  no organomegaly Extremities: extremities normal, atraumatic, no cyanosis or edema Neurologic: Alert and oriented X 3, normal strength and tone. Normal symmetric reflexes. Normal coordination and gait Skin: The left  lower mid back there is an approximately 1cm area of erythema with a central soft eschar. No active bleeding or purulent drainage. The area is nontender. This is a vast improvement compared to this area one week ago.  ECOG  PERFORMANCE STATUS: 1 - Symptomatic but completely ambulatory  Blood pressure 112/64, pulse 65, temperature 98.4 F (36.9 C), temperature source Oral, resp. rate 18, height 5\' 9"  (1.753 m), weight 161 lb 12.8 oz (73.392 kg).  LABORATORY DATA: Lab Results  Component Value Date   WBC 4.8 08/09/2012   HGB 11.2* 08/09/2012   HCT 31.7* 08/09/2012   MCV 87.1 08/09/2012   PLT 200 08/09/2012      Chemistry      Component Value Date/Time   NA 137 08/09/2012 1022   NA 141 06/27/2012 0904   K 4.1 08/09/2012 1022   K 4.4 06/27/2012 0904   CL 104 08/09/2012 1022   CL 102 06/27/2012 0904   CO2 22 08/09/2012 1022   CO2 28 06/27/2012 0904  BUN 31.0* 08/09/2012 1022   BUN 17 06/27/2012 0904   CREATININE 1.0 08/09/2012 1022   CREATININE 1.06 06/27/2012 0904      Component Value Date/Time   CALCIUM 9.3 08/09/2012 1022   CALCIUM 9.5 06/27/2012 0904   ALKPHOS 115 08/09/2012 1022   ALKPHOS 130* 06/27/2012 0904   AST 20 08/09/2012 1022   AST 28 06/27/2012 0904   ALT 23 08/09/2012 1022   ALT 29 06/27/2012 0904   BILITOT 0.50 08/09/2012 1022   BILITOT 0.5 06/27/2012 4098       RADIOGRAPHIC STUDIES: Mr Laqueta Jean JX Contrast  06-26-2012  *RADIOLOGY REPORT*  Clinical Data: 61 year old male with new diagnosis of lung cancer. Staging.  Uncontrollable cough.  MRI HEAD WITHOUT AND WITH CONTRAST  Technique:  Multiplanar, multiecho pulse sequences of the brain and surrounding structures were obtained according to standard protocol without and with intravenous contrast  Contrast: 15mL MULTIHANCE GADOBENATE DIMEGLUMINE 529 MG/ML IV SOLN  Comparison: None.  Findings: Motion artifact on postcontrast images. No abnormal enhancement identified.  No restricted diffusion to suggest acute infarction.  No midline shift, mass effect, evidence of mass lesion, ventriculomegaly, extra-axial collection or acute intracranial hemorrhage.  Cervicomedullary junction and pituitary are within normal limits.  Major intracranial vascular flow voids are  preserved. Wallace Cullens and white matter signal is within normal limits throughout the brain.  Grossly negative visualized cervical spine.  Visualized bone marrow signal is within normal limits.  Visualized orbit soft tissues are within normal limits.  Visualized paranasal sinuses and mastoids are clear.  Negative scalp soft tissues.  IMPRESSION: Postcontrast images degraded by motion despite repeated imaging attempts. No acute or metastatic intracranial abnormality identified.  Original Report Authenticated By: Harley Hallmark, M.D.   Nm Pet Image Initial (pi) Skull Base To Thigh  06/16/2012  *RADIOLOGY REPORT*  Clinical Data: Initial treatment strategy for lung cancer.  NUCLEAR MEDICINE PET SKULL BASE TO THIGH  Fasting Blood Glucose:  92  Technique:  19.4 mCi F-18 FDG was injected intravenously. CT data was obtained and used for attenuation correction and anatomic localization only.  (This was not acquired as a diagnostic CT examination.) Additional exam technical data entered on technologist worksheet.  Comparison:  08/07/2012  Findings:  Neck: No hypermetabolic lymph nodes in the neck.  Chest:  Large central left lung mass along the minor fissure involves the upper lobe, lower lobe, and middle mediastinum, with maximum standard uptake value of 17.6.  Small prevascular node measuring 9 mm in short axis diameter has a maximum standard uptake value of 6.1.  Emphysema is present.  Postobstructive pneumonitis noted in the left lower lobe, with complete collapse of the left lower lobe bronchus due to the dominant mass.  Abdomen/Pelvis:  1.3 cm right adrenal mass has a maximum standard uptake value of 11.8, compatible with metastatic disease.  A 1.4 cm left adrenal mass has a maximum standard uptake value of 11.7 cm, compatible with malignancy.  Small gastric diverticulum noted posteriorly.  Aortoiliac atherosclerotic calcification noted.  A small umbilical hernia contains adipose tissue.  High activity along the descending  colon is most likely physiologic.  Bilateral nonobstructive nephrolithiasis noted.  Several hypodense liver lesions are probably cysts although are technically nonspecific.  No definite hypermetabolic activity in the liver is observed.  [place]:  No focal hypermetabolic activity to suggest skeletal metastasis.  IMPRESSION:  1.  Large hypermetabolic central left hilar mass with medial mediastinal invasion and a pathologic hypermetabolic prevascular lymph node. 2.  Bilateral adrenal  metastatic lesions. 3.  Bilateral nonobstructive nephrolithiasis.  Original Report Authenticated By: [name], M.D.   Ir Fluoro Guide Cv Line Right  06/30/2012  *RADIOLOGY REPORT*  Clinical Data/Indication: Lung cancer  TUNNEL POWER PORT PLACEMENT WITH SUBCUTANEOUS POCKET UTILIZING ULTRASOUND & FLOUROSCOPY  Sedation: Versed 2.0 mg, Fentanyl 200 mcg.  Total Moderate Sedation Time: 35 minutes.  As antibiotic prophylaxis, Ancef  was ordered pre-procedure and administered intravenously within one hour of incision.  Fluoroscopy Time: 0.4 minutes.  Procedure:  After written informed consent was obtained, patient was placed in the supine position on angiographic table. The right neck and chest was prepped and draped in a sterile fashion. Lidocaine was utilized for local anesthesia.  The right internal jugular vein was noted to be patent initially with ultrasound. Under sonographic guidance, a micropuncture needle was inserted into the right IJ vein (Ultrasound and fluoroscopic image documentation was performed). The needle was removed over an 018 wire which was exchanged for a Amplatz.  This was advanced into the IVC.  An 8-French dilator was advanced over the Amplatz.  A small incision was made in the right upper chest over the anterior right second rib.  Utilizing blunt dissection, a subcutaneous pocket was created in the caudal direction. The pocket was irrigated with a copious amount of sterile normal saline.  The port catheter  was tunneled from the chest incision, and out the neck incision.  The reservoir was inserted into the subcutaneous pocket and secured with two 3-0 Ethilon stitches.  A peel-away sheath was advanced over the Amplatz wire.  The port catheter was cut to measure length and inserted through the peel-away sheath. The peel-away sheath was removed.  The chest incision was closed with 3-0 Vicryl interrupted stitches for the subcutaneous tissue and a running of 4-0 Vicryl subcuticular stitch for the skin.  The neck incision was closed with a 4-0 Vicryl subcuticular stitch. Derma-bond was applied to both surgical incisions.  The port reservoir was flushed and instilled with heparinized saline.  No complications.  Findings:  A right IJ vein Port-A-Cath is in place with its tip at the cavoatrial junction.  IMPRESSION: Successful 8 French right internal jugular vein power port placement with its tip at the SVC/RA junction.   Original Report Authenticated By: Donavan Burnet, M.D.    Ir US Guide Vasc Access Right  06/30/2012  *RADIOLOGY REPORT*  Clinical Data/Indication: Lung cancer  TUNNEL POWER PORT PLACEMENT WITH SUBCUTANEOUS POCKET UTILIZING ULTRASOUND & FLOUROSCOPY  Sedation: Versed 2.0 mg, Fentanyl 200 mcg.  Total Moderate Sedation Time: 35 minutes.  As antibiotic prophylaxis, Ancef  was ordered pre-procedure and administered intravenously within one hour of incision.  Fluoroscopy Time: 0.4 minutes.  Procedure:  After written informed consent was obtained, patient was placed in the supine position on angiographic table. The right neck and chest was prepped and draped in a sterile fashion. Lidocaine was utilized for local anesthesia.  The right internal jugular vein was noted to be patent initially with ultrasound. Under sonographic guidance, a micropuncture needle was inserted into the right IJ vein (Ultrasound and fluoroscopic image documentation was performed). The needle was removed over an 018 wire which was exchanged  for a Amplatz.  This was advanced into the IVC.  An 8-French dilator was advanced over the Amplatz.  A small incision was made in the right upper chest over the anterior right second rib.  Utilizing blunt dissection, a subcutaneous pocket was created in the caudal direction. The pocket was irrigated  with a copious amount of sterile normal saline.  The port catheter was tunneled from the chest incision, and out the neck incision.  The reservoir was inserted into the subcutaneous pocket and secured with two 3-0 Ethilon stitches.  A peel-away sheath was advanced over the Amplatz wire.  The port catheter was cut to measure length and inserted through the peel-away sheath. The peel-away sheath was removed.  The chest incision was closed with 3-0 Vicryl interrupted stitches for the subcutaneous tissue and a running of 4-0 Vicryl subcuticular stitch for the skin.  The neck incision was closed with a 4-0 Vicryl subcuticular stitch. Derma-bond was applied to both surgical incisions.  The port reservoir was flushed and instilled with heparinized saline.  No complications.  Findings:  A right IJ vein Port-A-Cath is in place with its tip at the cavoatrial junction.  IMPRESSION: Successful 8 French right internal jugular vein power port placement with its tip at the SVC/RA junction.   Original Report Authenticated By: Donavan Burnet, M.D.     ASSESSMENT/PLAN: This is a very pleasant 61 years old white male with metastatic non-small cell lung cancer, squamous cell carcinoma. He is status post palliative radiotherapy to the left hilar mass. He is status post one cycle of systemic chemotherapy with carboplatin and Abraxane. The patient is tolerating his treatment fairly well except for mild nausea. Patient was discussed with Dr. Arbutus Ped. He is status post I&D of the abscess on his left mid back. He is completing his course of doxycycline as directed by Dr. Rayburn Ma. He'll proceed with cycle #2 of his systemic chemotherapy with  carboplatin for an AUC of 5 on day 1 and Abraxane 100 mg per meter square given on days 1, 8 and 15 every 3 weeks. He'll return in 3 weeks try to cycle #3 with a repeat CBC differential and C. met.   Laural Benes, Anaaya Fuster E, PA-C   All questions were answered. The patient knows to call the clinic with any problems, questions or concerns. We can certainly see the patient much sooner if necessary.  I spent 20 minutes counseling the patient face to face. The total time spent in the appointment was 30 minutes.

## 2012-08-09 NOTE — Patient Instructions (Addendum)
Continue labs and chemotherapy as scheduled Follow up in 3 weeks prior to your next cycle of chemotherapy

## 2012-08-09 NOTE — Telephone Encounter (Signed)
Pt came in,had a mix up with the cal/sch.  Aj will be able to see them sooner and chemo will see what they can do.  Pt/wife aware   aom

## 2012-08-10 ENCOUNTER — Other Ambulatory Visit: Payer: Self-pay | Admitting: Medical Oncology

## 2012-08-10 DIAGNOSIS — T451X5A Adverse effect of antineoplastic and immunosuppressive drugs, initial encounter: Secondary | ICD-10-CM

## 2012-08-10 DIAGNOSIS — R112 Nausea with vomiting, unspecified: Secondary | ICD-10-CM

## 2012-08-10 MED ORDER — ONDANSETRON HCL 8 MG PO TABS: 8.0000 mg | ORAL_TABLET | Freq: Three times a day (TID) | ORAL | Status: DC | PRN

## 2012-08-10 NOTE — Telephone Encounter (Signed)
Chemo wed, dry heaves today -compazine not effective . I called in zofran and wife notified.I instructed her to make sure he is taking sips of fluid every 30 minutes and keeping hydrated. I told her to call if symptoms worsen.

## 2012-08-16 ENCOUNTER — Other Ambulatory Visit (HOSPITAL_BASED_OUTPATIENT_CLINIC_OR_DEPARTMENT_OTHER): Payer: BC Managed Care – PPO

## 2012-08-16 ENCOUNTER — Ambulatory Visit (HOSPITAL_BASED_OUTPATIENT_CLINIC_OR_DEPARTMENT_OTHER): Payer: BC Managed Care – PPO

## 2012-08-16 VITALS — BP 105/65 | HR 66 | Temp 98.2°F | Resp 20

## 2012-08-16 DIAGNOSIS — Z5111 Encounter for antineoplastic chemotherapy: Secondary | ICD-10-CM

## 2012-08-16 DIAGNOSIS — C3400 Malignant neoplasm of unspecified main bronchus: Secondary | ICD-10-CM

## 2012-08-16 DIAGNOSIS — C3490 Malignant neoplasm of unspecified part of unspecified bronchus or lung: Secondary | ICD-10-CM

## 2012-08-16 LAB — COMPREHENSIVE METABOLIC PANEL (CC13)
ALT: 22 U/L (ref 0–55)
AST: 17 U/L (ref 5–34)
Albumin: 3.5 g/dL (ref 3.5–5.0)
Alkaline Phosphatase: 104 U/L (ref 40–150)
BUN: 31 mg/dL — ABNORMAL HIGH (ref 7.0–26.0)
Calcium: 9.2 mg/dL (ref 8.4–10.4)
Chloride: 107 mEq/L (ref 98–107)
Potassium: 4.2 mEq/L (ref 3.5–5.1)
Sodium: 139 mEq/L (ref 136–145)
Total Protein: 6.4 g/dL (ref 6.4–8.3)

## 2012-08-16 LAB — CBC WITH DIFFERENTIAL/PLATELET
BASO%: 1.6 % (ref 0.0–2.0)
EOS%: 3.6 % (ref 0.0–7.0)
HCT: 30 % — ABNORMAL LOW (ref 38.4–49.9)
MCH: 30.9 pg (ref 27.2–33.4)
MCHC: 35.7 g/dL (ref 32.0–36.0)
MONO#: 0.3 10*3/uL (ref 0.1–0.9)
NEUT%: 69.9 % (ref 39.0–75.0)
RBC: 3.46 10*6/uL — ABNORMAL LOW (ref 4.20–5.82)
RDW: 14.5 % (ref 11.0–14.6)
WBC: 4.5 10*3/uL (ref 4.0–10.3)
lymph#: 0.9 10*3/uL (ref 0.9–3.3)
nRBC: 0 % (ref 0–0)

## 2012-08-16 MED ORDER — ONDANSETRON 8 MG/50ML IVPB (CHCC)
8.0000 mg | Freq: Once | INTRAVENOUS | Status: AC
  Administered 2012-08-16: 8 mg via INTRAVENOUS

## 2012-08-16 MED ORDER — DEXAMETHASONE SODIUM PHOSPHATE 10 MG/ML IJ SOLN
10.0000 mg | Freq: Once | INTRAMUSCULAR | Status: AC
  Administered 2012-08-16: 10 mg via INTRAVENOUS

## 2012-08-16 MED ORDER — HEPARIN SOD (PORK) LOCK FLUSH 100 UNIT/ML IV SOLN
500.0000 [IU] | Freq: Once | INTRAVENOUS | Status: AC | PRN
  Administered 2012-08-16: 500 [IU]
  Filled 2012-08-16: qty 5

## 2012-08-16 MED ORDER — SODIUM CHLORIDE 0.9 % IV SOLN
Freq: Once | INTRAVENOUS | Status: AC
  Administered 2012-08-16: 09:00:00 via INTRAVENOUS

## 2012-08-16 MED ORDER — PACLITAXEL PROTEIN-BOUND CHEMO INJECTION 100 MG
90.0000 mg/m2 | Freq: Once | INTRAVENOUS | Status: AC
  Administered 2012-08-16: 175 mg via INTRAVENOUS
  Filled 2012-08-16: qty 35

## 2012-08-16 MED ORDER — SODIUM CHLORIDE 0.9 % IJ SOLN
10.0000 mL | INTRAMUSCULAR | Status: DC | PRN
  Administered 2012-08-16: 10 mL
  Filled 2012-08-16: qty 10

## 2012-08-16 NOTE — Patient Instructions (Signed)
[facility] Discharge Instructions for Patients Receiving Chemotherapy  Today you received the following chemotherapy agents Abraxane To help prevent nausea and vomiting after your treatment, we encourage you to take your nausea medication as prescribed. If you develop nausea and vomiting that is not controlled by your nausea medication, call the clinic. If it is after clinic hours your family physician or the after hours number for the clinic or go to the Emergency Department.   BELOW ARE SYMPTOMS THAT SHOULD BE REPORTED IMMEDIATELY:  *FEVER GREATER THAN 100.5 F  *CHILLS WITH OR WITHOUT FEVER  NAUSEA AND VOMITING THAT IS NOT CONTROLLED WITH YOUR NAUSEA MEDICATION  *UNUSUAL SHORTNESS OF BREATH  *UNUSUAL BRUISING OR BLEEDING  TENDERNESS IN MOUTH AND THROAT WITH OR WITHOUT PRESENCE OF ULCERS  *URINARY PROBLEMS  *BOWEL PROBLEMS  UNUSUAL RASH Items with * indicate a potential emergency and should be followed up as soon as possible.  One of the nurses will contact you 24 hours after your treatment. Please let the nurse know about any problems that you may have experienced. Feel free to call the clinic you have any questions or concerns. The clinic phone number is [phone].   I have been informed and understand all the instructions given to me. I know to contact the clinic, my physician, or go to the Emergency Department if any problems should occur. I do not have any questions at this time, but understand that I may call the clinic during office hours   should I have any questions or need assistance in obtaining follow up care.    __________________________________________  _____________  __________ Signature of Patient or Authorized Representative            Date                   Time    __________________________________________ Nurse's Signature    

## 2012-08-17 ENCOUNTER — Ambulatory Visit (INDEPENDENT_AMBULATORY_CARE_PROVIDER_SITE_OTHER): Payer: BC Managed Care – PPO | Admitting: Surgery

## 2012-08-17 ENCOUNTER — Encounter (INDEPENDENT_AMBULATORY_CARE_PROVIDER_SITE_OTHER): Payer: Self-pay | Admitting: Surgery

## 2012-08-17 VITALS — BP 101/62 | HR 89 | Temp 97.8°F | Resp 18 | Ht 70.0 in | Wt 162.2 lb

## 2012-08-17 DIAGNOSIS — Z09 Encounter for follow-up examination after completed treatment for conditions other than malignant neoplasm: Secondary | ICD-10-CM

## 2012-08-17 NOTE — Progress Notes (Signed)
Subjective:     Patient ID: Derrick Crosby, male   DOB: Mar 23, 1951, 61 y.o.   MRN: [id number]  HPI He is here for a postop visit status post incision and drainage of infected sebaceous cyst on his back. Again, he is undergoing treatment for lung cancer. He has finished his antibiotics. He has no complaints. Review of Systems     Objective:   Physical Exam On exam, the incision is well healed with a small eschar.    Assessment:     Patient status post incision and drainage of infected sebaceous cyst on his back    Plan:     I will see him back as needed

## 2012-08-21 ENCOUNTER — Telehealth: Payer: Self-pay | Admitting: *Deleted

## 2012-08-21 DIAGNOSIS — C3400 Malignant neoplasm of unspecified main bronchus: Secondary | ICD-10-CM

## 2012-08-21 MED ORDER — HYDROCODONE-HOMATROPINE 5-1.5 MG/5ML PO SYRP: 5.0000 mL | ORAL_SOLUTION | Freq: Four times a day (QID) | ORAL | Status: DC | PRN

## 2012-08-21 NOTE — Telephone Encounter (Signed)
Pt's wife called stating that pt is having increased coughing at night that is productive and is keeping him awake at night.  She has tried an OTC cough suppressant with no relief.  Per Dr Donnald Garre, okay to give hycodan cough syrup.  Pt's wife verbalized understanding.  SLJ

## 2012-08-23 ENCOUNTER — Telehealth: Payer: Self-pay | Admitting: Medical Oncology

## 2012-08-23 ENCOUNTER — Other Ambulatory Visit (HOSPITAL_BASED_OUTPATIENT_CLINIC_OR_DEPARTMENT_OTHER): Payer: BC Managed Care – PPO | Admitting: Lab

## 2012-08-23 ENCOUNTER — Ambulatory Visit (HOSPITAL_BASED_OUTPATIENT_CLINIC_OR_DEPARTMENT_OTHER): Payer: BC Managed Care – PPO

## 2012-08-23 VITALS — BP 106/68 | HR 70 | Temp 98.0°F | Resp 20

## 2012-08-23 DIAGNOSIS — Z23 Encounter for immunization: Secondary | ICD-10-CM

## 2012-08-23 DIAGNOSIS — C3400 Malignant neoplasm of unspecified main bronchus: Secondary | ICD-10-CM

## 2012-08-23 DIAGNOSIS — C3490 Malignant neoplasm of unspecified part of unspecified bronchus or lung: Secondary | ICD-10-CM

## 2012-08-23 DIAGNOSIS — Z5111 Encounter for antineoplastic chemotherapy: Secondary | ICD-10-CM

## 2012-08-23 LAB — COMPREHENSIVE METABOLIC PANEL (CC13)
CO2: 26 mEq/L (ref 22–29)
Calcium: 9.2 mg/dL (ref 8.4–10.4)
Creatinine: 1.2 mg/dL (ref 0.7–1.3)
Glucose: 99 mg/dl (ref 70–99)
Total Bilirubin: 0.7 mg/dL (ref 0.20–1.20)
Total Protein: 6.4 g/dL (ref 6.4–8.3)

## 2012-08-23 LAB — CBC WITH DIFFERENTIAL/PLATELET
Basophils Absolute: 0.1 10*3/uL (ref 0.0–0.1)
Eosinophils Absolute: 0.1 10*3/uL (ref 0.0–0.5)
HCT: 30.1 % — ABNORMAL LOW (ref 38.4–49.9)
HGB: 10.6 g/dL — ABNORMAL LOW (ref 13.0–17.1)
LYMPH%: 19.6 % (ref 14.0–49.0)
MCV: 87.8 fL (ref 79.3–98.0)
MONO%: 14.9 % — ABNORMAL HIGH (ref 0.0–14.0)
NEUT#: 1.7 10*3/uL (ref 1.5–6.5)
Platelets: 143 10*3/uL (ref 140–400)

## 2012-08-23 MED ORDER — ONDANSETRON 8 MG/50ML IVPB (CHCC)
8.0000 mg | Freq: Once | INTRAVENOUS | Status: AC
  Administered 2012-08-23: 8 mg via INTRAVENOUS

## 2012-08-23 MED ORDER — DEXAMETHASONE SODIUM PHOSPHATE 10 MG/ML IJ SOLN
10.0000 mg | Freq: Once | INTRAMUSCULAR | Status: AC
  Administered 2012-08-23: 10 mg via INTRAVENOUS

## 2012-08-23 MED ORDER — SODIUM CHLORIDE 0.9 % IJ SOLN
10.0000 mL | INTRAMUSCULAR | Status: DC | PRN
  Administered 2012-08-23: 10 mL
  Filled 2012-08-23: qty 10

## 2012-08-23 MED ORDER — SODIUM CHLORIDE 0.9 % IV SOLN
Freq: Once | INTRAVENOUS | Status: AC
  Administered 2012-08-23: 10:00:00 via INTRAVENOUS

## 2012-08-23 MED ORDER — HEPARIN SOD (PORK) LOCK FLUSH 100 UNIT/ML IV SOLN
500.0000 [IU] | Freq: Once | INTRAVENOUS | Status: AC | PRN
  Administered 2012-08-23: 500 [IU]
  Filled 2012-08-23: qty 5

## 2012-08-23 MED ORDER — PACLITAXEL PROTEIN-BOUND CHEMO INJECTION 100 MG
90.0000 mg/m2 | Freq: Once | INTRAVENOUS | Status: AC
  Administered 2012-08-23: 175 mg via INTRAVENOUS
  Filled 2012-08-23: qty 35

## 2012-08-23 MED ORDER — INFLUENZA VIRUS VACC SPLIT PF IM SUSP
0.5000 mL | Freq: Once | INTRAMUSCULAR | Status: AC
  Administered 2012-08-23: 0.5 mL via INTRAMUSCULAR
  Filled 2012-08-23: qty 0.5

## 2012-08-23 NOTE — Telephone Encounter (Signed)
I reviewed how many drugs pt gets with each treatment.

## 2012-08-23 NOTE — Progress Notes (Signed)
Okay to treat per Dr. Donnald Garre with WBC 2.8 and ANc 1.7 today.  Pt. Requested for flu vaccine. Okay to receive flu vaccine today per md.

## 2012-08-23 NOTE — Patient Instructions (Addendum)
[facility] Discharge Instructions for Patients Receiving Chemotherapy  Today you received the following chemotherapy agents Abraxane/ also flu vaccine.  To help prevent nausea and vomiting after your treatment, we encourage you to take your nausea medication as prescribed.   If you develop nausea and vomiting that is not controlled by your nausea medication, call the clinic. If it is after clinic hours your family physician or the after hours number for the clinic or go to the Emergency Department.   BELOW ARE SYMPTOMS THAT SHOULD BE REPORTED IMMEDIATELY:  *FEVER GREATER THAN 100.5 F  *CHILLS WITH OR WITHOUT FEVER  NAUSEA AND VOMITING THAT IS NOT CONTROLLED WITH YOUR NAUSEA MEDICATION  *UNUSUAL SHORTNESS OF BREATH  *UNUSUAL BRUISING OR BLEEDING  TENDERNESS IN MOUTH AND THROAT WITH OR WITHOUT PRESENCE OF ULCERS  *URINARY PROBLEMS  *BOWEL PROBLEMS  UNUSUAL RASH Items with * indicate a potential emergency and should be followed up as soon as possible.  Feel free to call the clinic you have any questions or concerns. The clinic phone number is [phone].   Inactivated Influenza Vaccine What You Need to Know WHY GET VACCINATED?  Influenza ("flu") is a contagious disease.  It is caused by the influenza virus, which can be spread by coughing, sneezing, or nasal secretions.  Anyone can get influenza, but rates of infection are highest among children. For most people, symptoms last only a few days. They include:  Fever or chills.  Sore throat.  Muscle aches.  Fatigue.  Cough.  Headache.  Runny or stuffy nose. Other illnesses can have the same symptoms and are often mistaken for influenza. Young children, people 70 and older, pregnant women, and people with certain health conditions, such as heart, lung or kidney disease, or a weakened immune system can get much sicker. Flu can cause high fever and pneumonia, and make existing medical conditions  worse. It can cause diarrhea and seizures in children. Each year thousands of people die from influenza and even more require hospitalization. By getting flu vaccine, you can protect yourself from influenza and may also avoid spreading influenza to others. INACTIVATED INFLUENZA VACCINE There are 2 types of influenza vaccine:  Inactivated (killed) vaccine, the "flu shot", is given by injection with a needle.  Live, attenuated (weakened) influenza vaccine is sprayed into the nostrils. This vaccine is described in a separate Vaccine Information Statement. A "high-dose" inactivated influenza vaccine is available for people 52 years of age and older. Ask your doctor for more information.  Influenza viruses are always changing, so annual vaccination is recommended. Each year scientists try to match the viruses in the vaccine to those most likely to cause flu that year. Flu vaccine will not prevent disease from other viruses, including flu viruses not contained in the vaccine. It takes up to 2 weeks for protection to develop after the shot. Protection lasts about 1 year. Some inactivated influenza vaccine contains a preservative called thimerosal. Thimerosal-free influenza vaccine is available. Ask your doctor for more information. WHO SHOULD GET INACTIVATED INFLUENZA VACCINE AND WHEN? WHO  All people 71 months of age and older should get flu vaccine.  Vaccination is especially important for people at higher risk of severe influenza and their close contacts, including healthcare personnel and close contacts of children younger than 6 months. WHEN Getting the vaccine as soon as it is available. This should provide protection if the flu season comes early. You can get the vaccine as long as illness is occurring in your  community. Influenza can occur at any time, but most influenza occurs from October through May. In recent seasons, most infections have occurred in January and February. Getting vaccinated  in December, or even later, will still be beneficial in most years. Adults and older children need 1 dose of influenza vaccine each year. But some children younger than 39 years of age need 2 doses to be protected. Ask your doctor. Influenza vaccine may be given at the same time as other vaccines, including pneumococcal vaccine. SOME PEOPLE SHOULD NOT GET INACTIVATED INFLUENZA VACCINE OR SHOULD WAIT  Tell your doctor if you have any severe (life-threatening) allergies, including a severe allergy to eggs. A severe allergy to any vaccine component may be a reason not to get the vaccine. Allergic reactions to influenza vaccine are rare.  Tell your doctor if you ever had a severe reaction after a dose of influenza vaccine.  Tell your doctor if you ever had Guillain-Barr syndrome (a severe paralytic illness, also called GBS). Your doctor will help you decide whether the vaccine is recommended for you.  People who are moderately or severely ill should usually wait until they recover before getting the flu vaccine. If you are ill, talk to your doctor about whether to reschedule the vaccination. People with a mild illness can usually get the vaccine. WHAT ARE THE RISKS FROM INACTIVATED INFLUENZA VACCINE? A vaccine, like any medicine, could possibly cause serious problems, such as severe allergic reactions. The risk of a vaccine causing serious harm, or death, is extremely small. Serious problems from inactivated influenza vaccine are very rare. The viruses in inactivated influenza vaccine have been killed, so you cannot get influenza from the vaccine. Mild problems:  Soreness, redness, or swelling where the shot was given.  Hoarseness; sore, red or itchy eyes; cough.  Fever.  Aches.  Headache.  Itching.  Fatigue. If these problems occur, they usually begin soon after the shot and last 1 to 2 days. Moderate problems: Young children who get inactivated flu vaccine and pneumococcal vaccine  (PCV13) at the same time appear to be at increased risk for seizures caused by fever. Ask your doctor for more information. Tell your doctor if a child who is getting flu vaccine has ever had a seizure. Severe problems:  Life-threatening allergic reactions from vaccines are very rare. If they do occur, it is usually within a few minutes to a few hours after the shot.  In 1976, a type of inactivated influenza (swine flu) vaccine was associated with Guillan-Barr syndrome (GBS). Since then, flu vaccines have not been clearly linked to GBS. However, if there is a risk of GBS from current flu vaccines, it would be no more than 1 or 2 cases per million people vaccinated. This is much lower than the risk of severe influenza, which can be prevented by vaccination. The safety of vaccines is always being monitored. For more information, visit:  PrintingMaps.se and  https://www.farmer-stevens.info/ One brand of inactivated flu vaccine, called Afluria, should not be given to children 58 years of age or younger, except in special circumstances. A related vaccine was associated with fevers and fever-related seizures in young children in [place]. Your doctor can give you more information. WHAT IF THERE IS A SEVERE REACTION? What should I look for? Any unusual condition, such as a high fever or unusual behavior. Signs of a serious allergic reaction can include difficulty breathing, hoarseness or wheezing, hives, paleness, weakness, a fast heartbeat, or dizziness. What should I do?  Call a  doctor, or get the person to a doctor right away.  Tell your doctor what happened, the date and time it happened, and when the vaccination was given.  Ask your doctor, nurse, or health department to report the reaction by filing a Vaccine Adverse Event Reporting System (VAERS) form. Or, you can file this report through the VAERS website at www.vaers.LAgents.no  or by calling 1-(304)074-1338. VAERS does not provide medical advice. THE NATIONAL VACCINE INJURY COMPENSATION PROGRAM The National Vaccine Injury Compensation Program (VICP) was created in 1986. People who believe they may have been injured by a vaccine can learn about the program and about filing a claim by calling 1-249-355-0712, or visiting the VICP website at SpiritualWord.at HOW CAN I LEARN MORE?  Ask your doctor. They can give you the vaccine package insert or suggest other sources of information.  Call your local or state health department.  Contact the Centers for Disease Control and Prevention (CDC):  Call (425) 384-3547 (1-800-CDC-INFO) or  Visit the CDC's website at BiotechRoom.com.cy CDC Inactivated Influenza Vaccine VIS (05/10/11) Document Released: 08/19/2006 Document Revised: 04/25/2012 Document Reviewed: 05/10/2011 [facility] Patient Information 2013 [place], [place].

## 2012-08-24 ENCOUNTER — Other Ambulatory Visit: Payer: Self-pay

## 2012-08-24 ENCOUNTER — Telehealth: Payer: Self-pay | Admitting: Medical Oncology

## 2012-08-24 ENCOUNTER — Encounter (HOSPITAL_COMMUNITY): Payer: Self-pay | Admitting: *Deleted

## 2012-08-24 ENCOUNTER — Emergency Department (HOSPITAL_COMMUNITY): Payer: BC Managed Care – PPO

## 2012-08-24 ENCOUNTER — Emergency Department (HOSPITAL_COMMUNITY)
Admission: EM | Admit: 2012-08-24 | Discharge: 2012-08-24 | Disposition: A | Discharge status: Home / Self Care | Payer: BC Managed Care – PPO | Attending: Emergency Medicine | Admitting: Emergency Medicine

## 2012-08-24 DIAGNOSIS — Z87891 Personal history of nicotine dependence: Secondary | ICD-10-CM | POA: Insufficient documentation

## 2012-08-24 DIAGNOSIS — R079 Chest pain, unspecified: Secondary | ICD-10-CM | POA: Insufficient documentation

## 2012-08-24 DIAGNOSIS — Z79899 Other long term (current) drug therapy: Secondary | ICD-10-CM | POA: Insufficient documentation

## 2012-08-24 DIAGNOSIS — I10 Essential (primary) hypertension: Secondary | ICD-10-CM | POA: Insufficient documentation

## 2012-08-24 DIAGNOSIS — R1110 Vomiting, unspecified: Secondary | ICD-10-CM

## 2012-08-24 DIAGNOSIS — J4489 Other specified chronic obstructive pulmonary disease: Secondary | ICD-10-CM | POA: Insufficient documentation

## 2012-08-24 DIAGNOSIS — C3490 Malignant neoplasm of unspecified part of unspecified bronchus or lung: Secondary | ICD-10-CM | POA: Insufficient documentation

## 2012-08-24 DIAGNOSIS — Z7982 Long term (current) use of aspirin: Secondary | ICD-10-CM | POA: Insufficient documentation

## 2012-08-24 DIAGNOSIS — T451X5A Adverse effect of antineoplastic and immunosuppressive drugs, initial encounter: Secondary | ICD-10-CM | POA: Insufficient documentation

## 2012-08-24 DIAGNOSIS — J449 Chronic obstructive pulmonary disease, unspecified: Secondary | ICD-10-CM | POA: Insufficient documentation

## 2012-08-24 LAB — CBC WITH DIFFERENTIAL/PLATELET
Basophils Absolute: 0 10*3/uL (ref 0.0–0.1)
Eosinophils Absolute: 0 10*3/uL (ref 0.0–0.7)
Eosinophils Relative: 0 % (ref 0–5)
Lymphs Abs: 0.4 10*3/uL — ABNORMAL LOW (ref 0.7–4.0)
MCH: 31.1 pg (ref 26.0–34.0)
Neutrophils Relative %: 78 % — ABNORMAL HIGH (ref 43–77)
Platelets: 147 10*3/uL — ABNORMAL LOW (ref 150–400)
RBC: 3.18 MIL/uL — ABNORMAL LOW (ref 4.22–5.81)
RDW: 15 % (ref 11.5–15.5)
WBC: 3.5 10*3/uL — ABNORMAL LOW (ref 4.0–10.5)

## 2012-08-24 LAB — COMPREHENSIVE METABOLIC PANEL
ALT: 41 U/L (ref 0–53)
AST: 25 U/L (ref 0–37)
Albumin: 3.9 g/dL (ref 3.5–5.2)
Alkaline Phosphatase: 95 U/L (ref 39–117)
Calcium: 9.2 mg/dL (ref 8.4–10.5)
GFR calc Af Amer: 85 mL/min — ABNORMAL LOW (ref >90)
Glucose, Bld: 102 mg/dL — ABNORMAL HIGH (ref 70–99)
Potassium: 3.7 mEq/L (ref 3.5–5.1)
Sodium: 137 mEq/L (ref 135–145)
Total Protein: 6.9 g/dL (ref 6.0–8.3)

## 2012-08-24 LAB — URINALYSIS, ROUTINE W REFLEX MICROSCOPIC
Bilirubin Urine: NEGATIVE
Glucose, UA: NEGATIVE mg/dL
Hgb urine dipstick: NEGATIVE
Specific Gravity, Urine: 1.018 (ref 1.005–1.030)
Urobilinogen, UA: 0.2 mg/dL (ref 0.0–1.0)
pH: 6.5 (ref 5.0–8.0)

## 2012-08-24 LAB — POCT I-STAT TROPONIN I: Troponin i, poc: 0 ng/mL (ref 0.00–0.08)

## 2012-08-24 MED ORDER — SODIUM CHLORIDE 0.9 % IV SOLN
INTRAVENOUS | Status: DC
  Administered 2012-08-24: 18:00:00 via INTRAVENOUS

## 2012-08-24 MED ORDER — PANTOPRAZOLE SODIUM 40 MG IV SOLR
40.0000 mg | Freq: Once | INTRAVENOUS | Status: AC
  Administered 2012-08-24: 40 mg via INTRAVENOUS
  Filled 2012-08-24: qty 40

## 2012-08-24 MED ORDER — SODIUM CHLORIDE 0.9 % IV BOLUS (SEPSIS)
1000.0000 mL | Freq: Once | INTRAVENOUS | Status: AC
  Administered 2012-08-24: 1000 mL via INTRAVENOUS

## 2012-08-24 MED ORDER — SODIUM CHLORIDE 0.9 % IV BOLUS (SEPSIS)
500.0000 mL | Freq: Once | INTRAVENOUS | Status: DC
Start: 2012-08-24 — End: 2012-08-24

## 2012-08-24 MED ORDER — METOCLOPRAMIDE HCL 5 MG/ML IJ SOLN
10.0000 mg | Freq: Once | INTRAMUSCULAR | Status: AC
  Administered 2012-08-24: 10 mg via INTRAVENOUS
  Filled 2012-08-24: qty 2

## 2012-08-24 NOTE — ED Notes (Addendum)
Pt sts he is a cancer patient- he received chemo treatment yesterday and has been vomiting since early this morning. Pt sts his chest has been hurting as well after he vomited. Pt sts emesis after chemotherapy is normal for him. Pt sts he was vomiting when the chest pain began. Sts he has not taken any aspirin or nitro today.Pt sts pain when he lays down and upon palpation.

## 2012-08-24 NOTE — Telephone Encounter (Signed)
Returned wife's call. She reports pt was up all night with dry heaves and vomited his antiemetics at 0400 today . He also reported " chest pain up in neck area".I instructed [name] to take pt to [facility] to be evaluated

## 2012-08-24 NOTE — ED Provider Notes (Signed)
History     CSN: [id number]  Arrival date & time 08/24/12  1519   First MD Initiated Contact with Patient 08/24/12 1614      Chief Complaint  Patient presents with  . Chest Pain  . Emesis    (Consider location/radiation/quality/duration/timing/severity/associated sxs/prior treatment) HPI This 61 year old male has metastatic lung cancer and undergoing palliative chemotherapy, he had chemotherapy yesterday and has had problems with postchemotherapy nausea. He has been unable to hold down his anti-emetics after chemotherapy yesterday and started vomiting last night. He's been vomiting for about 18 hours or more intermittently and whenever he vomits he has burning chest pain for his entire substernal area to his throat with a sour to the back of his throat. The chest pains last for several minutes at a time while he is vomiting in several minutes afterwards. He does not have any exertional chest pain he is no shortness of breath no fever no confusion no rashes he does have a chronic cough which is unchanged. He is no abdominal pain. He is no bloody vomiting or bloody stools. There is no treatment prior to arrival because he was unable to hold down his nausea medicine. His burning pain when he has it is moderately severe. He is not having his chest pain currently. Past Medical History  Diagnosis Date  . Hypertension   . Hyperglyceridemia, pure   . COPD (chronic obstructive pulmonary disease)   . Cancer     Skin  Cancer - Left ear.-  . Hemoptysis   . Skin cancer     left ear  . Anxiety   . Depression   . Lung cancer 06/13/12    lung mass /right hilar suspicious for primary bronchogenic ca  . Hemoptysis   . Seasonal allergies   . History of radiation therapy 07/05/12-07/26/12    lung ca     Past Surgical History  Procedure Date  . Tympanoplasty   . Video bronchoscopy 06/13/12    with endobronchial u/s/ with biospy left hilar mass/Dr. Lonia Farber  . Tonsillectomy     child     Family History  Problem Relation Age of Onset  . Lung cancer Mother   . Cancer Mother     lung former smoker, age 22 now    History  Substance Use Topics  . Smoking status: Former Smoker -- 0.5 packs/day for 50 years    Types: Cigarettes    Quit date: 06/14/2012  . Smokeless tobacco: Never Used   Comment: pt trying to quit  . Alcohol Use: Yes     occasionally beer       Review of Systems 10 Systems reviewed and are negative for acute change except as noted in the HPI. Allergies  Review of patient's allergies indicates no known allergies.  Home Medications   Current Outpatient Rx  Name Route Sig Dispense Refill  . ACETAMINOPHEN 325 MG PO TABS Oral Take 325-650 mg by mouth every 6 (six) hours as needed. For general aches    . ALBUTEROL SULFATE HFA 108 (90 BASE) MCG/ACT IN AERS Inhalation Inhale 2 puffs into the lungs every 6 (six) hours as needed. wheezing    . ASPIRIN 81 MG PO CHEW Oral Chew 81 mg by mouth daily.    Marland Kitchen CITALOPRAM HYDROBROMIDE 20 MG PO TABS Oral Take 20 mg by mouth every evening.     Marland Kitchen CLONAZEPAM 0.5 MG PO TABS Oral Take 0.5 mg by mouth 2 (two) times daily as needed. For anxiety    .  DOXYCYCLINE HYCLATE 100 MG PO TBEC Oral Take 100 mg by mouth 2 (two) times daily.    . OMEGA-3 FATTY ACIDS 1000 MG PO CAPS Oral Take 1 g by mouth daily.     Marland Kitchen HYDROCODONE-ACETAMINOPHEN 5-325 MG PO TABS Oral Take 1 tablet by mouth every 4 (four) hours as needed.    Marland Kitchen HYDROCODONE-HOMATROPINE 5-1.5 MG/5ML PO SYRP Oral Take 5 mLs by mouth every 6 (six) hours as needed for cough. 240 mL 0  . LIDOCAINE-PRILOCAINE 2.5-2.5 % EX CREA Topical Apply topically as needed. Apply to [facility] A cath site 30-60 min before chemotherapy 30 g 0  . LORATADINE 10 MG PO TABS Oral Take 10 mg by mouth daily.    . MULTI-VITAMIN/MINERALS PO TABS Oral Take 1 tablet by mouth daily.    Marland Kitchen ONDANSETRON HCL 8 MG PO TABS Oral Take 1 tablet (8 mg total) by mouth every 8 (eight) hours as needed for nausea. 20 tablet  0  . PACLITAXEL PROTEIN-BOUND CHEMO INJECTION 100 MG Intravenous Inject 175 mg into the vein once.     Marland Kitchen PROCHLORPERAZINE MALEATE 10 MG PO TABS  TAKE 1 TABLET (10 MG TOTAL) BY MOUTH EVERY 6 (SIX) HOURS AS NEEDED. 60 tablet 3  . SUCRALFATE 1 G PO TABS Oral Take 1 tablet (1 g total) by mouth 4 (four) times daily. Dissolve in 15 cc water. 120 tablet 2  . TRIAMCINOLONE ACETONIDE 0.1 % EX CREA Topical Apply topically 2 (two) times daily.      BP 134/70  Pulse 73  Temp 98.5 F (36.9 C) (Oral)  Resp 21  Ht 5\' 4"  (1.626 m)  Wt 160 lb (72.576 kg)  BMI 27.46 kg/m2  SpO2 97%  Physical Exam  Nursing note and vitals reviewed. Constitutional:       Awake, alert, nontoxic appearance.  HENT:  Head: Atraumatic.  Eyes: Right eye exhibits no discharge. Left eye exhibits no discharge.  Neck: Neck supple.  Cardiovascular: Normal rate and regular rhythm.   No murmur heard. Pulmonary/Chest: Effort normal and breath sounds normal. No respiratory distress. He has no wheezes. He has no rales. He exhibits tenderness.       Reproducible anterior chest wall tenderness over the sternal region  Abdominal: Soft. Bowel sounds are normal. He exhibits no distension and no mass. There is no tenderness. There is no rebound and no guarding.  Musculoskeletal: He exhibits no edema and no tenderness.       Baseline ROM, no obvious new focal weakness.  Neurological: He is alert.       Mental status and motor strength appears baseline for patient and situation.  Skin: No rash noted.  Psychiatric: He has a normal mood and affect.    ED Course  Procedures (including critical care time) ECG: Normal sinus rhythm, ventricular rate 74, normal axis, normal intervals, no acute ischemic changes noted, no significant change noted compared with 06/13/2012 Labs Reviewed  CBC WITH DIFFERENTIAL - Abnormal; Notable for the following:    WBC 3.5 (*)     RBC 3.18 (*)     Hemoglobin 9.9 (*)     HCT 27.9 (*)     Platelets 147 (*)      Neutrophils Relative 78 (*)     Lymphs Abs 0.4 (*)     All other components within normal limits  COMPREHENSIVE METABOLIC PANEL - Abnormal; Notable for the following:    Glucose, Bld 102 (*)     BUN 24 (*)  GFR calc non Af Amer 74 (*)     GFR calc Af Amer 85 (*)     All other components within normal limits  LIPASE, BLOOD  URINALYSIS, ROUTINE W REFLEX MICROSCOPIC  POCT I-STAT TROPONIN I  LAB REPORT - SCANNED   No results found.   1. Vomiting due to chemotherapy       MDM   Pt stable in ED with no significant deterioration in condition.Patient / Family / Caregiver informed of clinical course, understand medical decision-making process, and agree with plan.        Hurman Horn, MD 08/26/12 [phone]

## 2012-08-25 ENCOUNTER — Encounter: Payer: Self-pay | Admitting: Radiation Oncology

## 2012-08-30 ENCOUNTER — Ambulatory Visit (HOSPITAL_BASED_OUTPATIENT_CLINIC_OR_DEPARTMENT_OTHER): Payer: BC Managed Care – PPO

## 2012-08-30 ENCOUNTER — Ambulatory Visit (HOSPITAL_BASED_OUTPATIENT_CLINIC_OR_DEPARTMENT_OTHER): Payer: BC Managed Care – PPO | Admitting: Physician Assistant

## 2012-08-30 ENCOUNTER — Telehealth: Payer: Self-pay | Admitting: Internal Medicine

## 2012-08-30 ENCOUNTER — Encounter: Payer: Self-pay | Admitting: Radiation Oncology

## 2012-08-30 ENCOUNTER — Ambulatory Visit
Admission: RE | Admit: 2012-08-30 | Discharge: 2012-08-30 | Disposition: A | Discharge status: Home / Self Care | Payer: BC Managed Care – PPO | Source: Ambulatory Visit | Attending: Radiation Oncology | Admitting: Radiation Oncology

## 2012-08-30 ENCOUNTER — Other Ambulatory Visit (HOSPITAL_BASED_OUTPATIENT_CLINIC_OR_DEPARTMENT_OTHER): Payer: BC Managed Care – PPO | Admitting: Lab

## 2012-08-30 VITALS — BP 130/75 | HR 69 | Temp 97.7°F | Resp 20 | Wt 161.0 lb

## 2012-08-30 VITALS — BP 108/66 | HR 64 | Temp 97.7°F | Resp 20 | Ht 64.0 in | Wt 161.3 lb

## 2012-08-30 DIAGNOSIS — C3490 Malignant neoplasm of unspecified part of unspecified bronchus or lung: Secondary | ICD-10-CM

## 2012-08-30 DIAGNOSIS — Z5111 Encounter for antineoplastic chemotherapy: Secondary | ICD-10-CM

## 2012-08-30 DIAGNOSIS — C3410 Malignant neoplasm of upper lobe, unspecified bronchus or lung: Secondary | ICD-10-CM

## 2012-08-30 DIAGNOSIS — C3400 Malignant neoplasm of unspecified main bronchus: Secondary | ICD-10-CM

## 2012-08-30 DIAGNOSIS — R112 Nausea with vomiting, unspecified: Secondary | ICD-10-CM

## 2012-08-30 LAB — COMPREHENSIVE METABOLIC PANEL (CC13)
ALT: 26 U/L (ref 0–55)
AST: 21 U/L (ref 5–34)
Albumin: 3.7 g/dL (ref 3.5–5.0)
BUN: 24 mg/dL (ref 7.0–26.0)
CO2: 22 mEq/L (ref 22–29)
Calcium: 9.4 mg/dL (ref 8.4–10.4)
Chloride: 105 mEq/L (ref 98–107)
Creatinine: 1.1 mg/dL (ref 0.7–1.3)
Potassium: 4.3 mEq/L (ref 3.5–5.1)

## 2012-08-30 LAB — CBC WITH DIFFERENTIAL/PLATELET
Basophils Absolute: 0 10*3/uL (ref 0.0–0.1)
EOS%: 1.3 % (ref 0.0–7.0)
HCT: 25.9 % — ABNORMAL LOW (ref 38.4–49.9)
HGB: 9.4 g/dL — ABNORMAL LOW (ref 13.0–17.1)
MCH: 31.5 pg (ref 27.2–33.4)
MONO#: 0.4 10*3/uL (ref 0.1–0.9)
NEUT#: 1.9 10*3/uL (ref 1.5–6.5)
NEUT%: 51.6 % (ref 39.0–75.0)
RDW: 15.6 % — ABNORMAL HIGH (ref 11.0–14.6)
WBC: 3.7 10*3/uL — ABNORMAL LOW (ref 4.0–10.3)
lymph#: 1.4 10*3/uL (ref 0.9–3.3)

## 2012-08-30 MED ORDER — SODIUM CHLORIDE 0.9 % IV SOLN
Freq: Once | INTRAVENOUS | Status: AC
  Administered 2012-08-30: 14:00:00 via INTRAVENOUS

## 2012-08-30 MED ORDER — SODIUM CHLORIDE 0.9 % IV SOLN
493.0000 mg | Freq: Once | INTRAVENOUS | Status: AC
  Administered 2012-08-30: 490 mg via INTRAVENOUS
  Filled 2012-08-30: qty 49

## 2012-08-30 MED ORDER — ONDANSETRON 16 MG/50ML IVPB (CHCC)
16.0000 mg | Freq: Once | INTRAVENOUS | Status: AC
  Administered 2012-08-30: 16 mg via INTRAVENOUS

## 2012-08-30 MED ORDER — PACLITAXEL PROTEIN-BOUND CHEMO INJECTION 100 MG
90.0000 mg/m2 | Freq: Once | INTRAVENOUS | Status: AC
  Administered 2012-08-30: 175 mg via INTRAVENOUS
  Filled 2012-08-30: qty 35

## 2012-08-30 MED ORDER — DEXAMETHASONE SODIUM PHOSPHATE 4 MG/ML IJ SOLN
20.0000 mg | Freq: Once | INTRAMUSCULAR | Status: AC
  Administered 2012-08-30: 20 mg via INTRAVENOUS

## 2012-08-30 NOTE — Progress Notes (Signed)
Radiation Oncology         [phone] ________________________________  Name: Derrick Crosby MRN: [id number]  Date: 08/30/2012  DOB: [birth year]  Follow-Up Visit Note  CC: [name]  [name], PA  Diagnosis:   Metastatic non-small cell lung cancer, squamous cell carcinoma  Interval Since Last Radiation:  One month   Narrative:  The patient returns today for routine follow-up.  The patient returns to clinic today after having completed a course of palliative radiotherapy to the left hilar mass on 07/26/2012. The patient is continuing on chemotherapy at this time. He is status post 2 cycles of carboplatin and Abraxane. The patient indicates that his treatment has been going relatively well. He feels that he is breathing well at this time with no major complaints of worsening shortness of breath or chest discomfort. He denies any pain with swallowing although his food he feels sticks on occasion. No ongoing esophagitis. No distant pain at this time.                              ALLERGIES:   has no known allergies.  Meds: Current Outpatient Prescriptions  Medication Sig Dispense Refill  . acetaminophen (TYLENOL) 325 MG tablet Take 325-650 mg by mouth every 6 (six) hours as needed. For general aches      . albuterol (PROVENTIL HFA;VENTOLIN HFA) 108 (90 BASE) MCG/ACT inhaler Inhale 2 puffs into the lungs every 6 (six) hours as needed. wheezing      . aspirin 81 MG chewable tablet Chew 81 mg by mouth daily.      . citalopram (CELEXA) 20 MG tablet Take 20 mg by mouth every evening.       . clonazePAM (KLONOPIN) 0.5 MG tablet Take 0.5 mg by mouth 2 (two) times daily as needed. For anxiety      . doxycycline (DORYX) 100 MG EC tablet Take 100 mg by mouth 2 (two) times daily.      . fish oil-omega-3 fatty acids 1000 MG capsule Take 1 g by mouth daily.       Marland Kitchen HYDROcodone-acetaminophen (NORCO/VICODIN) 5-325 MG per tablet Take 1 tablet by mouth every 4 (four) hours as needed.      Marland Kitchen  HYDROcodone-homatropine (HYCODAN) 5-1.5 MG/5ML syrup Take 5 mLs by mouth every 6 (six) hours as needed for cough.  240 mL  0  . lidocaine-prilocaine (EMLA) cream Apply topically as needed. Apply to [facility] A cath site 30-60 min before chemotherapy  30 g  0  . loratadine (CLARITIN) 10 MG tablet Take 10 mg by mouth daily.      . Multiple Vitamins-Minerals (MULTIVITAMIN WITH MINERALS) tablet Take 1 tablet by mouth daily.      . ondansetron (ZOFRAN) 8 MG tablet Take 1 tablet (8 mg total) by mouth every 8 (eight) hours as needed for nausea.  20 tablet  0  . PACLitaxel-protein bound (ABRAXANE) 100 MG injection Inject 175 mg into the vein once.       . prochlorperazine (COMPAZINE) 10 MG tablet TAKE 1 TABLET (10 MG TOTAL) BY MOUTH EVERY 6 (SIX) HOURS AS NEEDED.  60 tablet  3  . sucralfate (CARAFATE) 1 G tablet Take 1 tablet (1 g total) by mouth 4 (four) times daily. Dissolve in 15 cc water.  120 tablet  2  . triamcinolone cream (KENALOG) 0.1 % Apply topically 2 (two) times daily.       No current facility-administered medications  for this encounter.   Facility-Administered Medications Ordered in Other Encounters  Medication Dose Route Frequency Provider Last Rate Last Dose  . 0.9 %  sodium chloride infusion   Intravenous Once [name], MD      . CARBOplatin (PARAPLATIN) 490 mg in sodium chloride 0.9 % 250 mL chemo infusion  490 mg Intravenous Once [name], MD   490 mg at 08/30/12 1455  . dexamethasone (DECADRON) injection 20 mg  20 mg Intravenous Once [name], MD   20 mg at 08/30/12 1349  . ondansetron (ZOFRAN) IVPB 16 mg  16 mg Intravenous Once [name], MD   16 mg at 08/30/12 1349  . PACLitaxel-protein bound (ABRAXANE) chemo infusion 175 mg  90 mg/m2 (Treatment Plan Actual) Intravenous Once [name], MD   175 mg at 08/30/12 1425    Physical Findings: The patient is in no acute distress. Patient is alert and oriented.  weight is 161 lb (73.029 kg). His oral  temperature is 97.7 F (36.5 C). His blood pressure is 130/75 and his pulse is 69. His respiration is 20 and oxygen saturation is 98%. .   General: Well-developed, in no acute distress HEENT: Normocephalic, atraumatic Cardiovascular: Regular rate and rhythm Respiratory: Clear to auscultation bilaterally; slight hyperpigmentation in the chest region. No ongoing desquamation. A healing cyst is present in the mid back region posteriorly. GI: Soft, nontender, normal bowel sounds Extremities: No edema present   Lab Findings: Lab Results  Component Value Date   WBC 3.7* 08/30/2012   HGB 9.4* 08/30/2012   HCT 25.9* 08/30/2012   MCV 86.9 08/30/2012   PLT 100* 08/30/2012     Radiographic Findings: Dg Chest 2 View  08/24/2012  *RADIOLOGY REPORT*  Clinical Data: Chest pain.  Hypertension.  Emesis.  CHEST - 2 VIEW  Comparison: Multiple exams, including 06/02/2012  Findings: Right internal jugular power port catheter tip projects over the SVC.  Heart size within normal limits. Prior left central hilar mass is dramatically reduced in size compared to prior PET CT and prior conventional radiographs.  No pleural effusion observed.  IMPRESSION:  1.  The left central hilar mass is dramatically reduced conspicuity compared to prior exam from July. 2.  Right power port tip projects over the SVC.   Original Report Authenticated By: [name], M.D.     Impression:    61 year old male with metastatic non-small cell lung cancer. He is status post a short course of palliative radiotherapy to the left hilar mass and is continuing on with palliative chemotherapy.  Plan:  The patient received additional chemotherapy today. He asked to come into clinic after this to make it more convenient than coming back tomorrow which worked out fine. He is scheduled for repeat imaging early next month with followup with Dr. Arbutus Ped to determine how best to subsequently proceed. He will followup in our clinic on a when  necessary basis.   [name], M.D., Ph.D.

## 2012-08-30 NOTE — Patient Instructions (Signed)
[facility] Discharge Instructions for Patients Receiving Chemotherapy  Today you received the following chemotherapy agents Carboplatin/Abraxane To help prevent nausea and vomiting after your treatment, we encourage you to take your nausea medication as prescribed.  If you develop nausea and vomiting that is not controlled by your nausea medication, call the clinic. If it is after clinic hours your family physician or the after hours number for the clinic or go to the Emergency Department.   BELOW ARE SYMPTOMS THAT SHOULD BE REPORTED IMMEDIATELY:  *FEVER GREATER THAN 100.5 F  *CHILLS WITH OR WITHOUT FEVER  NAUSEA AND VOMITING THAT IS NOT CONTROLLED WITH YOUR NAUSEA MEDICATION  *UNUSUAL SHORTNESS OF BREATH  *UNUSUAL BRUISING OR BLEEDING  TENDERNESS IN MOUTH AND THROAT WITH OR WITHOUT PRESENCE OF ULCERS  *URINARY PROBLEMS  *BOWEL PROBLEMS  UNUSUAL RASH Items with * indicate a potential emergency and should be followed up as soon as possible.  One of the nurses will contact you 24 hours after your treatment. Please let the nurse know about any problems that you may have experienced. Feel free to call the clinic you have any questions or concerns. The clinic phone number is [phone].   I have been informed and understand all the instructions given to me. I know to contact the clinic, my physician, or go to the Emergency Department if any problems should occur. I do not have any questions at this time, but understand that I may call the clinic during office hours   should I have any questions or need assistance in obtaining follow up care.    __________________________________________  _____________  __________ Signature of Patient or Authorized Representative            Date                   Time    __________________________________________ Nurse's Signature    

## 2012-08-30 NOTE — Telephone Encounter (Signed)
gv pt wife appt schedule for October thru December including ct for 11/6.

## 2012-08-30 NOTE — Patient Instructions (Addendum)
Continue with your lab and chemotherapy as scheduled Followup with Dr. Arbutus Ped in 3 weeks with restaging CT scan of your chest abdomen and pelvis to reevaluate your disease

## 2012-08-30 NOTE — Progress Notes (Addendum)
Pt denies pain, loss of appetite, reports fatigue on some days. Pt has dry cough, takes cough syrup w/good relief. Pt states he has to eat slowly, avoid spicy foods.  Pt just completed chemo today.

## 2012-08-31 ENCOUNTER — Other Ambulatory Visit: Payer: Self-pay | Admitting: Medical Oncology

## 2012-08-31 ENCOUNTER — Ambulatory Visit
Admission: RE | Admit: 2012-08-31 | Payer: BC Managed Care – PPO | Source: Ambulatory Visit | Admitting: Radiation Oncology

## 2012-08-31 DIAGNOSIS — R112 Nausea with vomiting, unspecified: Secondary | ICD-10-CM

## 2012-08-31 DIAGNOSIS — T451X5A Adverse effect of antineoplastic and immunosuppressive drugs, initial encounter: Secondary | ICD-10-CM

## 2012-08-31 MED ORDER — ONDANSETRON HCL 8 MG PO TABS: 8.0000 mg | ORAL_TABLET | Freq: Three times a day (TID) | ORAL | Status: DC | PRN

## 2012-08-31 NOTE — Telephone Encounter (Signed)
Wife requests refill for zofran to prevent nausea-" it really helps him"- I called in refill

## 2012-09-01 NOTE — Progress Notes (Signed)
[facility] Telephone:([phone]) [phone]   Fax:([phone]  OFFICE PROGRESS NOTE  [name], PA [facility] [address] [place] [address]  DIAGNOSIS: Metastatic non-small cell lung cancer, squamous cell carcinoma diagnosed in August of 2013.  PRIOR THERAPY: status post palliative radiotherapy to the left hilar mass under the care of Dr. Mitzi Hansen.  CURRENT THERAPY:  1) Systemic chemotherapy with carboplatin for AUC of 5 on day 1 and Abraxane 100 mg/M2 on days 1, 8 and 15 every 3 weeks. The patient is status post 2 cycle  INTERVAL HISTORY: Derrick Crosby 61 y.o. male returns to the clinic today for followup visit accompanied his wife and sister-in-law. He tolerated his first cycle of chemotherapy relatively well with the exception of some nausea and dry heaves. He has not been taking his Compazine quite as prescribed/scheduled.  He presents to proceed with cycle #3 of his systemic chemotherapy with carboplatin and Abraxane.  He denied any fever or chills. He denied having any significant weight loss or night sweats. He denied having any significant chest pain, shortness of breath except with exertion, no cough or hemoptysis. The patient denied having any significant peripheral neuropathy.  MEDICAL HISTORY: Past Medical History  Diagnosis Date  . Hypertension   . Hyperglyceridemia, pure   . COPD (chronic obstructive pulmonary disease)   . Cancer     Skin  Cancer - Left ear.-  . Hemoptysis   . Skin cancer     left ear  . Anxiety   . Depression   . Lung cancer 06/13/12    lung mass /right hilar suspicious for primary bronchogenic ca  . Hemoptysis   . Seasonal allergies   . History of radiation therapy 07/05/12-07/26/12    lung ca     ALLERGIES:   has no known allergies.  MEDICATIONS:  Current Outpatient Prescriptions  Medication Sig Dispense Refill  . acetaminophen (TYLENOL) 325 MG tablet Take 325-650 mg by mouth every 6 (six) hours as  needed. For general aches      . albuterol (PROVENTIL HFA;VENTOLIN HFA) 108 (90 BASE) MCG/ACT inhaler Inhale 2 puffs into the lungs every 6 (six) hours as needed. wheezing      . aspirin 81 MG chewable tablet Chew 81 mg by mouth daily.      . citalopram (CELEXA) 20 MG tablet Take 20 mg by mouth every evening.       . clonazePAM (KLONOPIN) 0.5 MG tablet Take 0.5 mg by mouth 2 (two) times daily as needed. For anxiety      . doxycycline (DORYX) 100 MG EC tablet Take 100 mg by mouth 2 (two) times daily.      . fish oil-omega-3 fatty acids 1000 MG capsule Take 1 g by mouth daily.       Marland Kitchen HYDROcodone-acetaminophen (NORCO/VICODIN) 5-325 MG per tablet Take 1 tablet by mouth every 4 (four) hours as needed.      Marland Kitchen HYDROcodone-homatropine (HYCODAN) 5-1.5 MG/5ML syrup Take 5 mLs by mouth every 6 (six) hours as needed for cough.  240 mL  0  . lidocaine-prilocaine (EMLA) cream Apply topically as needed. Apply to [facility] A cath site 30-60 min before chemotherapy  30 g  0  . loratadine (CLARITIN) 10 MG tablet Take 10 mg by mouth daily.      . Multiple Vitamins-Minerals (MULTIVITAMIN WITH MINERALS) tablet Take 1 tablet by mouth daily.      . ondansetron (ZOFRAN) 8 MG tablet Take 1 tablet (  8 mg total) by mouth every 8 (eight) hours as needed for nausea.  20 tablet  0  . PACLitaxel-protein bound (ABRAXANE) 100 MG injection Inject 175 mg into the vein once.       . prochlorperazine (COMPAZINE) 10 MG tablet TAKE 1 TABLET (10 MG TOTAL) BY MOUTH EVERY 6 (SIX) HOURS AS NEEDED.  60 tablet  3  . sucralfate (CARAFATE) 1 G tablet Take 1 tablet (1 g total) by mouth 4 (four) times daily. Dissolve in 15 cc water.  120 tablet  2  . triamcinolone cream (KENALOG) 0.1 % Apply topically 2 (two) times daily.        SURGICAL HISTORY:  Past Surgical History  Procedure Date  . Tympanoplasty   . Video bronchoscopy 06/13/12    with endobronchial u/s/ with biospy left hilar mass/Dr. Lonia Farber  . Tonsillectomy     child     REVIEW OF SYSTEMS:  A comprehensive review of systems was negative except for: Constitutional: positive for fatigue Respiratory: positive for dyspnea on exertion Gastrointestinal: positive for nausea   PHYSICAL EXAMINATION: General appearance: alert, cooperative and no distress Head: Normocephalic, without obvious abnormality, atraumatic Neck: no adenopathy Lymph nodes: Cervical, supraclavicular, and axillary nodes normal. Resp: clear to auscultation bilaterally Cardio: regular rate and rhythm, S1, S2 normal, no murmur, click, rub or gallop GI: soft, non-tender; bowel sounds normal; no masses,  no organomegaly Extremities: extremities normal, atraumatic, no cyanosis or edema Neurologic: Alert and oriented X 3, normal strength and tone. Normal symmetric reflexes. Normal coordination and gait Skin: The left  lower mid back there is an approximately 0.5 cm area of erythema with a central soft eschar. No active bleeding or purulent drainage. The area is nontender.   ECOG PERFORMANCE STATUS: 1 - Symptomatic but completely ambulatory  Blood pressure 108/66, pulse 64, temperature 97.7 F (36.5 C), temperature source Oral, resp. rate 20, height 5\' 4"  (1.626 m), weight 161 lb 4.8 oz (73.165 kg).  LABORATORY DATA: Lab Results  Component Value Date   WBC 3.7* 08/30/2012   HGB 9.4* 08/30/2012   HCT 25.9* 08/30/2012   MCV 86.9 08/30/2012   PLT 100* 08/30/2012      Chemistry      Component Value Date/Time   NA 137 08/30/2012 1131   NA 137 08/24/2012 1627   K 4.3 08/30/2012 1131   K 3.7 08/24/2012 1627   CL 105 08/30/2012 1131   CL 100 08/24/2012 1627   CO2 22 08/30/2012 1131   CO2 27 08/24/2012 1627   BUN 24.0 08/30/2012 1131   BUN 24* 08/24/2012 1627   CREATININE 1.1 08/30/2012 1131   CREATININE 1.05 08/24/2012 1627      Component Value Date/Time   CALCIUM 9.4 08/30/2012 1131   CALCIUM 9.2 08/24/2012 1627   ALKPHOS 103 08/30/2012 1131   ALKPHOS 95 08/24/2012 1627   AST 21  08/30/2012 1131   AST 25 08/24/2012 1627   ALT 26 08/30/2012 1131   ALT 41 08/24/2012 1627   BILITOT 0.50 08/30/2012 1131   BILITOT 0.6 08/24/2012 1627       RADIOGRAPHIC STUDIES: Mr Laqueta Jean AV Contrast  07/03/12  *RADIOLOGY REPORT*  Clinical Data: 61 year old male with new diagnosis of lung cancer. Staging.  Uncontrollable cough.  MRI HEAD WITHOUT AND WITH CONTRAST  Technique:  Multiplanar, multiecho pulse sequences of the brain and surrounding structures were obtained according to standard protocol without and with intravenous contrast  Contrast: 15mL MULTIHANCE GADOBENATE DIMEGLUMINE 529 MG/ML IV SOLN  Comparison: None.  Findings: Motion artifact on postcontrast images. No abnormal enhancement identified.  No restricted diffusion to suggest acute infarction.  No midline shift, mass effect, evidence of mass lesion, ventriculomegaly, extra-axial collection or acute intracranial hemorrhage.  Cervicomedullary junction and pituitary are within normal limits.  Major intracranial vascular flow voids are preserved. Wallace Cullens and white matter signal is within normal limits throughout the brain.  Grossly negative visualized cervical spine.  Visualized bone marrow signal is within normal limits.  Visualized orbit soft tissues are within normal limits.  Visualized paranasal sinuses and mastoids are clear.  Negative scalp soft tissues.  IMPRESSION: Postcontrast images degraded by motion despite repeated imaging attempts. No acute or metastatic intracranial abnormality identified.  Original Report Authenticated By: Harley Hallmark, M.D.   Nm Pet Image Initial (pi) Skull Base To Thigh  06/16/2012  *RADIOLOGY REPORT*  Clinical Data: Initial treatment strategy for lung cancer.  NUCLEAR MEDICINE PET SKULL BASE TO THIGH  Fasting Blood Glucose:  92  Technique:  19.4 mCi F-18 FDG was injected intravenously. CT data was obtained and used for attenuation correction and anatomic localization only.  (This was not acquired as a  diagnostic CT examination.) Additional exam technical data entered on technologist worksheet.  Comparison:  08/07/2012  Findings:  Neck: No hypermetabolic lymph nodes in the neck.  Chest:  Large central left lung mass along the minor fissure involves the upper lobe, lower lobe, and middle mediastinum, with maximum standard uptake value of 17.6.  Small prevascular node measuring 9 mm in short axis diameter has a maximum standard uptake value of 6.1.  Emphysema is present.  Postobstructive pneumonitis noted in the left lower lobe, with complete collapse of the left lower lobe bronchus due to the dominant mass.  Abdomen/Pelvis:  1.3 cm right adrenal mass has a maximum standard uptake value of 11.8, compatible with metastatic disease.  A 1.4 cm left adrenal mass has a maximum standard uptake value of 11.7 cm, compatible with malignancy.  Small gastric diverticulum noted posteriorly.  Aortoiliac atherosclerotic calcification noted.  A small umbilical hernia contains adipose tissue.  High activity along the descending colon is most likely physiologic.  Bilateral nonobstructive nephrolithiasis noted.  Several hypodense liver lesions are probably cysts although are technically nonspecific.  No definite hypermetabolic activity in the liver is observed.  [place]:  No focal hypermetabolic activity to suggest skeletal metastasis.  IMPRESSION:  1.  Large hypermetabolic central left hilar mass with medial mediastinal invasion and a pathologic hypermetabolic prevascular lymph node. 2.  Bilateral adrenal metastatic lesions. 3.  Bilateral nonobstructive nephrolithiasis.  Original Report Authenticated By: [name], M.D.   Ir Fluoro Guide Cv Line Right  06/30/2012  *RADIOLOGY REPORT*  Clinical Data/Indication: Lung cancer  TUNNEL POWER PORT PLACEMENT WITH SUBCUTANEOUS POCKET UTILIZING ULTRASOUND & FLOUROSCOPY  Sedation: Versed 2.0 mg, Fentanyl 200 mcg.  Total Moderate Sedation Time: 35 minutes.  As antibiotic  prophylaxis, Ancef  was ordered pre-procedure and administered intravenously within one hour of incision.  Fluoroscopy Time: 0.4 minutes.  Procedure:  After written informed consent was obtained, patient was placed in the supine position on angiographic table. The right neck and chest was prepped and draped in a sterile fashion. Lidocaine was utilized for local anesthesia.  The right internal jugular vein was noted to be patent initially with ultrasound. Under sonographic guidance, a micropuncture needle was inserted into the right IJ vein (Ultrasound and fluoroscopic image documentation was performed). The needle was removed over an 018 wire which was  exchanged for a Amplatz.  This was advanced into the IVC.  An 8-French dilator was advanced over the Amplatz.  A small incision was made in the right upper chest over the anterior right second rib.  Utilizing blunt dissection, a subcutaneous pocket was created in the caudal direction. The pocket was irrigated with a copious amount of sterile normal saline.  The port catheter was tunneled from the chest incision, and out the neck incision.  The reservoir was inserted into the subcutaneous pocket and secured with two 3-0 Ethilon stitches.  A peel-away sheath was advanced over the Amplatz wire.  The port catheter was cut to measure length and inserted through the peel-away sheath. The peel-away sheath was removed.  The chest incision was closed with 3-0 Vicryl interrupted stitches for the subcutaneous tissue and a running of 4-0 Vicryl subcuticular stitch for the skin.  The neck incision was closed with a 4-0 Vicryl subcuticular stitch. Derma-bond was applied to both surgical incisions.  The port reservoir was flushed and instilled with heparinized saline.  No complications.  Findings:  A right IJ vein Port-A-Cath is in place with its tip at the cavoatrial junction.  IMPRESSION: Successful 8 French right internal jugular vein power port placement with its tip at the  SVC/RA junction.   Original Report Authenticated By: Donavan Burnet, M.D.    Ir US Guide Vasc Access Right  06/30/2012  *RADIOLOGY REPORT*  Clinical Data/Indication: Lung cancer  TUNNEL POWER PORT PLACEMENT WITH SUBCUTANEOUS POCKET UTILIZING ULTRASOUND & FLOUROSCOPY  Sedation: Versed 2.0 mg, Fentanyl 200 mcg.  Total Moderate Sedation Time: 35 minutes.  As antibiotic prophylaxis, Ancef  was ordered pre-procedure and administered intravenously within one hour of incision.  Fluoroscopy Time: 0.4 minutes.  Procedure:  After written informed consent was obtained, patient was placed in the supine position on angiographic table. The right neck and chest was prepped and draped in a sterile fashion. Lidocaine was utilized for local anesthesia.  The right internal jugular vein was noted to be patent initially with ultrasound. Under sonographic guidance, a micropuncture needle was inserted into the right IJ vein (Ultrasound and fluoroscopic image documentation was performed). The needle was removed over an 018 wire which was exchanged for a Amplatz.  This was advanced into the IVC.  An 8-French dilator was advanced over the Amplatz.  A small incision was made in the right upper chest over the anterior right second rib.  Utilizing blunt dissection, a subcutaneous pocket was created in the caudal direction. The pocket was irrigated with a copious amount of sterile normal saline.  The port catheter was tunneled from the chest incision, and out the neck incision.  The reservoir was inserted into the subcutaneous pocket and secured with two 3-0 Ethilon stitches.  A peel-away sheath was advanced over the Amplatz wire.  The port catheter was cut to measure length and inserted through the peel-away sheath. The peel-away sheath was removed.  The chest incision was closed with 3-0 Vicryl interrupted stitches for the subcutaneous tissue and a running of 4-0 Vicryl subcuticular stitch for the skin.  The neck incision was closed with a  4-0 Vicryl subcuticular stitch. Derma-bond was applied to both surgical incisions.  The port reservoir was flushed and instilled with heparinized saline.  No complications.  Findings:  A right IJ vein Port-A-Cath is in place with its tip at the cavoatrial junction.  IMPRESSION: Successful 8 French right internal jugular vein power port placement with its tip at the SVC/RA junction.  Original Report Authenticated By: Donavan Burnet, M.D.     ASSESSMENT/PLAN: This is a very pleasant 61 years old white male with metastatic non-small cell lung cancer, squamous cell carcinoma. He is status post palliative radiotherapy to the left hilar mass. He is status post one cycle of systemic chemotherapy with carboplatin and Abraxane. The patient is tolerating his treatment fairly well except for mild nausea. Patient was discussed with Dr. Arbutus Ped. He'll proceed with cycle #3 of his systemic chemotherapy with carboplatin for an AUC of 5 on day 1 and Abraxane 100 mg per meter square given on days 1, 8 and 15 every 3 weeks. He'll followup with Dr. Arbutus Ped in 3 weeks with a repeat CBC differential, C. met and CT of the chest, abdomen and pelvis with contrast to reevaluate his disease. He is encouraged to take his antiemetics as prescribed as needed for his nausea/dry heaves and vomiting.  Laural Benes, Holland Nickson E, PA-C   All questions were answered. The patient knows to call the clinic with any problems, questions or concerns. We can certainly see the patient much sooner if necessary.  I spent 20 minutes counseling the patient face to face. The total time spent in the appointment was 30 minutes.

## 2012-09-04 ENCOUNTER — Emergency Department (HOSPITAL_COMMUNITY)
Admission: EM | Admit: 2012-09-04 | Discharge: 2012-09-04 | Disposition: A | Discharge status: Home / Self Care | Payer: BC Managed Care – PPO | Attending: Emergency Medicine | Admitting: Emergency Medicine

## 2012-09-04 ENCOUNTER — Encounter (HOSPITAL_COMMUNITY): Payer: Self-pay | Admitting: Emergency Medicine

## 2012-09-04 ENCOUNTER — Ambulatory Visit (INDEPENDENT_AMBULATORY_CARE_PROVIDER_SITE_OTHER): Payer: BC Managed Care – PPO | Admitting: General Surgery

## 2012-09-04 ENCOUNTER — Telehealth: Payer: Self-pay | Admitting: Medical Oncology

## 2012-09-04 ENCOUNTER — Encounter (INDEPENDENT_AMBULATORY_CARE_PROVIDER_SITE_OTHER): Payer: Self-pay | Admitting: General Surgery

## 2012-09-04 ENCOUNTER — Emergency Department (HOSPITAL_COMMUNITY): Payer: BC Managed Care – PPO

## 2012-09-04 ENCOUNTER — Telehealth: Payer: Self-pay | Admitting: *Deleted

## 2012-09-04 VITALS — BP 130/72 | HR 96 | Temp 97.0°F | Resp 20 | Ht 70.0 in | Wt 162.0 lb

## 2012-09-04 DIAGNOSIS — R143 Flatulence: Secondary | ICD-10-CM | POA: Insufficient documentation

## 2012-09-04 DIAGNOSIS — Z79899 Other long term (current) drug therapy: Secondary | ICD-10-CM | POA: Insufficient documentation

## 2012-09-04 DIAGNOSIS — I10 Essential (primary) hypertension: Secondary | ICD-10-CM | POA: Insufficient documentation

## 2012-09-04 DIAGNOSIS — J4489 Other specified chronic obstructive pulmonary disease: Secondary | ICD-10-CM | POA: Insufficient documentation

## 2012-09-04 DIAGNOSIS — Z85828 Personal history of other malignant neoplasm of skin: Secondary | ICD-10-CM | POA: Insufficient documentation

## 2012-09-04 DIAGNOSIS — C3490 Malignant neoplasm of unspecified part of unspecified bronchus or lung: Secondary | ICD-10-CM | POA: Insufficient documentation

## 2012-09-04 DIAGNOSIS — Z7982 Long term (current) use of aspirin: Secondary | ICD-10-CM | POA: Insufficient documentation

## 2012-09-04 DIAGNOSIS — L723 Sebaceous cyst: Secondary | ICD-10-CM | POA: Insufficient documentation

## 2012-09-04 DIAGNOSIS — F411 Generalized anxiety disorder: Secondary | ICD-10-CM | POA: Insufficient documentation

## 2012-09-04 DIAGNOSIS — K639 Disease of intestine, unspecified: Secondary | ICD-10-CM

## 2012-09-04 DIAGNOSIS — R141 Gas pain: Secondary | ICD-10-CM | POA: Insufficient documentation

## 2012-09-04 DIAGNOSIS — K5900 Constipation, unspecified: Secondary | ICD-10-CM | POA: Insufficient documentation

## 2012-09-04 DIAGNOSIS — F3289 Other specified depressive episodes: Secondary | ICD-10-CM | POA: Insufficient documentation

## 2012-09-04 DIAGNOSIS — Z87891 Personal history of nicotine dependence: Secondary | ICD-10-CM | POA: Insufficient documentation

## 2012-09-04 DIAGNOSIS — R142 Eructation: Secondary | ICD-10-CM | POA: Insufficient documentation

## 2012-09-04 DIAGNOSIS — F329 Major depressive disorder, single episode, unspecified: Secondary | ICD-10-CM | POA: Insufficient documentation

## 2012-09-04 DIAGNOSIS — J449 Chronic obstructive pulmonary disease, unspecified: Secondary | ICD-10-CM | POA: Insufficient documentation

## 2012-09-04 LAB — CBC WITH DIFFERENTIAL/PLATELET
Eosinophils Relative: 0 % (ref 0–5)
HCT: 25.7 % — ABNORMAL LOW (ref 39.0–52.0)
Lymphs Abs: 0.7 10*3/uL (ref 0.7–4.0)
MCV: 85.7 fL (ref 78.0–100.0)
Monocytes Relative: 3 % (ref 3–12)
Neutro Abs: 3.8 10*3/uL (ref 1.7–7.7)
RBC: 3 MIL/uL — ABNORMAL LOW (ref 4.22–5.81)
WBC: 4.6 10*3/uL (ref 4.0–10.5)

## 2012-09-04 LAB — POCT I-STAT, CHEM 8
BUN: 24 mg/dL — ABNORMAL HIGH (ref 6–23)
Calcium, Ion: 1.17 mmol/L (ref 1.13–1.30)
Chloride: 102 mEq/L (ref 96–112)
Glucose, Bld: 106 mg/dL — ABNORMAL HIGH (ref 70–99)
Potassium: 3.9 mEq/L (ref 3.5–5.1)

## 2012-09-04 MED ORDER — SULFAMETHOXAZOLE-TRIMETHOPRIM 800-160 MG PO TABS: 15.0000 | ORAL_TABLET | Freq: Two times a day (BID) | ORAL | Status: AC

## 2012-09-04 MED ORDER — POLYETHYLENE GLYCOL 3350 17 GM/SCOOP PO POWD: 17.0000 g | Freq: Every day | ORAL | Status: DC

## 2012-09-04 NOTE — Progress Notes (Signed)
The patient comes in with an infected sebaceous cyst of his back. He had a cyst infected before that was incised and drained previously. This wound is smaller however does appear to have some fluctuant areas. With a Betadine prep and local anesthetic an 11 blade was used to make an incision transversely across the cyst. Sebum was noted on the inside. There was pus. It was packed with approximately 1 foot of quarter-inch iodoform gauze. He is return to see me in one week. Wife will remove the packing in 2 days. I will put him on some Bactrim therapy as he was treated with doxycycline before.

## 2012-09-04 NOTE — ED Provider Notes (Signed)
History     CSN: [id number]  Arrival date & time 09/04/12  1744   First MD Initiated Contact with Patient 09/04/12 2008      Chief Complaint  Patient presents with  . Constipation    (Consider location/radiation/quality/duration/timing/severity/associated sxs/prior treatment) HPI Comments: Patient with 2 days of No BM despite taking fleets enema, suppository and a laxative.  Current taking chemo for lung Ca poor appetite  Patient is a 61 y.o. male presenting with constipation. The history is provided by the patient and the spouse.  Constipation  The current episode started today. The problem occurs occasionally. The problem has been unchanged. The patient is experiencing no pain. Pertinent negatives include no fever, no abdominal pain and no nausea.    Past Medical History  Diagnosis Date  . Hypertension   . Hyperglyceridemia, pure   . COPD (chronic obstructive pulmonary disease)   . Cancer     Skin  Cancer - Left ear.-  . Hemoptysis   . Skin cancer     left ear  . Anxiety   . Depression   . Lung cancer 06/13/12    lung mass /right hilar suspicious for primary bronchogenic ca  . Hemoptysis   . Seasonal allergies   . History of radiation therapy 07/05/12-07/26/12    lung ca     Past Surgical History  Procedure Date  . Tympanoplasty   . Video bronchoscopy 06/13/12    with endobronchial u/s/ with biospy left hilar mass/Dr. Lonia Farber  . Tonsillectomy     child    Family History  Problem Relation Age of Onset  . Lung cancer Mother   . Cancer Mother     lung former smoker, age 76 now    History  Substance Use Topics  . Smoking status: Former Smoker -- 0.5 packs/day for 50 years    Types: Cigarettes    Quit date: 06/14/2012  . Smokeless tobacco: Never Used   Comment: pt trying to quit  . Alcohol Use: Yes     occasionally beer       Review of Systems  Constitutional: Negative for fever and chills.  Gastrointestinal: Positive for constipation  and abdominal distention. Negative for nausea and abdominal pain.  Neurological: Negative for dizziness and weakness.    Allergies  Review of patient's allergies indicates no known allergies.  Home Medications   Current Outpatient Rx  Name Route Sig Dispense Refill  . ACETAMINOPHEN 325 MG PO TABS Oral Take 650 mg by mouth every 6 (six) hours as needed. For general aches    . ALBUTEROL SULFATE HFA 108 (90 BASE) MCG/ACT IN AERS Inhalation Inhale 2 puffs into the lungs every 6 (six) hours as needed. wheezing    . ASPIRIN EC 81 MG PO TBEC Oral Take 81 mg by mouth daily.    Marland Kitchen CITALOPRAM HYDROBROMIDE 20 MG PO TABS Oral Take 20 mg by mouth every evening.     Marland Kitchen CLONAZEPAM 0.5 MG PO TABS Oral Take 0.5 mg by mouth 2 (two) times daily as needed. For anxiety    . OMEGA-3 FATTY ACIDS 1000 MG PO CAPS Oral Take 1 g by mouth daily.     Marland Kitchen HYDROCODONE-ACETAMINOPHEN 5-325 MG PO TABS Oral Take 1 tablet by mouth every 4 (four) hours as needed.    Marland Kitchen HYDROCODONE-HOMATROPINE 5-1.5 MG/5ML PO SYRP Oral Take 5 mLs by mouth every 6 (six) hours as needed for cough. 240 mL 0  . LIDOCAINE-PRILOCAINE 2.5-2.5 % EX CREA Topical Apply 1  application topically as needed. Apply to [facility] A cath site 30-60 min before chemotherapy. Used every Wednesday    . LORATADINE 10 MG PO TABS Oral Take 10 mg by mouth daily.    . MULTI-VITAMIN/MINERALS PO TABS Oral Take 1 tablet by mouth daily.    Marland Kitchen ONDANSETRON HCL 8 MG PO TABS Oral Take 8 mg by mouth every 8 (eight) hours as needed. Nausea    . PACLITAXEL PROTEIN-BOUND CHEMO INJECTION 100 MG Intravenous Inject 175 mg into the vein once.     Marland Kitchen PROCHLORPERAZINE MALEATE 10 MG PO TABS Oral Take 10 mg by mouth every 6 (six) hours as needed.    . SUCRALFATE 1 G PO TABS Oral Take 1 tablet (1 g total) by mouth 4 (four) times daily. Dissolve in 15 cc water. 120 tablet 2  . SULFAMETHOXAZOLE-TRIMETHOPRIM 800-160 MG PO TABS Oral Take 15 tablets by mouth 2 (two) times daily. 6 tablet 0  . TRIAMCINOLONE  ACETONIDE 0.1 % EX CREA Topical Apply topically 2 (two) times daily.    Marland Kitchen POLYETHYLENE GLYCOL 3350 PO POWD Oral Take 17 g by mouth daily. 255 g 0    BP 142/78  Pulse 102  Temp 97.8 F (36.6 C) (Oral)  Resp 20  SpO2 100%  Physical Exam  Constitutional: He is oriented to person, place, and time. He appears well-developed and well-nourished.  Eyes: Pupils are equal, round, and reactive to light.  Cardiovascular: Normal rate.   Pulmonary/Chest: Effort normal.  Abdominal: Soft. He exhibits no distension. Bowel sounds are decreased. There is no tenderness.       No stool in rectal vault  Musculoskeletal: Normal range of motion.  Neurological: He is alert and oriented to person, place, and time.  Skin: Skin is warm. There is pallor.    ED Course  Procedures (including critical care time)  Labs Reviewed  CBC WITH DIFFERENTIAL - Abnormal; Notable for the following:    RBC 3.00 (*)     Hemoglobin 9.4 (*)     HCT 25.7 (*)     MCHC 36.6 (*)  PRE-WARMING TECHNIQUE USED   RDW 15.9 (*)     Platelets 104 (*)     Neutrophils Relative 82 (*)     All other components within normal limits  POCT I-STAT, CHEM 8 - Abnormal; Notable for the following:    BUN 24 (*)     Glucose, Bld 106 (*)     Hemoglobin 9.2 (*)     HCT 27.0 (*)     All other components within normal limits   Dg Abd Acute W/chest  09/04/2012  *RADIOLOGY REPORT*  Clinical Data: Constipation and abdominal pain.  History of lung carcinoma.  ACUTE ABDOMEN SERIES (ABDOMEN 2 VIEW & CHEST 1 VIEW)  Comparison: Chest x-ray dated 08/24/2012  Findings: The lungs are clear.  Port-A-Cath positioning stable. Abdominal films show no evidence of acute bowel obstruction or free intraperitoneal air.  No evidence of fecal impaction.  No abnormal calcifications.  Mild degenerative changes are present in the lower lumbar spine.  IMPRESSION: No acute findings.   Original Report Authenticated By: Reola Calkins, M.D.      1. Bowel trouble        MDM  Spoke with patient and family will start the Miralax tonight and follow  Up with Dr. Gwenyth Bouillon this week        Arman Filter, NP 09/04/12 2200

## 2012-09-04 NOTE — Telephone Encounter (Signed)
Wife states pt has " another cyst on his back , is constipated and having nosebleeds" . Wife has appt with surgeon for pt today at 4pm. Pt last BM was sat -he has taken two dulcolax suppositories, drank prune/apple  Juices and took enema this am. She reports his nose bleeds  are more  spotting " like when you have a sinus infection" . I told her to keep appt with surgeon and to go to ED if he has persistant or progressive abdominal pain or nosebleeds. She voices understanding.

## 2012-09-04 NOTE — ED Notes (Signed)
Pt presenting to ed with c/o constipation. Pt denies abdominal pain, nausea and vomiting at this time

## 2012-09-04 NOTE — Telephone Encounter (Signed)
Wife and patient called reporting Derrick Crosby has not had a bm.  Last Carbo/Alimta was given 08-30-2012.  Reports he is passing little balls the past three days.  Derrick Crosby denies abdominal swelling but does have some abdominal discomfort.  Has nor been eating the last few days because he doesn't feel well.  Is not drinking a lot.  Has taken two ex-lax Sat, one on Sunday and taking an enema this morning.  Has senokot and ducosate sodium on hand.  Using hycodan syrup for cough but "hardly uses the vicodin pain pills". This nurse instructed to increase fluid intake especially water.  Take the senokot at bedtime every night to regulate bowels.  Suggested mag citrate and to call tomorrow with update of bowel status.  Asked about a cyst on his back that has appeared and a previous cyst was removed by Dr. Magnus Ivan.  Instructed to call Dr. Magnus Ivan about the cyst.

## 2012-09-04 NOTE — ED Notes (Signed)
Pt reported that he had just gone to the bathroom and had a medium bowel movement, states that he had hard stool first then followed by soft stool.  Pt states "but I still feel need to go".

## 2012-09-04 NOTE — ED Notes (Signed)
Pt is a Cancer pt of Dr. Gwenyth Bouillon. Pt is having Chemo for lung CA. Pt has not had a bowel movement in 2 days. Pt wife has given him 2 suppositories and enema a laxative and a stool softener with no results. Pt has urgency to go to bathroom but is unable to pass anything. Pt says his appetite is poor the past 2 days. Pt has had a back cyst removed today as well with a dressing placed.

## 2012-09-05 NOTE — ED Provider Notes (Signed)
Medical screening examination/treatment/procedure(s) were performed by non-physician practitioner and as supervising physician I was immediately available for consultation/collaboration  Mayrani Khamis R. Courtenay Creger, MD 09/05/12 0012 

## 2012-09-06 ENCOUNTER — Other Ambulatory Visit (HOSPITAL_BASED_OUTPATIENT_CLINIC_OR_DEPARTMENT_OTHER): Payer: BC Managed Care – PPO | Admitting: Lab

## 2012-09-06 ENCOUNTER — Ambulatory Visit (HOSPITAL_BASED_OUTPATIENT_CLINIC_OR_DEPARTMENT_OTHER): Payer: BC Managed Care – PPO

## 2012-09-06 VITALS — BP 112/71 | HR 79 | Temp 98.3°F

## 2012-09-06 DIAGNOSIS — C3410 Malignant neoplasm of upper lobe, unspecified bronchus or lung: Secondary | ICD-10-CM

## 2012-09-06 DIAGNOSIS — C3490 Malignant neoplasm of unspecified part of unspecified bronchus or lung: Secondary | ICD-10-CM

## 2012-09-06 DIAGNOSIS — Z5111 Encounter for antineoplastic chemotherapy: Secondary | ICD-10-CM

## 2012-09-06 DIAGNOSIS — C3400 Malignant neoplasm of unspecified main bronchus: Secondary | ICD-10-CM

## 2012-09-06 LAB — COMPREHENSIVE METABOLIC PANEL (CC13)
AST: 17 U/L (ref 5–34)
Albumin: 3.6 g/dL (ref 3.5–5.0)
BUN: 20 mg/dL (ref 7.0–26.0)
CO2: 27 mEq/L (ref 22–29)
Calcium: 9.1 mg/dL (ref 8.4–10.4)
Chloride: 102 mEq/L (ref 98–107)
Creatinine: 1.1 mg/dL (ref 0.7–1.3)
Glucose: 97 mg/dl (ref 70–99)
Potassium: 3.9 mEq/L (ref 3.5–5.1)

## 2012-09-06 LAB — CBC WITH DIFFERENTIAL/PLATELET
BASO%: 0.7 % (ref 0.0–2.0)
Basophils Absolute: 0 10*3/uL (ref 0.0–0.1)
HCT: 24.3 % — ABNORMAL LOW (ref 38.4–49.9)
HGB: 8.6 g/dL — ABNORMAL LOW (ref 13.0–17.1)
LYMPH%: 17.4 % (ref 14.0–49.0)
MCHC: 35.4 g/dL (ref 32.0–36.0)
MONO#: 0.4 10*3/uL (ref 0.1–0.9)
NEUT%: 73 % (ref 39.0–75.0)
Platelets: 94 10*3/uL — ABNORMAL LOW (ref 140–400)
WBC: 4.4 10*3/uL (ref 4.0–10.3)

## 2012-09-06 MED ORDER — SODIUM CHLORIDE 0.9 % IV SOLN
Freq: Once | INTRAVENOUS | Status: AC
  Administered 2012-09-06: 11:00:00 via INTRAVENOUS

## 2012-09-06 MED ORDER — DEXAMETHASONE SODIUM PHOSPHATE 10 MG/ML IJ SOLN
10.0000 mg | Freq: Once | INTRAMUSCULAR | Status: AC
  Administered 2012-09-06: 10 mg via INTRAVENOUS

## 2012-09-06 MED ORDER — ONDANSETRON 8 MG/50ML IVPB (CHCC)
8.0000 mg | Freq: Once | INTRAVENOUS | Status: AC
  Administered 2012-09-06: 8 mg via INTRAVENOUS

## 2012-09-06 MED ORDER — SODIUM CHLORIDE 0.9 % IJ SOLN
10.0000 mL | INTRAMUSCULAR | Status: DC | PRN
  Administered 2012-09-06: 10 mL
  Filled 2012-09-06: qty 10

## 2012-09-06 MED ORDER — HEPARIN SOD (PORK) LOCK FLUSH 100 UNIT/ML IV SOLN
500.0000 [IU] | Freq: Once | INTRAVENOUS | Status: AC | PRN
  Administered 2012-09-06: 500 [IU]
  Filled 2012-09-06: qty 5

## 2012-09-06 MED ORDER — PACLITAXEL PROTEIN-BOUND CHEMO INJECTION 100 MG
90.0000 mg/m2 | Freq: Once | INTRAVENOUS | Status: AC
  Administered 2012-09-06: 175 mg via INTRAVENOUS
  Filled 2012-09-06: qty 35

## 2012-09-06 NOTE — Patient Instructions (Addendum)
[facility] Discharge Instructions for Patients Receiving Chemotherapy  Today you received the following chemotherapy agents:  Abraxane  To help prevent nausea and vomiting after your treatment, we encourage you to take your nausea medication as ordered per MD.    If you develop nausea and vomiting that is not controlled by your nausea medication, call the clinic. If it is after clinic hours your family physician or the after hours number for the clinic or go to the Emergency Department.   BELOW ARE SYMPTOMS THAT SHOULD BE REPORTED IMMEDIATELY:  *FEVER GREATER THAN 100.5 F  *CHILLS WITH OR WITHOUT FEVER  NAUSEA AND VOMITING THAT IS NOT CONTROLLED WITH YOUR NAUSEA MEDICATION  *UNUSUAL SHORTNESS OF BREATH  *UNUSUAL BRUISING OR BLEEDING  TENDERNESS IN MOUTH AND THROAT WITH OR WITHOUT PRESENCE OF ULCERS  *URINARY PROBLEMS  *BOWEL PROBLEMS  UNUSUAL RASH Items with * indicate a potential emergency and should be followed up as soon as possible.   Please let the nurse know about any problems that you may have experienced. Feel free to call the clinic you have any questions or concerns. The clinic phone number is [phone].   I have been informed and understand all the instructions given to me. I know to contact the clinic, my physician, or go to the Emergency Department if any problems should occur. I do not have any questions at this time, but understand that I may call the clinic during office hours   should I have any questions or need assistance in obtaining follow up care.    __________________________________________  _____________  __________ Signature of Patient or Authorized Representative            Date                   Time    __________________________________________ Nurse's Signature    

## 2012-09-06 NOTE — Progress Notes (Signed)
Okay to tx with plts 94 per Dr Donnald Garre

## 2012-09-08 ENCOUNTER — Telehealth (INDEPENDENT_AMBULATORY_CARE_PROVIDER_SITE_OTHER): Payer: Self-pay | Admitting: General Surgery

## 2012-09-08 NOTE — Telephone Encounter (Signed)
Patient wife called in asking if patient can shower. She stated the wound has not closed yet. I advised that until the wound has completely closed to not get the area wet and follow instructions given regarding wound care. Patient has completed the antibiotics given and confirmed appointment to see Dr. Lindie Spruce on 09/12/12. Advised that when he does bathe to use antibacterial soap and that he would be given more instruction on care of the wound once evaluated by Dr. Lindie Spruce. Patient wife agreed.

## 2012-09-11 ENCOUNTER — Other Ambulatory Visit: Payer: Self-pay | Admitting: Internal Medicine

## 2012-09-11 DIAGNOSIS — C3490 Malignant neoplasm of unspecified part of unspecified bronchus or lung: Secondary | ICD-10-CM

## 2012-09-12 ENCOUNTER — Ambulatory Visit (INDEPENDENT_AMBULATORY_CARE_PROVIDER_SITE_OTHER): Payer: BC Managed Care – PPO | Admitting: General Surgery

## 2012-09-12 ENCOUNTER — Encounter (INDEPENDENT_AMBULATORY_CARE_PROVIDER_SITE_OTHER): Payer: Self-pay | Admitting: General Surgery

## 2012-09-12 VITALS — BP 128/68 | HR 70 | Temp 97.6°F | Resp 16 | Ht 70.0 in | Wt 161.4 lb

## 2012-09-12 DIAGNOSIS — L0291 Cutaneous abscess, unspecified: Secondary | ICD-10-CM

## 2012-09-12 DIAGNOSIS — L0390 Cellulitis, unspecified: Secondary | ICD-10-CM

## 2012-09-12 MED ORDER — HYDROCODONE-ACETAMINOPHEN 5-325 MG PO TABS: 1.0000 | ORAL_TABLET | ORAL | Status: DC | PRN

## 2012-09-12 MED ORDER — DOXYCYCLINE HYCLATE 100 MG PO TABS: 100.0000 mg | ORAL_TABLET | Freq: Two times a day (BID) | ORAL | Status: DC

## 2012-09-12 NOTE — Progress Notes (Signed)
The patient is status post incision and drainage of an infected sebaceous cyst. In spite of proper treatment he continues to have a halo of erythema. Upon probing the wound with a Q-tip nose minimal to no pus coming out of it although there was some sebum that came from the cavity.  I cleansed the wound with peroxide and cleaned it out with a dry Q-tip. We then placed some antibiotic ointment over the wound and covered it with a 4 x 4 gauze. I advised the patient's wife to allow him to shower pat the wound dry. Because of the continuing around the cellulitis I will place him back on doxycycline therapy 100 mg by mouth twice a day. Also this seems to be very uncomfortable for the patient I will prescribe for him some Norco or Vicodin tablets. I will need to see him back in a couple weeks.

## 2012-09-13 ENCOUNTER — Encounter (HOSPITAL_COMMUNITY)
Admission: RE | Admit: 2012-09-13 | Discharge: 2012-09-13 | Disposition: A | Discharge status: Home / Self Care | Payer: BC Managed Care – PPO | Source: Ambulatory Visit | Attending: Internal Medicine | Admitting: Internal Medicine

## 2012-09-13 ENCOUNTER — Other Ambulatory Visit: Payer: Self-pay | Admitting: *Deleted

## 2012-09-13 ENCOUNTER — Ambulatory Visit: Payer: BC Managed Care – PPO

## 2012-09-13 ENCOUNTER — Other Ambulatory Visit (HOSPITAL_BASED_OUTPATIENT_CLINIC_OR_DEPARTMENT_OTHER): Payer: BC Managed Care – PPO | Admitting: Lab

## 2012-09-13 ENCOUNTER — Ambulatory Visit (HOSPITAL_BASED_OUTPATIENT_CLINIC_OR_DEPARTMENT_OTHER): Payer: BC Managed Care – PPO

## 2012-09-13 ENCOUNTER — Ambulatory Visit (HOSPITAL_COMMUNITY)
Admission: RE | Admit: 2012-09-13 | Discharge: 2012-09-13 | Disposition: A | Discharge status: Home / Self Care | Payer: BC Managed Care – PPO | Source: Ambulatory Visit | Attending: Physician Assistant | Admitting: Physician Assistant

## 2012-09-13 ENCOUNTER — Encounter: Payer: Self-pay | Admitting: Internal Medicine

## 2012-09-13 VITALS — BP 128/78 | HR 81 | Temp 98.4°F | Resp 20

## 2012-09-13 DIAGNOSIS — D649 Anemia, unspecified: Secondary | ICD-10-CM

## 2012-09-13 DIAGNOSIS — C3490 Malignant neoplasm of unspecified part of unspecified bronchus or lung: Secondary | ICD-10-CM

## 2012-09-13 DIAGNOSIS — Z5111 Encounter for antineoplastic chemotherapy: Secondary | ICD-10-CM

## 2012-09-13 DIAGNOSIS — C3400 Malignant neoplasm of unspecified main bronchus: Secondary | ICD-10-CM

## 2012-09-13 DIAGNOSIS — C3410 Malignant neoplasm of upper lobe, unspecified bronchus or lung: Secondary | ICD-10-CM

## 2012-09-13 LAB — CBC WITH DIFFERENTIAL/PLATELET
BASO%: 0.4 % (ref 0.0–2.0)
Basophils Absolute: 0 10*3/uL (ref 0.0–0.1)
EOS%: 0.7 % (ref 0.0–7.0)
Eosinophils Absolute: 0 10*3/uL (ref 0.0–0.5)
HCT: 22.5 % — ABNORMAL LOW (ref 38.4–49.9)
HGB: 7.8 g/dL — ABNORMAL LOW (ref 13.0–17.1)
LYMPH%: 25.8 % (ref 14.0–49.0)
MCH: 31.2 pg (ref 27.2–33.4)
MCHC: 34.7 g/dL (ref 32.0–36.0)
MCV: 90 fL (ref 79.3–98.0)
MONO#: 0.5 10*3/uL (ref 0.1–0.9)
MONO%: 19.6 % — ABNORMAL HIGH (ref 0.0–14.0)
NEUT#: 1.5 10*3/uL (ref 1.5–6.5)
NEUT%: 53.5 % (ref 39.0–75.0)
Platelets: 120 10*3/uL — ABNORMAL LOW (ref 140–400)
RBC: 2.5 10*6/uL — ABNORMAL LOW (ref 4.20–5.82)
RDW: 16.8 % — ABNORMAL HIGH (ref 11.0–14.6)
WBC: 2.8 10*3/uL — ABNORMAL LOW (ref 4.0–10.3)
lymph#: 0.7 10*3/uL — ABNORMAL LOW (ref 0.9–3.3)
nRBC: 1 % — ABNORMAL HIGH (ref 0–0)

## 2012-09-13 LAB — COMPREHENSIVE METABOLIC PANEL (CC13)
ALT: 40 U/L (ref 0–55)
AST: 23 U/L (ref 5–34)
Albumin: 3.4 g/dL — ABNORMAL LOW (ref 3.5–5.0)
Alkaline Phosphatase: 110 U/L (ref 40–150)
BUN: 16 mg/dL (ref 7.0–26.0)
CO2: 26 mEq/L (ref 22–29)
Calcium: 8.9 mg/dL (ref 8.4–10.4)
Chloride: 99 mEq/L (ref 98–107)
Creatinine: 1 mg/dL (ref 0.7–1.3)
Glucose: 100 mg/dl — ABNORMAL HIGH (ref 70–99)
Potassium: 3.6 mEq/L (ref 3.5–5.1)
Sodium: 134 mEq/L — ABNORMAL LOW (ref 136–145)
Total Bilirubin: 0.64 mg/dL (ref 0.20–1.20)
Total Protein: 6.1 g/dL — ABNORMAL LOW (ref 6.4–8.3)

## 2012-09-13 LAB — PREPARE RBC (CROSSMATCH)

## 2012-09-13 LAB — ABO/RH: ABO/RH(D): O POS

## 2012-09-13 MED ORDER — SODIUM CHLORIDE 0.9 % IJ SOLN
10.0000 mL | INTRAMUSCULAR | Status: DC | PRN
  Filled 2012-09-13: qty 10

## 2012-09-13 MED ORDER — ACETAMINOPHEN 325 MG PO TABS
650.0000 mg | ORAL_TABLET | Freq: Once | ORAL | Status: AC
  Administered 2012-09-13: 650 mg via ORAL

## 2012-09-13 MED ORDER — HEPARIN SOD (PORK) LOCK FLUSH 100 UNIT/ML IV SOLN
250.0000 [IU] | Freq: Once | INTRAVENOUS | Status: DC | PRN
  Filled 2012-09-13: qty 5

## 2012-09-13 MED ORDER — PACLITAXEL PROTEIN-BOUND CHEMO INJECTION 100 MG
90.0000 mg/m2 | Freq: Once | INTRAVENOUS | Status: AC
  Administered 2012-09-13: 175 mg via INTRAVENOUS
  Filled 2012-09-13: qty 35

## 2012-09-13 MED ORDER — HEPARIN SOD (PORK) LOCK FLUSH 100 UNIT/ML IV SOLN
500.0000 [IU] | Freq: Every day | INTRAVENOUS | Status: DC | PRN
  Filled 2012-09-13: qty 5

## 2012-09-13 MED ORDER — SODIUM CHLORIDE 0.9 % IV SOLN: 250.0000 mL | Freq: Once | INTRAVENOUS | Status: DC

## 2012-09-13 MED ORDER — ONDANSETRON 8 MG/50ML IVPB (CHCC)
8.0000 mg | Freq: Once | INTRAVENOUS | Status: AC
  Administered 2012-09-13: 8 mg via INTRAVENOUS

## 2012-09-13 MED ORDER — ALTEPLASE 2 MG IJ SOLR
2.0000 mg | Freq: Once | INTRAMUSCULAR | Status: DC | PRN
  Filled 2012-09-13: qty 2

## 2012-09-13 MED ORDER — IOHEXOL 300 MG/ML  SOLN
100.0000 mL | Freq: Once | INTRAMUSCULAR | Status: AC | PRN
  Administered 2012-09-13: 100 mL via INTRAVENOUS

## 2012-09-13 MED ORDER — SODIUM CHLORIDE 0.9 % IV SOLN
Freq: Once | INTRAVENOUS | Status: AC
  Administered 2012-09-13: 12:00:00 via INTRAVENOUS

## 2012-09-13 MED ORDER — DIPHENHYDRAMINE HCL 50 MG/ML IJ SOLN
25.0000 mg | Freq: Once | INTRAMUSCULAR | Status: AC
  Administered 2012-09-13: 25 mg via INTRAVENOUS

## 2012-09-13 MED ORDER — SODIUM CHLORIDE 0.9 % IJ SOLN
10.0000 mL | INTRAMUSCULAR | Status: DC | PRN
  Administered 2012-09-13: 10 mL
  Filled 2012-09-13: qty 10

## 2012-09-13 MED ORDER — SODIUM CHLORIDE 0.9 % IJ SOLN
3.0000 mL | INTRAMUSCULAR | Status: DC | PRN
  Filled 2012-09-13: qty 10

## 2012-09-13 MED ORDER — HEPARIN SOD (PORK) LOCK FLUSH 100 UNIT/ML IV SOLN
500.0000 [IU] | Freq: Once | INTRAVENOUS | Status: AC | PRN
  Administered 2012-09-13: 500 [IU]
  Filled 2012-09-13: qty 5

## 2012-09-13 MED ORDER — DEXAMETHASONE SODIUM PHOSPHATE 10 MG/ML IJ SOLN
10.0000 mg | Freq: Once | INTRAMUSCULAR | Status: AC
  Administered 2012-09-13: 10 mg via INTRAVENOUS

## 2012-09-13 NOTE — Patient Instructions (Signed)
Patient aware of next appointment; discharged with no complaints. 

## 2012-09-13 NOTE — Progress Notes (Signed)
Put disability form on nurse's desk. °

## 2012-09-14 ENCOUNTER — Telehealth: Payer: Self-pay | Admitting: *Deleted

## 2012-09-14 ENCOUNTER — Other Ambulatory Visit: Payer: Self-pay | Admitting: *Deleted

## 2012-09-14 LAB — TYPE AND SCREEN
Antibody Screen: NEGATIVE
Unit division: 0

## 2012-09-14 NOTE — Telephone Encounter (Signed)
Pt's wife called concerned about pt choking when he is laying down at night and when he tries to eat.  She said he is unable to get anything up even when he coughs and "he will just choke on something".  Per AJ, okay for pt to try mucinex to see if he needs to thin out his secretions so he can cough them up.  Spelled out name of medication for wife and explained how frequently he can take medication.  She is to call if this does not help or if he has any other issues.  She verbalized understanding.  SLJ

## 2012-09-15 ENCOUNTER — Encounter (INDEPENDENT_AMBULATORY_CARE_PROVIDER_SITE_OTHER): Payer: Self-pay | Admitting: Surgery

## 2012-09-15 ENCOUNTER — Ambulatory Visit (INDEPENDENT_AMBULATORY_CARE_PROVIDER_SITE_OTHER): Payer: BC Managed Care – PPO | Admitting: Surgery

## 2012-09-15 VITALS — BP 134/72 | HR 84 | Temp 97.3°F | Resp 16 | Ht 70.0 in | Wt 164.8 lb

## 2012-09-15 DIAGNOSIS — L089 Local infection of the skin and subcutaneous tissue, unspecified: Secondary | ICD-10-CM

## 2012-09-15 DIAGNOSIS — L723 Sebaceous cyst: Secondary | ICD-10-CM

## 2012-09-15 NOTE — Progress Notes (Signed)
Subjective:     Patient ID: Derrick Crosby, male   DOB: 1951-08-26, 61 y.o.   MRN: [id number]  HPI He is here for another visit. He just saw Dr. Lindie Spruce 3 days ago and was placed on doxycycline after Dr. Lindie Spruce clean the wound. He presented back today because of persistent erythema  Review of Systems     Objective:   Physical Exam On exam, there is still erythema and induration. There was no purulence but there was necrotic tissue which I debrided with a scalpel.    Assessment:     Infected sebaceous cyst    Plan:     I will add Levaquin to his antibiotics. He will start wet-to-dry dressing changes. I will see him back in 2 weeks

## 2012-09-19 ENCOUNTER — Encounter: Payer: Self-pay | Admitting: Internal Medicine

## 2012-09-19 NOTE — Progress Notes (Signed)
Faxed disability form to [employer] @ [phone].

## 2012-09-20 ENCOUNTER — Other Ambulatory Visit (HOSPITAL_BASED_OUTPATIENT_CLINIC_OR_DEPARTMENT_OTHER): Payer: BC Managed Care – PPO

## 2012-09-20 ENCOUNTER — Ambulatory Visit (HOSPITAL_BASED_OUTPATIENT_CLINIC_OR_DEPARTMENT_OTHER): Payer: BC Managed Care – PPO | Admitting: Internal Medicine

## 2012-09-20 ENCOUNTER — Telehealth: Payer: Self-pay | Admitting: Internal Medicine

## 2012-09-20 ENCOUNTER — Other Ambulatory Visit: Payer: BC Managed Care – PPO | Admitting: Lab

## 2012-09-20 ENCOUNTER — Other Ambulatory Visit: Payer: Self-pay | Admitting: *Deleted

## 2012-09-20 ENCOUNTER — Ambulatory Visit (HOSPITAL_BASED_OUTPATIENT_CLINIC_OR_DEPARTMENT_OTHER): Payer: BC Managed Care – PPO

## 2012-09-20 VITALS — BP 107/71 | HR 74 | Temp 97.8°F | Resp 18 | Ht 70.0 in | Wt 159.8 lb

## 2012-09-20 DIAGNOSIS — C3490 Malignant neoplasm of unspecified part of unspecified bronchus or lung: Secondary | ICD-10-CM

## 2012-09-20 DIAGNOSIS — C3400 Malignant neoplasm of unspecified main bronchus: Secondary | ICD-10-CM

## 2012-09-20 DIAGNOSIS — Z5111 Encounter for antineoplastic chemotherapy: Secondary | ICD-10-CM

## 2012-09-20 LAB — CBC WITH DIFFERENTIAL/PLATELET
Basophils Absolute: 0 10*3/uL (ref 0.0–0.1)
EOS%: 0.3 % (ref 0.0–7.0)
LYMPH%: 23 % (ref 14.0–49.0)
MCH: 30.3 pg (ref 27.2–33.4)
MCV: 86.6 fL (ref 79.3–98.0)
MONO%: 9.2 % (ref 0.0–14.0)
Platelets: 95 10*3/uL — ABNORMAL LOW (ref 140–400)
RBC: 3.5 10*6/uL — ABNORMAL LOW (ref 4.20–5.82)
RDW: 16 % — ABNORMAL HIGH (ref 11.0–14.6)
nRBC: 0 % (ref 0–0)

## 2012-09-20 LAB — COMPREHENSIVE METABOLIC PANEL (CC13)
Alkaline Phosphatase: 121 U/L (ref 40–150)
CO2: 27 mEq/L (ref 22–29)
Creatinine: 1.1 mg/dL (ref 0.7–1.3)
Glucose: 91 mg/dl (ref 70–99)
Sodium: 135 mEq/L — ABNORMAL LOW (ref 136–145)
Total Bilirubin: 0.67 mg/dL (ref 0.20–1.20)
Total Protein: 6.1 g/dL — ABNORMAL LOW (ref 6.4–8.3)

## 2012-09-20 MED ORDER — SODIUM CHLORIDE 0.9 % IJ SOLN
10.0000 mL | INTRAMUSCULAR | Status: DC | PRN
  Administered 2012-09-20: 10 mL
  Filled 2012-09-20: qty 10

## 2012-09-20 MED ORDER — ONDANSETRON 16 MG/50ML IVPB (CHCC)
16.0000 mg | Freq: Once | INTRAVENOUS | Status: AC
  Administered 2012-09-20: 16 mg via INTRAVENOUS

## 2012-09-20 MED ORDER — SODIUM CHLORIDE 0.9 % IV SOLN
Freq: Once | INTRAVENOUS | Status: AC
  Administered 2012-09-20: 11:00:00 via INTRAVENOUS

## 2012-09-20 MED ORDER — SODIUM CHLORIDE 0.9 % IV SOLN
510.0000 mg | Freq: Once | INTRAVENOUS | Status: AC
  Administered 2012-09-20: 510 mg via INTRAVENOUS
  Filled 2012-09-20: qty 51

## 2012-09-20 MED ORDER — HEPARIN SOD (PORK) LOCK FLUSH 100 UNIT/ML IV SOLN
500.0000 [IU] | Freq: Once | INTRAVENOUS | Status: AC | PRN
  Administered 2012-09-20: 500 [IU]
  Filled 2012-09-20: qty 5

## 2012-09-20 MED ORDER — LORAZEPAM 0.5 MG PO TABS: ORAL_TABLET | ORAL | Status: DC

## 2012-09-20 MED ORDER — DEXAMETHASONE SODIUM PHOSPHATE 4 MG/ML IJ SOLN
20.0000 mg | Freq: Once | INTRAMUSCULAR | Status: AC
  Administered 2012-09-20: 20 mg via INTRAVENOUS

## 2012-09-20 MED ORDER — PACLITAXEL PROTEIN-BOUND CHEMO INJECTION 100 MG
90.0000 mg/m2 | Freq: Once | INTRAVENOUS | Status: AC
  Administered 2012-09-20: 175 mg via INTRAVENOUS
  Filled 2012-09-20: qty 35

## 2012-09-20 NOTE — Patient Instructions (Addendum)
[facility] Discharge Instructions for Patients Receiving Chemotherapy  Today you received the following chemotherapy agents Abraxane/Carboplatin.  To help prevent nausea and vomiting after your treatment, we encourage you to take your nausea medication as prescribed by Dr Arbutus Ped.  Use Zofran and Compazine, we started and called in Lorazepam today to add extra nausea medicine. Should use this more in the evening, be cautious, it can cause drowsiness.   If you develop nausea and vomiting that is not controlled by your nausea medication, call the clinic. If it is after clinic hours your family physician or the after hours number for the clinic or go to the Emergency Department.   BELOW ARE SYMPTOMS THAT SHOULD BE REPORTED IMMEDIATELY:  *FEVER GREATER THAN 100.5 F  *CHILLS WITH OR WITHOUT FEVER  NAUSEA AND VOMITING THAT IS NOT CONTROLLED WITH YOUR NAUSEA MEDICATION  *UNUSUAL SHORTNESS OF BREATH  *UNUSUAL BRUISING OR BLEEDING  TENDERNESS IN MOUTH AND THROAT WITH OR WITHOUT PRESENCE OF ULCERS  *URINARY PROBLEMS  *BOWEL PROBLEMS  UNUSUAL RASH Items with * indicate a potential emergency and should be followed up as soon as possible.  Feel free to call the clinic you have any questions or concerns. The clinic phone number is [phone].

## 2012-09-20 NOTE — Patient Instructions (Signed)
Your scan showed significant improvement in her disease. Continue chemotherapy with the same regimen. Followup in 3 weeks

## 2012-09-20 NOTE — Telephone Encounter (Signed)
gv and printed pt appt schedule for Nov and Dec 2013 for lab, est, chemo

## 2012-09-20 NOTE — Progress Notes (Signed)
[facility] [phone]   [phone]  OFFICE PROGRESS NOTE  [name], PA [facility] [address] [place] [address]  DIAGNOSIS: Metastatic non-small cell lung cancer, squamous cell carcinoma diagnosed in August of 2013.   PRIOR THERAPY: status post palliative radiotherapy to the left hilar mass under the care of Dr. Mitzi Hansen.   CURRENT THERAPY:  1) Systemic chemotherapy with carboplatin for AUC of 5 on day 1 and Abraxane 100 mg/M2 on days 1, 8 and 15 every 3 weeks. The patient is status post 3 cycles.  INTERVAL HISTORY: Derrick Crosby 61 y.o. male returns to the clinic today for followup visit accompanied his wife and his sister-in-law. The patient tolerated the last cycle of his systemic chemotherapy fairly well except for 1 or 2 days of dry heaves. He denied having any significant vomiting. He has no chest pain, shortness of breath, cough or hemoptysis. He denied having any significant weight loss or night sweats. No peripheral neuropathy. The patient has repeat CT scan of the chest, abdomen and pelvis performed recently and he is here today for evaluation and discussion of his scan results.  MEDICAL HISTORY: Past Medical History  Diagnosis Date  . Hypertension   . Hyperglyceridemia, pure   . COPD (chronic obstructive pulmonary disease)   . Cancer     Skin  Cancer - Left ear.-  . Hemoptysis   . Skin cancer     left ear  . Anxiety   . Depression   . Lung cancer 06/13/12    lung mass /right hilar suspicious for primary bronchogenic ca  . Hemoptysis   . Seasonal allergies   . History of radiation therapy 07/05/12-07/26/12    lung ca     ALLERGIES:   has no known allergies.  MEDICATIONS:  Current Outpatient Prescriptions  Medication Sig Dispense Refill  . acetaminophen (TYLENOL) 325 MG tablet Take 650 mg by mouth every 6 (six) hours as needed. For general aches      . albuterol (PROVENTIL HFA;VENTOLIN HFA) 108 (90  BASE) MCG/ACT inhaler Inhale 2 puffs into the lungs every 6 (six) hours as needed. wheezing      . aspirin EC 81 MG tablet Take 81 mg by mouth daily.      . citalopram (CELEXA) 20 MG tablet Take 20 mg by mouth every evening.       . clonazePAM (KLONOPIN) 0.5 MG tablet Take 0.5 mg by mouth 2 (two) times daily as needed. For anxiety      . doxycycline (VIBRA-TABS) 100 MG tablet Take 1 tablet (100 mg total) by mouth 2 (two) times daily.  28 tablet  0  . fish oil-omega-3 fatty acids 1000 MG capsule Take 1 g by mouth daily.       Marland Kitchen HYDROcodone-acetaminophen (NORCO/VICODIN) 5-325 MG per tablet Take 1 tablet by mouth every 4 (four) hours as needed.  30 tablet  0  . HYDROcodone-homatropine (HYCODAN) 5-1.5 MG/5ML syrup Take 5 mLs by mouth every 6 (six) hours as needed for cough.  240 mL  0  . lidocaine-prilocaine (EMLA) cream Apply 1 application topically as needed. Apply to [facility] A cath site 30-60 min before chemotherapy. Used every Wednesday      . loratadine (CLARITIN) 10 MG tablet Take 10 mg by mouth daily.      . Multiple Vitamins-Minerals (MULTIVITAMIN WITH MINERALS) tablet Take 1 tablet by mouth daily.      . ondansetron (ZOFRAN) 8 MG tablet  TAKE 1 TABLET BY MOUTH EVERY 8 HOURS AS NEEDED FOR NAUSEA AND VOMITING  20 tablet  2  . PACLitaxel-protein bound (ABRAXANE) 100 MG injection Inject 175 mg into the vein once.       . polyethylene glycol powder (GLYCOLAX/MIRALAX) powder Take 17 g by mouth daily.  255 g  0  . prochlorperazine (COMPAZINE) 10 MG tablet Take 10 mg by mouth every 6 (six) hours as needed.      . sucralfate (CARAFATE) 1 G tablet Take 1 tablet (1 g total) by mouth 4 (four) times daily. Dissolve in 15 cc water.  120 tablet  2  . triamcinolone cream (KENALOG) 0.1 % Apply topically 2 (two) times daily.        SURGICAL HISTORY:  Past Surgical History  Procedure Date  . Tympanoplasty   . Video bronchoscopy 06/13/12    with endobronchial u/s/ with biospy left hilar mass/Dr. Lonia Farber  . Tonsillectomy     child    REVIEW OF SYSTEMS:  A comprehensive review of systems was negative except for: Constitutional: positive for fatigue Gastrointestinal: positive for nausea   PHYSICAL EXAMINATION: General appearance: alert, cooperative and no distress Head: Normocephalic, without obvious abnormality, atraumatic Neck: no adenopathy Lymph nodes: Cervical, supraclavicular, and axillary nodes normal. Resp: clear to auscultation bilaterally Cardio: regular rate and rhythm, S1, S2 normal, no murmur, click, rub or gallop GI: soft, non-tender; bowel sounds normal; no masses,  no organomegaly Extremities: extremities normal, atraumatic, no cyanosis or edema Neurologic: Alert and oriented X 3, normal strength and tone. Normal symmetric reflexes. Normal coordination and gait  ECOG PERFORMANCE STATUS: 1 - Symptomatic but completely ambulatory  There were no vitals taken for this visit.  LABORATORY DATA: Lab Results  Component Value Date   WBC 3.8* 09/20/2012   HGB 10.6* 09/20/2012   HCT 30.3* 09/20/2012   MCV 86.6 09/20/2012   PLT 95* 09/20/2012      Chemistry      Component Value Date/Time   NA 134* 09/13/2012 1043   NA 137 09/04/2012 2100   K 3.6 09/13/2012 1043   K 3.9 09/04/2012 2100   CL 99 09/13/2012 1043   CL 102 09/04/2012 2100   CO2 26 09/13/2012 1043   CO2 27 08/24/2012 1627   BUN 16.0 09/13/2012 1043   BUN 24* 09/04/2012 2100   CREATININE 1.0 09/13/2012 1043   CREATININE 1.20 09/04/2012 2100      Component Value Date/Time   CALCIUM 8.9 09/13/2012 1043   CALCIUM 9.2 08/24/2012 1627   ALKPHOS 110 09/13/2012 1043   ALKPHOS 95 08/24/2012 1627   AST 23 09/13/2012 1043   AST 25 08/24/2012 1627   ALT 40 09/13/2012 1043   ALT 41 08/24/2012 1627   BILITOT 0.64 09/13/2012 1043   BILITOT 0.6 08/24/2012 1627       RADIOGRAPHIC STUDIES: Dg Chest 2 View  08/24/2012  *RADIOLOGY REPORT*  Clinical Data: Chest pain.  Hypertension.  Emesis.  CHEST - 2 VIEW   Comparison: Multiple exams, including 06/02/2012  Findings: Right internal jugular power port catheter tip projects over the SVC.  Heart size within normal limits. Prior left central hilar mass is dramatically reduced in size compared to prior PET CT and prior conventional radiographs.  No pleural effusion observed.  IMPRESSION:  1.  The left central hilar mass is dramatically reduced conspicuity compared to prior exam from July. 2.  Right power port tip projects over the SVC.   Original Report Authenticated By: Zollie Beckers  D. Ova Freshwater, M.D.    Ct Chest W Contrast  09/13/2012  *RADIOLOGY REPORT*  Clinical Data:  Lung cancer diagnosed 4 months ago.  Ongoing chemotherapy.  Radiation therapy completed.  The patient complains of chest pain and shortness of breath.  CT CHEST, ABDOMEN AND PELVIS WITH CONTRAST  Technique:  Multidetector CT imaging of the chest, abdomen and pelvis was performed following the standard protocol during bolus administration of intravenous contrast.  Contrast: OMNIPAQUE IOHEXOL 300 MG/ML  SOLN  Comparison:  PET CT 06/16/2012.  Chest CT 06/06/2012.  CT CHEST  Findings:  The previously demonstrated dominant mass posterior to the left hilum has significantly decreased in size.  This now measures 1.4 x 2.2 cm transverse on image 33 (previously 5.8 x 4.9 cm).  There is no longer any residual mass effect on the tracheobronchial tree or pulmonary vasculature.  Apart from this lesion, there are no enlarged mediastinal or hilar lymph nodes. The hypermetabolic node anterior to the left common carotid artery has resolved.  The small node in the AP window which was not hypermetabolic on PET CT has decreased in size.  There is no pleural or pericardial effusion.  No peripheral lung mass or endobronchial lesion is seen.  Moderate emphysematous changes are present throughout the lungs.  There are no suspicious osseous findings.  IMPRESSION:  1.  Marked improvement in previously demonstrated dominant left  retro hilar mass and associated mediastinal lymphadenopathy. 2.  No significant residual postobstructive pneumonitis or focal pulmonary abnormality identified.  Diffuse emphysema noted.  CT ABDOMEN AND PELVIS  Findings:  Scattered hepatic cysts are unchanged.  There are no new or suspicious liver lesions.  The gallbladder, biliary system and pancreas appear normal.  There were small bilateral adrenal nodules on the prior CT which were hypermetabolic on PET CT.  Both adrenal glands now appear normal without residual nodularity.  The spleen appears normal.  Tiny renal calculi are present bilaterally.  There are no enlarged abdominal pelvic lymph nodes. No bowel abnormalities are seen.  The appendix appears normal.  The urinary bladder, prostate gland and seminal vesicles appear normal.  There are no acute or suspicious osseous findings.  IMPRESSION:  1.  Resolved bilateral adrenal nodules, hypermetabolic on PET CT and consistent with metastatic disease. 2.  No evidence of residual abdominal pelvic metastatic disease. 3.  Stable hepatic cysts and bilateral renal calculi.   Original Report Authenticated By: Carey Bullocks, M.D.    Ct Abdomen Pelvis W Contrast  09/13/2012  *RADIOLOGY REPORT*  Clinical Data:  Lung cancer diagnosed 4 months ago.  Ongoing chemotherapy.  Radiation therapy completed.  The patient complains of chest pain and shortness of breath.  CT CHEST, ABDOMEN AND PELVIS WITH CONTRAST  Technique:  Multidetector CT imaging of the chest, abdomen and pelvis was performed following the standard protocol during bolus administration of intravenous contrast.  Contrast: OMNIPAQUE IOHEXOL 300 MG/ML  SOLN  Comparison:  PET CT 06/16/2012.  Chest CT 06/06/2012.  CT CHEST  Findings:  The previously demonstrated dominant mass posterior to the left hilum has significantly decreased in size.  This now measures 1.4 x 2.2 cm transverse on image 33 (previously 5.8 x 4.9 cm).  There is no longer any residual mass  effect on the tracheobronchial tree or pulmonary vasculature.  Apart from this lesion, there are no enlarged mediastinal or hilar lymph nodes. The hypermetabolic node anterior to the left common carotid artery has resolved.  The small node in the AP window which  was not hypermetabolic on PET CT has decreased in size.  There is no pleural or pericardial effusion.  No peripheral lung mass or endobronchial lesion is seen.  Moderate emphysematous changes are present throughout the lungs.  There are no suspicious osseous findings.  IMPRESSION:  1.  Marked improvement in previously demonstrated dominant left retro hilar mass and associated mediastinal lymphadenopathy. 2.  No significant residual postobstructive pneumonitis or focal pulmonary abnormality identified.  Diffuse emphysema noted.  CT ABDOMEN AND PELVIS  Findings:  Scattered hepatic cysts are unchanged.  There are no new or suspicious liver lesions.  The gallbladder, biliary system and pancreas appear normal.  There were small bilateral adrenal nodules on the prior CT which were hypermetabolic on PET CT.  Both adrenal glands now appear normal without residual nodularity.  The spleen appears normal.  Tiny renal calculi are present bilaterally.  There are no enlarged abdominal pelvic lymph nodes. No bowel abnormalities are seen.  The appendix appears normal.  The urinary bladder, prostate gland and seminal vesicles appear normal.  There are no acute or suspicious osseous findings.  IMPRESSION:  1.  Resolved bilateral adrenal nodules, hypermetabolic on PET CT and consistent with metastatic disease. 2.  No evidence of residual abdominal pelvic metastatic disease. 3.  Stable hepatic cysts and bilateral renal calculi.   Original Report Authenticated By: Carey Bullocks, M.D.    Dg Abd Acute W/chest  09/04/2012  *RADIOLOGY REPORT*  Clinical Data: Constipation and abdominal pain.  History of lung carcinoma.  ACUTE ABDOMEN SERIES (ABDOMEN 2 VIEW & CHEST 1 VIEW)   Comparison: Chest x-ray dated 08/24/2012  Findings: The lungs are clear.  Port-A-Cath positioning stable. Abdominal films show no evidence of acute bowel obstruction or free intraperitoneal air.  No evidence of fecal impaction.  No abnormal calcifications.  Mild degenerative changes are present in the lower lumbar spine.  IMPRESSION: No acute findings.   Original Report Authenticated By: Reola Calkins, M.D.     ASSESSMENT: This is a very pleasant 61 years old white male with metastatic non-small cell lung cancer, squamous cell carcinoma status post palliative radiotherapy to the left hilar mass followed by 3 cycles of systemic chemotherapy with carboplatin and Abraxane. The patient is tolerating his treatment fairly well and he has significant improvement in his disease.  PLAN: I discussed the scan results with the patient and his family and showed them the images. I recommended for him to continue on the same systemic chemotherapy with carboplatin and Abraxane. The patient was started cycle #4 today. He would come back for followup visit in 3 weeks with the start of cycle #5. He was advised to call me immediately if he has any concerning symptoms in the interval. He was advised to take Compazine for nausea on as-needed basis.  All questions were answered. The patient knows to call the clinic with any problems, questions or concerns. We can certainly see the patient much sooner if necessary.  I spent 15 minutes counseling the patient face to face. The total time spent in the appointment was 25 minutes.

## 2012-09-22 ENCOUNTER — Other Ambulatory Visit: Payer: Self-pay | Admitting: Medical Oncology

## 2012-09-22 DIAGNOSIS — C3400 Malignant neoplasm of unspecified main bronchus: Secondary | ICD-10-CM

## 2012-09-22 MED ORDER — HYDROCODONE-HOMATROPINE 5-1.5 MG/5ML PO SYRP: 5.0000 mL | ORAL_SOLUTION | Freq: Four times a day (QID) | ORAL | Status: DC | PRN

## 2012-09-22 NOTE — Telephone Encounter (Signed)
Wife calls to request refill for cough syrup-sent to [facility] for authorization.

## 2012-09-22 NOTE — Telephone Encounter (Signed)
Left message to pick up refill. 

## 2012-09-26 ENCOUNTER — Ambulatory Visit (INDEPENDENT_AMBULATORY_CARE_PROVIDER_SITE_OTHER): Payer: BC Managed Care – PPO | Admitting: Surgery

## 2012-09-26 ENCOUNTER — Encounter (INDEPENDENT_AMBULATORY_CARE_PROVIDER_SITE_OTHER): Payer: Self-pay | Admitting: Surgery

## 2012-09-26 VITALS — BP 110/68 | HR 92 | Temp 97.8°F | Ht 70.0 in | Wt 156.4 lb

## 2012-09-26 DIAGNOSIS — L723 Sebaceous cyst: Secondary | ICD-10-CM

## 2012-09-26 NOTE — Progress Notes (Signed)
Subjective:     Patient ID: Derrick Crosby, male   DOB: 09-21-1951, 61 y.o.   MRN: [id number]  HPI He is here for a recheck of the chronically infected sebaceous cyst. He has no complaints. He reports minimal drainage. He has finished a course of antibiotics.  Review of Systems     Objective:   Physical Exam On exam, the wound is clean. There is minimal erythema induration    Assessment:     Chronically infected sebaceous cyst status post incision and drainage    Plan:     I will continue antibiotics for 10 more days given he is still undergoing therapy for his lung cancer. I will see him back as needed

## 2012-09-27 ENCOUNTER — Other Ambulatory Visit (HOSPITAL_BASED_OUTPATIENT_CLINIC_OR_DEPARTMENT_OTHER): Payer: BC Managed Care – PPO | Admitting: Lab

## 2012-09-27 ENCOUNTER — Ambulatory Visit (HOSPITAL_BASED_OUTPATIENT_CLINIC_OR_DEPARTMENT_OTHER): Payer: BC Managed Care – PPO

## 2012-09-27 VITALS — BP 94/63 | HR 73 | Temp 98.1°F | Resp 18

## 2012-09-27 DIAGNOSIS — C3490 Malignant neoplasm of unspecified part of unspecified bronchus or lung: Secondary | ICD-10-CM

## 2012-09-27 DIAGNOSIS — Z5111 Encounter for antineoplastic chemotherapy: Secondary | ICD-10-CM

## 2012-09-27 DIAGNOSIS — C3400 Malignant neoplasm of unspecified main bronchus: Secondary | ICD-10-CM

## 2012-09-27 LAB — COMPREHENSIVE METABOLIC PANEL (CC13)
BUN: 17 mg/dL (ref 7.0–26.0)
CO2: 29 mEq/L (ref 22–29)
Glucose: 88 mg/dl (ref 70–99)
Sodium: 136 mEq/L (ref 136–145)
Total Bilirubin: 0.78 mg/dL (ref 0.20–1.20)
Total Protein: 6 g/dL — ABNORMAL LOW (ref 6.4–8.3)

## 2012-09-27 LAB — CBC WITH DIFFERENTIAL/PLATELET
Basophils Absolute: 0 10*3/uL (ref 0.0–0.1)
EOS%: 0 % (ref 0.0–7.0)
Eosinophils Absolute: 0 10*3/uL (ref 0.0–0.5)
HCT: 27.9 % — ABNORMAL LOW (ref 38.4–49.9)
HGB: 9.7 g/dL — ABNORMAL LOW (ref 13.0–17.1)
MONO#: 0.3 10*3/uL (ref 0.1–0.9)
NEUT#: 1.7 10*3/uL (ref 1.5–6.5)
RDW: 15.9 % — ABNORMAL HIGH (ref 11.0–14.6)
WBC: 2.7 10*3/uL — ABNORMAL LOW (ref 4.0–10.3)
lymph#: 0.8 10*3/uL — ABNORMAL LOW (ref 0.9–3.3)

## 2012-09-27 MED ORDER — SODIUM CHLORIDE 0.9 % IJ SOLN
10.0000 mL | INTRAMUSCULAR | Status: DC | PRN
  Administered 2012-09-27: 10 mL
  Filled 2012-09-27: qty 10

## 2012-09-27 MED ORDER — SODIUM CHLORIDE 0.9 % IV SOLN
Freq: Once | INTRAVENOUS | Status: AC
  Administered 2012-09-27: 11:00:00 via INTRAVENOUS

## 2012-09-27 MED ORDER — HEPARIN SOD (PORK) LOCK FLUSH 100 UNIT/ML IV SOLN
500.0000 [IU] | Freq: Once | INTRAVENOUS | Status: AC | PRN
  Administered 2012-09-27: 500 [IU]
  Filled 2012-09-27: qty 5

## 2012-09-27 MED ORDER — PACLITAXEL PROTEIN-BOUND CHEMO INJECTION 100 MG
90.0000 mg/m2 | Freq: Once | INTRAVENOUS | Status: AC
  Administered 2012-09-27: 175 mg via INTRAVENOUS
  Filled 2012-09-27: qty 35

## 2012-09-27 MED ORDER — DEXAMETHASONE SODIUM PHOSPHATE 10 MG/ML IJ SOLN
10.0000 mg | Freq: Once | INTRAMUSCULAR | Status: AC
  Administered 2012-09-27: 10 mg via INTRAVENOUS

## 2012-09-27 MED ORDER — ONDANSETRON 8 MG/50ML IVPB (CHCC)
8.0000 mg | Freq: Once | INTRAVENOUS | Status: AC
  Administered 2012-09-27: 8 mg via INTRAVENOUS

## 2012-09-27 NOTE — Patient Instructions (Addendum)
[facility] Discharge Instructions for Patients Receiving Chemotherapy  Today you received the following chemotherapy agents Abraxane To help prevent nausea and vomiting after your treatment, we encourage you to take your nausea medication as prescribed. If you develop nausea and vomiting that is not controlled by your nausea medication, call the clinic. If it is after clinic hours your family physician or the after hours number for the clinic or go to the Emergency Department.   BELOW ARE SYMPTOMS THAT SHOULD BE REPORTED IMMEDIATELY:  *FEVER GREATER THAN 100.5 F  *CHILLS WITH OR WITHOUT FEVER  NAUSEA AND VOMITING THAT IS NOT CONTROLLED WITH YOUR NAUSEA MEDICATION  *UNUSUAL SHORTNESS OF BREATH  *UNUSUAL BRUISING OR BLEEDING  TENDERNESS IN MOUTH AND THROAT WITH OR WITHOUT PRESENCE OF ULCERS  *URINARY PROBLEMS  *BOWEL PROBLEMS  UNUSUAL RASH Items with * indicate a potential emergency and should be followed up as soon as possible.  One of the nurses will contact you 24 hours after your treatment. Please let the nurse know about any problems that you may have experienced. Feel free to call the clinic you have any questions or concerns. The clinic phone number is [phone].   I have been informed and understand all the instructions given to me. I know to contact the clinic, my physician, or go to the Emergency Department if any problems should occur. I do not have any questions at this time, but understand that I may call the clinic during office hours   should I have any questions or need assistance in obtaining follow up care.    __________________________________________  _____________  __________ Signature of Patient or Authorized Representative            Date                   Time    __________________________________________ Nurse's Signature    

## 2012-09-27 NOTE — Progress Notes (Signed)
Ok to treat per Adrena. 

## 2012-10-04 ENCOUNTER — Ambulatory Visit (HOSPITAL_BASED_OUTPATIENT_CLINIC_OR_DEPARTMENT_OTHER): Payer: BC Managed Care – PPO

## 2012-10-04 ENCOUNTER — Other Ambulatory Visit: Payer: Self-pay | Admitting: Internal Medicine

## 2012-10-04 ENCOUNTER — Other Ambulatory Visit (HOSPITAL_BASED_OUTPATIENT_CLINIC_OR_DEPARTMENT_OTHER): Payer: BC Managed Care – PPO | Admitting: Lab

## 2012-10-04 DIAGNOSIS — C3490 Malignant neoplasm of unspecified part of unspecified bronchus or lung: Secondary | ICD-10-CM

## 2012-10-04 DIAGNOSIS — C3400 Malignant neoplasm of unspecified main bronchus: Secondary | ICD-10-CM

## 2012-10-04 DIAGNOSIS — C3410 Malignant neoplasm of upper lobe, unspecified bronchus or lung: Secondary | ICD-10-CM

## 2012-10-04 LAB — CBC WITH DIFFERENTIAL/PLATELET
BASO%: 0 % (ref 0.0–2.0)
Basophils Absolute: 0 10e3/uL (ref 0.0–0.1)
EOS%: 0.9 % (ref 0.0–7.0)
Eosinophils Absolute: 0 10e3/uL (ref 0.0–0.5)
HCT: 25.6 % — ABNORMAL LOW (ref 38.4–49.9)
HGB: 9 g/dL — ABNORMAL LOW (ref 13.0–17.1)
LYMPH%: 41.3 % (ref 14.0–49.0)
MCH: 30.8 pg (ref 27.2–33.4)
MCHC: 35.2 g/dL (ref 32.0–36.0)
MCV: 87.7 fL (ref 79.3–98.0)
MONO#: 0.4 10e3/uL (ref 0.1–0.9)
MONO%: 16.4 % — ABNORMAL HIGH (ref 0.0–14.0)
NEUT#: 0.9 10e3/uL — ABNORMAL LOW (ref 1.5–6.5)
NEUT%: 41.4 % (ref 39.0–75.0)
Platelets: 108 10e3/uL — ABNORMAL LOW (ref 140–400)
RBC: 2.92 10e6/uL — ABNORMAL LOW (ref 4.20–5.82)
RDW: 16.6 % — ABNORMAL HIGH (ref 11.0–14.6)
WBC: 2.1 10e3/uL — ABNORMAL LOW (ref 4.0–10.3)
lymph#: 0.9 10e3/uL (ref 0.9–3.3)

## 2012-10-04 NOTE — Progress Notes (Signed)
Pt increased fatigue. Labs below normal. Per Dr Arbutus Ped cancel tx and have pt return for next weeks scheduled appt. Discussed several questions from wife about neutropenia.

## 2012-10-09 ENCOUNTER — Telehealth (INDEPENDENT_AMBULATORY_CARE_PROVIDER_SITE_OTHER): Payer: Self-pay | Admitting: General Surgery

## 2012-10-09 ENCOUNTER — Other Ambulatory Visit: Payer: Self-pay | Admitting: Internal Medicine

## 2012-10-09 DIAGNOSIS — C3400 Malignant neoplasm of unspecified main bronchus: Secondary | ICD-10-CM

## 2012-10-09 DIAGNOSIS — C3490 Malignant neoplasm of unspecified part of unspecified bronchus or lung: Secondary | ICD-10-CM

## 2012-10-09 NOTE — Telephone Encounter (Signed)
Wife called to report husband has completed his antibiotics and the area is not yet healed completely.  There is some redness remaining, but not puffiness, fever or any other signs of infection.  Instructed wife to call back if any symptoms of infection develop.  She understands.

## 2012-10-10 ENCOUNTER — Encounter (INDEPENDENT_AMBULATORY_CARE_PROVIDER_SITE_OTHER): Payer: BC Managed Care – PPO | Admitting: General Surgery

## 2012-10-11 ENCOUNTER — Other Ambulatory Visit: Payer: BC Managed Care – PPO | Admitting: Lab

## 2012-10-11 ENCOUNTER — Ambulatory Visit: Payer: BC Managed Care – PPO

## 2012-10-11 ENCOUNTER — Other Ambulatory Visit: Payer: BC Managed Care – PPO

## 2012-10-11 ENCOUNTER — Other Ambulatory Visit: Payer: Self-pay | Admitting: Medical Oncology

## 2012-10-11 ENCOUNTER — Telehealth: Payer: Self-pay | Admitting: *Deleted

## 2012-10-11 ENCOUNTER — Ambulatory Visit (HOSPITAL_BASED_OUTPATIENT_CLINIC_OR_DEPARTMENT_OTHER): Payer: BC Managed Care – PPO | Admitting: Internal Medicine

## 2012-10-11 VITALS — BP 98/64 | HR 76 | Temp 99.1°F | Resp 18 | Ht 70.0 in | Wt 160.7 lb

## 2012-10-11 DIAGNOSIS — C3400 Malignant neoplasm of unspecified main bronchus: Secondary | ICD-10-CM

## 2012-10-11 DIAGNOSIS — C3490 Malignant neoplasm of unspecified part of unspecified bronchus or lung: Secondary | ICD-10-CM

## 2012-10-11 LAB — CBC WITH DIFFERENTIAL/PLATELET
Eosinophils Absolute: 0 10*3/uL (ref 0.0–0.5)
HGB: 8.9 g/dL — ABNORMAL LOW (ref 13.0–17.1)
MONO#: 0.4 10*3/uL (ref 0.1–0.9)
NEUT#: 1.8 10*3/uL (ref 1.5–6.5)
Platelets: 64 10*3/uL — ABNORMAL LOW (ref 140–400)
RBC: 2.85 10*6/uL — ABNORMAL LOW (ref 4.20–5.82)
RDW: 19.6 % — ABNORMAL HIGH (ref 11.0–14.6)
WBC: 3.4 10*3/uL — ABNORMAL LOW (ref 4.0–10.3)
nRBC: 0 % (ref 0–0)

## 2012-10-11 MED ORDER — HYDROCODONE-HOMATROPINE 5-1.5 MG/5ML PO SYRP: 5.0000 mL | ORAL_SOLUTION | Freq: Four times a day (QID) | ORAL | Status: DC | PRN

## 2012-10-11 NOTE — Telephone Encounter (Signed)
Per staff message and POF I have scheduled appts.  JMW  

## 2012-10-11 NOTE — Progress Notes (Signed)
[facility] [phone]   [phone]  OFFICE PROGRESS NOTE  [name], PA [facility] [address] [place] [address]  DIAGNOSIS: Metastatic non-small cell lung cancer, squamous cell carcinoma diagnosed in August of 2013.   PRIOR THERAPY: status post palliative radiotherapy to the left hilar mass under the care of Dr. Mitzi Hansen.   CURRENT THERAPY:  Systemic chemotherapy with carboplatin for AUC of 5 on day 1 and Abraxane 100 mg/M2 on days 1, 8 and 15 every 3 weeks. The patient is status post 4 cycles.  INTERVAL HISTORY: Derrick Crosby 61 y.o. male returns to the clinic today for followup visit accompanied his wife and sister-in-law. The patient is feeling fine today with no specific complaints except for occasional mild epistaxis. He denied having any significant chest pain or shortness of breath but he continues to have mild dry cough, no hemoptysis. The patient denied having any significant weight loss or night sweats. He missed day 15 of cycle #4 of his chemotherapy secondary to neutropenia. The patient has repeat CBC performed earlier today and he is here for starting cycle #5 of chemotherapy.  MEDICAL HISTORY: Past Medical History  Diagnosis Date  . Hypertension   . Hyperglyceridemia, pure   . COPD (chronic obstructive pulmonary disease)   . Cancer     Skin  Cancer - Left ear.-  . Hemoptysis   . Skin cancer     left ear  . Anxiety   . Depression   . Lung cancer 06/13/12    lung mass /right hilar suspicious for primary bronchogenic ca  . Hemoptysis   . Seasonal allergies   . History of radiation therapy 07/05/12-07/26/12    lung ca     ALLERGIES:   has no known allergies.  MEDICATIONS:  Current Outpatient Prescriptions  Medication Sig Dispense Refill  . acetaminophen (TYLENOL) 325 MG tablet Take 650 mg by mouth every 6 (six) hours as needed. For general aches      . albuterol (PROVENTIL HFA;VENTOLIN HFA) 108 (90  BASE) MCG/ACT inhaler Inhale 2 puffs into the lungs every 6 (six) hours as needed. wheezing      . aspirin EC 81 MG tablet Take 81 mg by mouth daily.      . citalopram (CELEXA) 20 MG tablet Take 20 mg by mouth every evening.       . clonazePAM (KLONOPIN) 0.5 MG tablet Take 0.5 mg by mouth 2 (two) times daily as needed. For anxiety      . fish oil-omega-3 fatty acids 1000 MG capsule Take 1 g by mouth daily.       Marland Kitchen guaiFENesin (MUCINEX) 600 MG 12 hr tablet Take 1,200 mg by mouth 2 (two) times daily.      Marland Kitchen HYDROcodone-homatropine (HYCODAN) 5-1.5 MG/5ML syrup Take 5 mLs by mouth every 6 (six) hours as needed for cough.  240 mL  0  . lidocaine-prilocaine (EMLA) cream Apply 1 application topically as needed. Apply to [facility] A cath site 30-60 min before chemotherapy. Used every Wednesday      . loratadine (CLARITIN) 10 MG tablet Take 10 mg by mouth daily.      Marland Kitchen LORazepam (ATIVAN) 0.5 MG tablet TAKE 1/2-1 TABLET BY MOUTH EVERY 8 HOURS AS NEEDED FOR NAUSEA  30 tablet  0  . Multiple Vitamins-Minerals (MULTIVITAMIN WITH MINERALS) tablet Take 1 tablet by mouth daily.      . ondansetron (ZOFRAN) 8 MG tablet TAKE 1 TAB BY  MOUTH EVERY 8 HRS AS NEEDED FOR NAUSEA *FILL 09/11/12 INS LIMITS 21TABS PER 30DAYS *  21 tablet  0  . PACLitaxel-protein bound (ABRAXANE) 100 MG injection Inject 175 mg into the vein once.       . polyethylene glycol powder (GLYCOLAX/MIRALAX) powder Take 17 g by mouth daily.  255 g  0  . sucralfate (CARAFATE) 1 G tablet Take 1 tablet (1 g total) by mouth 4 (four) times daily. Dissolve in 15 cc water.  120 tablet  2  . triamcinolone cream (KENALOG) 0.1 % Apply topically 2 (two) times daily.      . prochlorperazine (COMPAZINE) 10 MG tablet Take 10 mg by mouth every 6 (six) hours as needed.        SURGICAL HISTORY:  Past Surgical History  Procedure Date  . Tympanoplasty   . Video bronchoscopy 06/13/12    with endobronchial u/s/ with biospy left hilar mass/Dr. Lonia Farber  .  Tonsillectomy     child    REVIEW OF SYSTEMS:  A comprehensive review of systems was negative except for: Constitutional: positive for fatigue Respiratory: positive for cough   PHYSICAL EXAMINATION: General appearance: alert, cooperative and no distress Head: Normocephalic, without obvious abnormality, atraumatic Neck: no adenopathy Lymph nodes: Cervical, supraclavicular, and axillary nodes normal. Resp: clear to auscultation bilaterally Cardio: regular rate and rhythm, S1, S2 normal, no murmur, click, rub or gallop GI: soft, non-tender; bowel sounds normal; no masses,  no organomegaly Extremities: extremities normal, atraumatic, no cyanosis or edema Neurologic: Alert and oriented X 3, normal strength and tone. Normal symmetric reflexes. Normal coordination and gait  ECOG PERFORMANCE STATUS: 1 - Symptomatic but completely ambulatory  Blood pressure 98/64, pulse 76, temperature 99.1 F (37.3 C), temperature source Oral, resp. rate 18, height 5\' 10"  (1.778 m), weight 160 lb 11.2 oz (72.893 kg).  LABORATORY DATA: Lab Results  Component Value Date   WBC 3.4* 10/11/2012   HGB 8.9* 10/11/2012   HCT 26.3* 10/11/2012   MCV 92.3 10/11/2012   PLT 64* 10/11/2012      Chemistry      Component Value Date/Time   NA 136 09/27/2012 1051   NA 137 09/04/2012 2100   K 4.2 09/27/2012 1051   K 3.9 09/04/2012 2100   CL 102 09/27/2012 1051   CL 102 09/04/2012 2100   CO2 29 09/27/2012 1051   CO2 27 08/24/2012 1627   BUN 17.0 09/27/2012 1051   BUN 24* 09/04/2012 2100   CREATININE 1.0 09/27/2012 1051   CREATININE 1.20 09/04/2012 2100      Component Value Date/Time   CALCIUM 9.2 09/27/2012 1051   CALCIUM 9.2 08/24/2012 1627   ALKPHOS 111 09/27/2012 1051   ALKPHOS 95 08/24/2012 1627   AST 19 09/27/2012 1051   AST 25 08/24/2012 1627   ALT 21 09/27/2012 1051   ALT 41 08/24/2012 1627   BILITOT 0.78 09/27/2012 1051   BILITOT 0.6 08/24/2012 1627       ASSESSMENT: This is a very pleasant 61  years old white male with metastatic non-small cell lung cancer, squamous cell carcinoma currently on systemic chemotherapy with carboplatin and Abraxane status post 4 cycles. The patient's platelets are low today.  PLAN: I recommend for him to delay the start of cycle #5 to next week until he has improvement in his platelets close to 100,000. For dry cough he was given a refill of Hycodan 5 ML by mouth every 6 hours as needed. The patient come back for followup  visit in 4 weeks with the start of cycle #6. He was advised to call immediately if he has any concerning symptoms in the interval.  All questions were answered. The patient knows to call the clinic with any problems, questions or concerns. We can certainly see the patient much sooner if necessary.  I spent 15 minutes counseling the patient face to face. The total time spent in the appointment was 25 minutes.

## 2012-10-11 NOTE — Patient Instructions (Signed)
Your platelets count are low today. We will delay the chemotherapy to start next week after improvement in her platelets count. Followup in 4 weeks

## 2012-10-11 NOTE — Telephone Encounter (Signed)
gv and printed appt schedule for pt for Dec  °

## 2012-10-13 ENCOUNTER — Other Ambulatory Visit: Payer: Self-pay | Admitting: Radiation Oncology

## 2012-10-16 ENCOUNTER — Telehealth: Payer: Self-pay | Admitting: Medical Oncology

## 2012-10-16 MED ORDER — GABAPENTIN 300 MG PO CAPS: 300.0000 mg | ORAL_CAPSULE | Freq: Three times a day (TID) | ORAL | Status: DC

## 2012-10-16 NOTE — Telephone Encounter (Signed)
Pt said he is not having trouble buttoning shirts or dropping things. His hands and feet hurt , for example he could not peel a potato because of the pain in his hands. I called in neurontin.

## 2012-10-16 NOTE — Telephone Encounter (Signed)
Pt reports new tingling in all fingers and his feet are still numb

## 2012-10-17 ENCOUNTER — Encounter (INDEPENDENT_AMBULATORY_CARE_PROVIDER_SITE_OTHER): Payer: BC Managed Care – PPO | Admitting: General Surgery

## 2012-10-18 ENCOUNTER — Other Ambulatory Visit (HOSPITAL_BASED_OUTPATIENT_CLINIC_OR_DEPARTMENT_OTHER): Payer: BC Managed Care – PPO | Admitting: Lab

## 2012-10-18 ENCOUNTER — Telehealth: Payer: Self-pay | Admitting: Internal Medicine

## 2012-10-18 ENCOUNTER — Ambulatory Visit (INDEPENDENT_AMBULATORY_CARE_PROVIDER_SITE_OTHER): Payer: BC Managed Care – PPO | Admitting: Surgery

## 2012-10-18 ENCOUNTER — Encounter (INDEPENDENT_AMBULATORY_CARE_PROVIDER_SITE_OTHER): Payer: Self-pay | Admitting: Surgery

## 2012-10-18 ENCOUNTER — Other Ambulatory Visit: Payer: Self-pay | Admitting: Medical Oncology

## 2012-10-18 ENCOUNTER — Telehealth: Payer: Self-pay | Admitting: *Deleted

## 2012-10-18 ENCOUNTER — Ambulatory Visit: Payer: BC Managed Care – PPO

## 2012-10-18 VITALS — BP 130/82 | HR 78 | Temp 98.2°F | Resp 16 | Ht 66.0 in | Wt 163.2 lb

## 2012-10-18 DIAGNOSIS — C3400 Malignant neoplasm of unspecified main bronchus: Secondary | ICD-10-CM

## 2012-10-18 DIAGNOSIS — L02219 Cutaneous abscess of trunk, unspecified: Secondary | ICD-10-CM

## 2012-10-18 DIAGNOSIS — L03312 Cellulitis of back [any part except buttock]: Secondary | ICD-10-CM

## 2012-10-18 DIAGNOSIS — C3490 Malignant neoplasm of unspecified part of unspecified bronchus or lung: Secondary | ICD-10-CM

## 2012-10-18 LAB — CBC WITH DIFFERENTIAL/PLATELET
EOS%: 2.7 % (ref 0.0–7.0)
MCH: 33.7 pg — ABNORMAL HIGH (ref 27.2–33.4)
MCHC: 34.4 g/dL (ref 32.0–36.0)
MCV: 98.1 fL — ABNORMAL HIGH (ref 79.3–98.0)
MONO%: 10.4 % (ref 0.0–14.0)
RBC: 2.8 10*6/uL — ABNORMAL LOW (ref 4.20–5.82)
RDW: 22.6 % — ABNORMAL HIGH (ref 11.0–14.6)

## 2012-10-18 LAB — COMPREHENSIVE METABOLIC PANEL (CC13)
AST: 15 U/L (ref 5–34)
Albumin: 3.2 g/dL — ABNORMAL LOW (ref 3.5–5.0)
Alkaline Phosphatase: 139 U/L (ref 40–150)
Potassium: 4.4 mEq/L (ref 3.5–5.1)
Sodium: 141 mEq/L (ref 136–145)
Total Bilirubin: 0.71 mg/dL (ref 0.20–1.20)
Total Protein: 6.1 g/dL — ABNORMAL LOW (ref 6.4–8.3)

## 2012-10-18 NOTE — Progress Notes (Signed)
i was asked to look at wound on pts back. I removed a dressing and there is some green drainage around wound. Pt has another day of antibiotics according to wife. I told her to call Dr Annye English office for an appt. WBC/ANC normal today. I told the wife to continue wound care per Dr Rayburn Ma .

## 2012-10-18 NOTE — Telephone Encounter (Signed)
Per staff phone call and POF I have added time to the scheduled appt on 12/18.  JMW

## 2012-10-18 NOTE — Progress Notes (Signed)
Subjective:     Patient ID: Derrick Crosby, male   DOB: [birth year], 61 y.o.   MRN: [id number]  HPI He is here today because he noticed some increased redness around the sebaceous cyst site. He is still on active treatment for his metastatic lung cancer  Review of Systems     Objective:   Physical Exam On exam, the wound is open. There is no purulence. There is some mild chronic cellulitis around the skin edges    Assessment:     Chronically infected back wound    Plan:        This will be very difficult to heal completely well he is undergoing therapy. He will continue the local wound care and now resumed his antibiotics.

## 2012-10-18 NOTE — Telephone Encounter (Signed)
gv and printed appt schedule for pt for Dec..the patient aware

## 2012-10-18 NOTE — Progress Notes (Signed)
Treatment today canceled today per Dr. Arbutus Ped due to platelets at  74K.  Will delay D1 of C5 until next week.  Wife to schedulers to r/s appts and shift everything one week later.

## 2012-10-25 ENCOUNTER — Other Ambulatory Visit: Payer: Self-pay | Admitting: *Deleted

## 2012-10-25 ENCOUNTER — Other Ambulatory Visit (HOSPITAL_BASED_OUTPATIENT_CLINIC_OR_DEPARTMENT_OTHER): Payer: BC Managed Care – PPO | Admitting: Lab

## 2012-10-25 ENCOUNTER — Ambulatory Visit (HOSPITAL_BASED_OUTPATIENT_CLINIC_OR_DEPARTMENT_OTHER): Payer: BC Managed Care – PPO

## 2012-10-25 VITALS — BP 109/70 | HR 61 | Temp 98.3°F | Resp 18

## 2012-10-25 DIAGNOSIS — C3490 Malignant neoplasm of unspecified part of unspecified bronchus or lung: Secondary | ICD-10-CM

## 2012-10-25 DIAGNOSIS — C3400 Malignant neoplasm of unspecified main bronchus: Secondary | ICD-10-CM

## 2012-10-25 DIAGNOSIS — Z5111 Encounter for antineoplastic chemotherapy: Secondary | ICD-10-CM

## 2012-10-25 LAB — CBC WITH DIFFERENTIAL/PLATELET
BASO%: 0.4 % (ref 0.0–2.0)
Basophils Absolute: 0 10*3/uL (ref 0.0–0.1)
EOS%: 3.3 % (ref 0.0–7.0)
MCH: 32.5 pg (ref 27.2–33.4)
MCHC: 33.7 g/dL (ref 32.0–36.0)
MCV: 96.4 fL (ref 79.3–98.0)
MONO%: 12.5 % (ref 0.0–14.0)
NEUT%: 56.1 % (ref 39.0–75.0)
RDW: 19.6 % — ABNORMAL HIGH (ref 11.0–14.6)
lymph#: 1.5 10*3/uL (ref 0.9–3.3)

## 2012-10-25 LAB — COMPREHENSIVE METABOLIC PANEL (CC13)
AST: 27 U/L (ref 5–34)
Albumin: 3.4 g/dL — ABNORMAL LOW (ref 3.5–5.0)
Alkaline Phosphatase: 146 U/L (ref 40–150)
BUN: 17 mg/dL (ref 7.0–26.0)
Glucose: 88 mg/dl (ref 70–99)
Potassium: 4.2 mEq/L (ref 3.5–5.1)
Sodium: 136 mEq/L (ref 136–145)
Total Bilirubin: 0.44 mg/dL (ref 0.20–1.20)

## 2012-10-25 MED ORDER — DEXAMETHASONE SODIUM PHOSPHATE 4 MG/ML IJ SOLN
20.0000 mg | Freq: Once | INTRAMUSCULAR | Status: AC
  Administered 2012-10-25: 20 mg via INTRAVENOUS

## 2012-10-25 MED ORDER — HEPARIN SOD (PORK) LOCK FLUSH 100 UNIT/ML IV SOLN
500.0000 [IU] | Freq: Once | INTRAVENOUS | Status: AC | PRN
  Administered 2012-10-25: 500 [IU]
  Filled 2012-10-25: qty 5

## 2012-10-25 MED ORDER — SODIUM CHLORIDE 0.9 % IJ SOLN
10.0000 mL | INTRAMUSCULAR | Status: DC | PRN
  Administered 2012-10-25: 10 mL
  Filled 2012-10-25: qty 10

## 2012-10-25 MED ORDER — PACLITAXEL PROTEIN-BOUND CHEMO INJECTION 100 MG
90.0000 mg/m2 | Freq: Once | INTRAVENOUS | Status: AC
  Administered 2012-10-25: 175 mg via INTRAVENOUS
  Filled 2012-10-25: qty 35

## 2012-10-25 MED ORDER — SODIUM CHLORIDE 0.9 % IV SOLN
511.5000 mg | Freq: Once | INTRAVENOUS | Status: AC
  Administered 2012-10-25: 510 mg via INTRAVENOUS
  Filled 2012-10-25: qty 51

## 2012-10-25 MED ORDER — SODIUM CHLORIDE 0.9 % IV SOLN
Freq: Once | INTRAVENOUS | Status: AC
  Administered 2012-10-25: 10:00:00 via INTRAVENOUS

## 2012-10-25 MED ORDER — ONDANSETRON 16 MG/50ML IVPB (CHCC)
16.0000 mg | Freq: Once | INTRAVENOUS | Status: AC
  Administered 2012-10-25: 16 mg via INTRAVENOUS

## 2012-10-25 NOTE — Patient Instructions (Addendum)
[facility] Discharge Instructions for Patients Receiving Chemotherapy  Today you received the following chemotherapy agents   To help prevent nausea and vomiting after your treatment, we encourage you to take your nausea medication  and take it as often as prescribed   If you develop nausea and vomiting that is not controlled by your nausea medication, call the clinic. If it is after clinic hours your family physician or the after hours number for the clinic or go to the Emergency Department.   BELOW ARE SYMPTOMS THAT SHOULD BE REPORTED IMMEDIATELY:  *FEVER GREATER THAN 100.5 F  *CHILLS WITH OR WITHOUT FEVER  NAUSEA AND VOMITING THAT IS NOT CONTROLLED WITH YOUR NAUSEA MEDICATION  *UNUSUAL SHORTNESS OF BREATH  *UNUSUAL BRUISING OR BLEEDING  TENDERNESS IN MOUTH AND THROAT WITH OR WITHOUT PRESENCE OF ULCERS  *URINARY PROBLEMS  *BOWEL PROBLEMS  UNUSUAL RASH Items with * indicate a potential emergency and should be followed up as soon as possible.  One of the nurses will contact you 24 hours after your treatment. Please let the nurse know about any problems that you may have experienced. Feel free to call the clinic you have any questions or concerns. The clinic phone number is [phone].   I have been informed and understand all the instructions given to me. I know to contact the clinic, my physician, or go to the Emergency Department if any problems should occur. I do not have any questions at this time, but understand that I may call the clinic during office hours   should I have any questions or need assistance in obtaining follow up care.    __________________________________________  _____________  __________ Signature of Patient or Authorized Representative            Date                   Time    __________________________________________ Nurse's Signature    

## 2012-10-31 ENCOUNTER — Encounter: Payer: Self-pay | Admitting: Internal Medicine

## 2012-10-31 ENCOUNTER — Other Ambulatory Visit: Payer: Self-pay | Admitting: Medical Oncology

## 2012-10-31 DIAGNOSIS — C3400 Malignant neoplasm of unspecified main bronchus: Secondary | ICD-10-CM

## 2012-10-31 NOTE — Progress Notes (Signed)
Faxed FMLA papers to [employer] [phone] fax [phone] phone.

## 2012-11-02 ENCOUNTER — Other Ambulatory Visit (HOSPITAL_BASED_OUTPATIENT_CLINIC_OR_DEPARTMENT_OTHER): Payer: BC Managed Care – PPO | Admitting: Lab

## 2012-11-02 ENCOUNTER — Ambulatory Visit (HOSPITAL_BASED_OUTPATIENT_CLINIC_OR_DEPARTMENT_OTHER): Payer: BC Managed Care – PPO

## 2012-11-02 ENCOUNTER — Other Ambulatory Visit: Payer: Self-pay | Admitting: *Deleted

## 2012-11-02 VITALS — BP 107/70 | HR 62 | Temp 97.4°F | Resp 20

## 2012-11-02 DIAGNOSIS — C3490 Malignant neoplasm of unspecified part of unspecified bronchus or lung: Secondary | ICD-10-CM

## 2012-11-02 DIAGNOSIS — Z5111 Encounter for antineoplastic chemotherapy: Secondary | ICD-10-CM

## 2012-11-02 DIAGNOSIS — C3400 Malignant neoplasm of unspecified main bronchus: Secondary | ICD-10-CM

## 2012-11-02 LAB — COMPREHENSIVE METABOLIC PANEL (CC13)
ALT: 29 U/L (ref 0–55)
CO2: 26 mEq/L (ref 22–29)
Creatinine: 1.1 mg/dL (ref 0.7–1.3)
Glucose: 93 mg/dl (ref 70–99)
Total Bilirubin: 0.41 mg/dL (ref 0.20–1.20)

## 2012-11-02 LAB — CBC WITH DIFFERENTIAL/PLATELET
BASO%: 0.5 % (ref 0.0–2.0)
HCT: 27.9 % — ABNORMAL LOW (ref 38.4–49.9)
LYMPH%: 33.9 % (ref 14.0–49.0)
MCH: 33 pg (ref 27.2–33.4)
MCHC: 34.1 g/dL (ref 32.0–36.0)
MCV: 96.9 fL (ref 79.3–98.0)
MONO#: 0.5 10*3/uL (ref 0.1–0.9)
MONO%: 12.1 % (ref 0.0–14.0)
NEUT%: 52.5 % (ref 39.0–75.0)
Platelets: 170 10*3/uL (ref 140–400)
WBC: 3.9 10*3/uL — ABNORMAL LOW (ref 4.0–10.3)

## 2012-11-02 MED ORDER — ONDANSETRON 8 MG/50ML IVPB (CHCC)
8.0000 mg | Freq: Once | INTRAVENOUS | Status: AC
Start: 2012-11-02 — End: 2012-11-02
  Administered 2012-11-02: 8 mg via INTRAVENOUS

## 2012-11-02 MED ORDER — PACLITAXEL PROTEIN-BOUND CHEMO INJECTION 100 MG
90.0000 mg/m2 | Freq: Once | INTRAVENOUS | Status: AC
  Administered 2012-11-02: 175 mg via INTRAVENOUS
  Filled 2012-11-02: qty 35

## 2012-11-02 MED ORDER — AMITRIPTYLINE HCL 25 MG PO TABS: 25.0000 mg | ORAL_TABLET | Freq: Every day | ORAL | Status: DC

## 2012-11-02 MED ORDER — DEXAMETHASONE SODIUM PHOSPHATE 10 MG/ML IJ SOLN
10.0000 mg | Freq: Once | INTRAMUSCULAR | Status: AC
  Administered 2012-11-02: 10 mg via INTRAVENOUS

## 2012-11-02 MED ORDER — SODIUM CHLORIDE 0.9 % IJ SOLN
10.0000 mL | INTRAMUSCULAR | Status: DC | PRN
  Administered 2012-11-02: 10 mL
  Filled 2012-11-02: qty 10

## 2012-11-02 MED ORDER — SODIUM CHLORIDE 0.9 % IV SOLN
Freq: Once | INTRAVENOUS | Status: AC
  Administered 2012-11-02: 11:00:00 via INTRAVENOUS

## 2012-11-02 MED ORDER — HEPARIN SOD (PORK) LOCK FLUSH 100 UNIT/ML IV SOLN
500.0000 [IU] | Freq: Once | INTRAVENOUS | Status: AC | PRN
  Administered 2012-11-02: 500 [IU]
  Filled 2012-11-02: qty 5

## 2012-11-02 NOTE — Patient Instructions (Signed)
[facility] Discharge Instructions for Patients Receiving Chemotherapy  Today you received the following chemotherapy agents abraxane  If you develop nausea and vomiting that is not controlled by your nausea medication, call the clinic. If it is after clinic hours your family physician or the after hours number for the clinic or go to the Emergency Department.   BELOW ARE SYMPTOMS THAT SHOULD BE REPORTED IMMEDIATELY:  *FEVER GREATER THAN 100.5 F  *CHILLS WITH OR WITHOUT FEVER  NAUSEA AND VOMITING THAT IS NOT CONTROLLED WITH YOUR NAUSEA MEDICATION  *UNUSUAL SHORTNESS OF BREATH  *UNUSUAL BRUISING OR BLEEDING  TENDERNESS IN MOUTH AND THROAT WITH OR WITHOUT PRESENCE OF ULCERS  *URINARY PROBLEMS  *BOWEL PROBLEMS  UNUSUAL RASH Items with * indicate a potential emergency and should be followed up as soon as possible.  One of the nurses will contact you 24 hours after your treatment. Please let the nurse know about any problems that you may have experienced. Feel free to call the clinic you have any questions or concerns. The clinic phone number is [phone].   I have been informed and understand all the instructions given to me. I know to contact the clinic, my physician, or go to the Emergency Department if any problems should occur. I do not have any questions at this time, but understand that I may call the clinic during office hours   should I have any questions or need assistance in obtaining follow up care.    __________________________________________  _____________  __________ Signature of Patient or Authorized Representative            Date                   Time    __________________________________________ Nurse's Signature

## 2012-11-02 NOTE — Progress Notes (Signed)
Pt complaining of continued numbness in legs. Spoke with Dr Arbutus Ped, Amitriptyline 25 mg ordered. Pt instructed to take at bedtime

## 2012-11-09 ENCOUNTER — Other Ambulatory Visit (HOSPITAL_BASED_OUTPATIENT_CLINIC_OR_DEPARTMENT_OTHER): Payer: BC Managed Care – PPO | Admitting: Lab

## 2012-11-09 ENCOUNTER — Encounter: Payer: Self-pay | Admitting: Physician Assistant

## 2012-11-09 ENCOUNTER — Telehealth: Payer: Self-pay | Admitting: *Deleted

## 2012-11-09 ENCOUNTER — Ambulatory Visit (HOSPITAL_BASED_OUTPATIENT_CLINIC_OR_DEPARTMENT_OTHER): Payer: BC Managed Care – PPO

## 2012-11-09 ENCOUNTER — Other Ambulatory Visit: Payer: Self-pay | Admitting: *Deleted

## 2012-11-09 ENCOUNTER — Telehealth: Payer: Self-pay | Admitting: Internal Medicine

## 2012-11-09 ENCOUNTER — Ambulatory Visit (HOSPITAL_BASED_OUTPATIENT_CLINIC_OR_DEPARTMENT_OTHER): Payer: BC Managed Care – PPO | Admitting: Physician Assistant

## 2012-11-09 VITALS — BP 113/65 | HR 82 | Temp 97.9°F | Resp 18 | Ht 66.0 in | Wt 164.2 lb

## 2012-11-09 DIAGNOSIS — Z5111 Encounter for antineoplastic chemotherapy: Secondary | ICD-10-CM

## 2012-11-09 DIAGNOSIS — C3400 Malignant neoplasm of unspecified main bronchus: Secondary | ICD-10-CM

## 2012-11-09 DIAGNOSIS — C7970 Secondary malignant neoplasm of unspecified adrenal gland: Secondary | ICD-10-CM

## 2012-11-09 DIAGNOSIS — C3490 Malignant neoplasm of unspecified part of unspecified bronchus or lung: Secondary | ICD-10-CM

## 2012-11-09 LAB — CBC WITH DIFFERENTIAL/PLATELET
Basophils Absolute: 0 10*3/uL (ref 0.0–0.1)
EOS%: 1.3 % (ref 0.0–7.0)
HCT: 29.6 % — ABNORMAL LOW (ref 38.4–49.9)
HGB: 10.4 g/dL — ABNORMAL LOW (ref 13.0–17.1)
LYMPH%: 41.2 % (ref 14.0–49.0)
MCH: 35.4 pg — ABNORMAL HIGH (ref 27.2–33.4)
MCV: 100.3 fL — ABNORMAL HIGH (ref 79.3–98.0)
MONO%: 9 % (ref 0.0–14.0)
NEUT%: 47.8 % (ref 39.0–75.0)
Platelets: 111 10*3/uL — ABNORMAL LOW (ref 140–400)

## 2012-11-09 LAB — COMPREHENSIVE METABOLIC PANEL (CC13)
AST: 21 U/L (ref 5–34)
BUN: 20 mg/dL (ref 7.0–26.0)
Calcium: 9.3 mg/dL (ref 8.4–10.4)
Chloride: 105 mEq/L (ref 98–107)
Creatinine: 1.1 mg/dL (ref 0.7–1.3)

## 2012-11-09 MED ORDER — DEXAMETHASONE SODIUM PHOSPHATE 10 MG/ML IJ SOLN
10.0000 mg | Freq: Once | INTRAMUSCULAR | Status: AC
  Administered 2012-11-09: 10 mg via INTRAVENOUS

## 2012-11-09 MED ORDER — HYDROCODONE-HOMATROPINE 5-1.5 MG/5ML PO SYRP: 5.0000 mL | ORAL_SOLUTION | Freq: Four times a day (QID) | ORAL | Status: DC | PRN

## 2012-11-09 MED ORDER — HEPARIN SOD (PORK) LOCK FLUSH 100 UNIT/ML IV SOLN
500.0000 [IU] | Freq: Once | INTRAVENOUS | Status: AC | PRN
  Administered 2012-11-09: 500 [IU]
  Filled 2012-11-09: qty 5

## 2012-11-09 MED ORDER — SODIUM CHLORIDE 0.9 % IJ SOLN
10.0000 mL | INTRAMUSCULAR | Status: DC | PRN
Start: 2012-11-09 — End: 2012-11-09
  Administered 2012-11-09: 10 mL
  Filled 2012-11-09: qty 10

## 2012-11-09 MED ORDER — PACLITAXEL PROTEIN-BOUND CHEMO INJECTION 100 MG
90.0000 mg/m2 | Freq: Once | INTRAVENOUS | Status: AC
  Administered 2012-11-09: 175 mg via INTRAVENOUS
  Filled 2012-11-09: qty 35

## 2012-11-09 MED ORDER — ONDANSETRON 8 MG/50ML IVPB (CHCC)
8.0000 mg | Freq: Once | INTRAVENOUS | Status: AC
  Administered 2012-11-09: 8 mg via INTRAVENOUS

## 2012-11-09 MED ORDER — SODIUM CHLORIDE 0.9 % IV SOLN
Freq: Once | INTRAVENOUS | Status: AC
  Administered 2012-11-09: 12:00:00 via INTRAVENOUS

## 2012-11-09 NOTE — Telephone Encounter (Signed)
gv and printed appt schedule for pt for Jan 2014...emailed michelle to add chemo...the patient aware °

## 2012-11-09 NOTE — Patient Instructions (Addendum)
Followup in 2 weeks with your next cycle of chemotherapy

## 2012-11-09 NOTE — Telephone Encounter (Signed)
Per staff message and POF I have scheduled appts.  JMW  

## 2012-11-09 NOTE — Patient Instructions (Addendum)
[facility] Discharge Instructions for Patients Receiving Chemotherapy  Today you received the following chemotherapy agents :  Abraxane.  To help prevent nausea and vomiting after your treatment, we encourage you to take your nausea medication as instructed by your physician.    If you develop nausea and vomiting that is not controlled by your nausea medication, call the clinic. If it is after clinic hours your family physician or the after hours number for the clinic or go to the Emergency Department.   BELOW ARE SYMPTOMS THAT SHOULD BE REPORTED IMMEDIATELY:  *FEVER GREATER THAN 100.5 F  *CHILLS WITH OR WITHOUT FEVER  NAUSEA AND VOMITING THAT IS NOT CONTROLLED WITH YOUR NAUSEA MEDICATION  *UNUSUAL SHORTNESS OF BREATH  *UNUSUAL BRUISING OR BLEEDING  TENDERNESS IN MOUTH AND THROAT WITH OR WITHOUT PRESENCE OF ULCERS  *URINARY PROBLEMS  *BOWEL PROBLEMS  UNUSUAL RASH Items with * indicate a potential emergency and should be followed up as soon as possible.  One of the nurses will contact you 24 hours after your treatment. Please let the nurse know about any problems that you may have experienced. Feel free to call the clinic you have any questions or concerns. The clinic phone number is [phone].   I have been informed and understand all the instructions given to me. I know to contact the clinic, my physician, or go to the Emergency Department if any problems should occur. I do not have any questions at this time, but understand that I may call the clinic during office hours   should I have any questions or need assistance in obtaining follow up care.    __________________________________________  _____________  __________ Signature of Patient or Authorized Representative            Date                   Time    __________________________________________ Nurse's Signature    

## 2012-11-11 NOTE — Progress Notes (Addendum)
[facility] [phone]   Fax:(336) [phone]  OFFICE PROGRESS NOTE  [name], PA [facility] [address] [place] [address]  DIAGNOSIS: Metastatic non-small cell lung cancer, squamous cell carcinoma diagnosed in August of 2013.   PRIOR THERAPY: status post palliative radiotherapy to the left hilar mass under the care of Dr. Mitzi Hansen.   CURRENT THERAPY:  Systemic chemotherapy with carboplatin for AUC of 5 on day 1 and Abraxane 100 mg/M2 on days 1, 8 and 15 every 3 weeks. The patient is status post 4 cycles and day 1 and 8 of cycle 5  INTERVAL HISTORY: Derrick Crosby 62 y.o. male returns to the clinic today for followup visit accompanied his wife and sister-in-law. The patient is feeling fine today with no specific complaints except for cough productive of white secretions. He denied having any significant chest pain or shortness of breath, fever or chills. He denied hemoptysis. He requests a refill of his cough syrup.The patient denied having any significant weight loss or night sweats. He presents for day 15 of cycle #5 today.  MEDICAL HISTORY: Past Medical History  Diagnosis Date  . Hypertension   . Hyperglyceridemia, pure   . COPD (chronic obstructive pulmonary disease)   . Cancer     Skin  Cancer - Left ear.-  . Hemoptysis   . Skin cancer     left ear  . Anxiety   . Depression   . Lung cancer 06/13/12    lung mass /right hilar suspicious for primary bronchogenic ca  . Hemoptysis   . Seasonal allergies   . History of radiation therapy 07/05/12-07/26/12    lung ca     ALLERGIES:   has no known allergies.  MEDICATIONS:  Current Outpatient Prescriptions  Medication Sig Dispense Refill  . acetaminophen (TYLENOL) 325 MG tablet Take 650 mg by mouth every 6 (six) hours as needed. For general aches      . albuterol (PROVENTIL HFA;VENTOLIN HFA) 108 (90 BASE) MCG/ACT inhaler Inhale 2 puffs into the lungs every 6 (six) hours as  needed. wheezing      . amitriptyline (ELAVIL) 25 MG tablet Take 1 tablet (25 mg total) by mouth at bedtime.  30 tablet  0  . aspirin EC 81 MG tablet Take 81 mg by mouth daily.      . citalopram (CELEXA) 20 MG tablet Take 20 mg by mouth every evening.       . clonazePAM (KLONOPIN) 0.5 MG tablet Take 0.5 mg by mouth 2 (two) times daily as needed. For anxiety      . fish oil-omega-3 fatty acids 1000 MG capsule Take 1 g by mouth daily.       Marland Kitchen gabapentin (NEURONTIN) 300 MG capsule Take 1 capsule (300 mg total) by mouth 3 (three) times daily.  90 capsule  0  . guaiFENesin (MUCINEX) 600 MG 12 hr tablet Take 1,200 mg by mouth 2 (two) times daily.      Marland Kitchen HYDROcodone-homatropine (HYCODAN) 5-1.5 MG/5ML syrup Take 5 mLs by mouth every 6 (six) hours as needed for cough.  240 mL  0  . lidocaine-prilocaine (EMLA) cream Apply 1 application topically as needed. Apply to [facility] A cath site 30-60 min before chemotherapy. Used every Wednesday      . loratadine (CLARITIN) 10 MG tablet Take 10 mg by mouth daily.      Marland Kitchen LORazepam (ATIVAN) 0.5 MG tablet TAKE 1/2-1 TABLET BY MOUTH EVERY 8 HOURS AS  NEEDED FOR NAUSEA  30 tablet  0  . Multiple Vitamins-Minerals (MULTIVITAMIN WITH MINERALS) tablet Take 1 tablet by mouth daily.      . ondansetron (ZOFRAN) 8 MG tablet TAKE 1 TAB BY MOUTH EVERY 8 HRS AS NEEDED FOR NAUSEA *FILL 09/11/12 INS LIMITS 21TABS PER 30DAYS *  21 tablet  0  . PACLitaxel-protein bound (ABRAXANE) 100 MG injection Inject 175 mg into the vein once.       . polyethylene glycol powder (GLYCOLAX/MIRALAX) powder Take 17 g by mouth daily.  255 g  0  . prochlorperazine (COMPAZINE) 10 MG tablet Take 10 mg by mouth every 6 (six) hours as needed.      . sucralfate (CARAFATE) 1 G tablet TAKE 1 TABLET (1 G TOTAL) BY MOUTH 4 (FOUR) TIMES DAILY. DISSOLVE IN 15 CC WATER.  120 tablet  2  . triamcinolone cream (KENALOG) 0.1 % Apply topically 2 (two) times daily.        SURGICAL HISTORY:  Past Surgical History  Procedure  Date  . Tympanoplasty   . Video bronchoscopy 06/13/12    with endobronchial u/s/ with biospy left hilar mass/Dr. Lonia Farber  . Tonsillectomy     child    REVIEW OF SYSTEMS:  A comprehensive review of systems was negative except for: Constitutional: positive for fatigue Respiratory: positive for cough   PHYSICAL EXAMINATION: General appearance: alert, cooperative and no distress Head: Normocephalic, without obvious abnormality, atraumatic Neck: no adenopathy Lymph nodes: Cervical, supraclavicular, and axillary nodes normal. Resp: clear to auscultation bilaterally Cardio: regular rate and rhythm, S1, S2 normal, no murmur, click, rub or gallop GI: soft, non-tender; bowel sounds normal; no masses,  no organomegaly Extremities: extremities normal, atraumatic, no cyanosis or edema Neurologic: Alert and oriented X 3, normal strength and tone. Normal symmetric reflexes. Normal coordination and gait  ECOG PERFORMANCE STATUS: 1 - Symptomatic but completely ambulatory  Blood pressure 113/65, pulse 82, temperature 97.9 F (36.6 C), temperature source Oral, resp. rate 18, height 5\' 6"  (1.676 m), weight 164 lb 3.2 oz (74.481 kg).  LABORATORY DATA: Lab Results  Component Value Date   WBC 2.6* 11/09/2012   HGB 10.4* 11/09/2012   HCT 29.6* 11/09/2012   MCV 100.3* 11/09/2012   PLT 111* 11/09/2012      Chemistry      Component Value Date/Time   NA 142 11/09/2012 0941   NA 137 09/04/2012 2100   K 3.9 11/09/2012 0941   K 3.9 09/04/2012 2100   CL 105 11/09/2012 0941   CL 102 09/04/2012 2100   CO2 27 11/09/2012 0941   CO2 27 08/24/2012 1627   BUN 20.0 11/09/2012 0941   BUN 24* 09/04/2012 2100   CREATININE 1.1 11/09/2012 0941   CREATININE 1.20 09/04/2012 2100      Component Value Date/Time   CALCIUM 9.3 11/09/2012 0941   CALCIUM 9.2 08/24/2012 1627   ALKPHOS 113 11/09/2012 0941   ALKPHOS 95 08/24/2012 1627   AST 21 11/09/2012 0941   AST 25 08/24/2012 1627   ALT 28 11/09/2012 0941   ALT 41  08/24/2012 1627   BILITOT 0.56 11/09/2012 0941   BILITOT 0.6 08/24/2012 1627       ASSESSMENT/PLAN: This is a very pleasant 62 years old white male with metastatic non-small cell lung cancer, squamous cell carcinoma currently on systemic chemotherapy with carboplatin and Abraxane status post 4 cycles. The patient was discussed with Dr. Arbutus Ped. His ANC is 1.2 today, as he is receiving Abraxane only today, he  will proceed with treatment.He will proceed with day 15 of cycle #5 today as scheduled. He will return in 2 weeks for another symptom management visit during cycle #6. He is given a prescription for Hycodan cough syrup.   Laural Benes, Reinaldo Helt E, PA-C   All questions were answered. The patient knows to call the clinic with any problems, questions or concerns. We can certainly see the patient much sooner if necessary.  I spent 20 minutes counseling the patient face to face. The total time spent in the appointment was 30 minutes.

## 2012-11-15 ENCOUNTER — Other Ambulatory Visit (HOSPITAL_BASED_OUTPATIENT_CLINIC_OR_DEPARTMENT_OTHER): Payer: BC Managed Care – PPO | Admitting: Lab

## 2012-11-15 ENCOUNTER — Ambulatory Visit (HOSPITAL_BASED_OUTPATIENT_CLINIC_OR_DEPARTMENT_OTHER): Payer: BC Managed Care – PPO

## 2012-11-15 ENCOUNTER — Other Ambulatory Visit: Payer: Self-pay | Admitting: Internal Medicine

## 2012-11-15 VITALS — BP 111/71 | HR 90 | Temp 97.6°F | Resp 20

## 2012-11-15 DIAGNOSIS — Z452 Encounter for adjustment and management of vascular access device: Secondary | ICD-10-CM

## 2012-11-15 DIAGNOSIS — C3410 Malignant neoplasm of upper lobe, unspecified bronchus or lung: Secondary | ICD-10-CM

## 2012-11-15 DIAGNOSIS — C3490 Malignant neoplasm of unspecified part of unspecified bronchus or lung: Secondary | ICD-10-CM

## 2012-11-15 DIAGNOSIS — C3400 Malignant neoplasm of unspecified main bronchus: Secondary | ICD-10-CM

## 2012-11-15 LAB — CBC WITH DIFFERENTIAL/PLATELET
EOS%: 0.9 % (ref 0.0–7.0)
LYMPH%: 33.8 % (ref 14.0–49.0)
MCH: 33.3 pg (ref 27.2–33.4)
MCHC: 34.8 g/dL (ref 32.0–36.0)
MCV: 95.7 fL (ref 79.3–98.0)
MONO%: 5.1 % (ref 0.0–14.0)
Platelets: 75 10*3/uL — ABNORMAL LOW (ref 140–400)
RBC: 3.03 10*6/uL — ABNORMAL LOW (ref 4.20–5.82)
RDW: 16.8 % — ABNORMAL HIGH (ref 11.0–14.6)
nRBC: 0 % (ref 0–0)

## 2012-11-15 LAB — COMPREHENSIVE METABOLIC PANEL (CC13)
ALT: 23 U/L (ref 0–55)
CO2: 25 mEq/L (ref 22–29)
Calcium: 9.1 mg/dL (ref 8.4–10.4)
Chloride: 104 mEq/L (ref 98–107)
Glucose: 107 mg/dl — ABNORMAL HIGH (ref 70–99)
Sodium: 138 mEq/L (ref 136–145)
Total Bilirubin: 0.85 mg/dL (ref 0.20–1.20)
Total Protein: 6.5 g/dL (ref 6.4–8.3)

## 2012-11-15 MED ORDER — SODIUM CHLORIDE 0.9 % IJ SOLN
10.0000 mL | INTRAMUSCULAR | Status: DC | PRN
  Administered 2012-11-15: 10 mL via INTRAVENOUS
  Filled 2012-11-15: qty 10

## 2012-11-15 MED ORDER — HEPARIN SOD (PORK) LOCK FLUSH 100 UNIT/ML IV SOLN
500.0000 [IU] | Freq: Once | INTRAVENOUS | Status: AC
  Administered 2012-11-15: 500 [IU] via INTRAVENOUS
  Filled 2012-11-15: qty 5

## 2012-11-15 NOTE — Progress Notes (Signed)
1035 Reviewed lab with MD, per Dr. Arbutus Ped hold treatment today.

## 2012-11-15 NOTE — Progress Notes (Signed)
tx held per dr Arbutus Ped due to low platelets (75).   dmr

## 2012-11-22 ENCOUNTER — Other Ambulatory Visit (HOSPITAL_BASED_OUTPATIENT_CLINIC_OR_DEPARTMENT_OTHER): Payer: BC Managed Care – PPO | Admitting: Lab

## 2012-11-22 ENCOUNTER — Encounter: Payer: Self-pay | Admitting: Physician Assistant

## 2012-11-22 ENCOUNTER — Other Ambulatory Visit: Payer: BC Managed Care – PPO | Admitting: Lab

## 2012-11-22 ENCOUNTER — Ambulatory Visit (HOSPITAL_BASED_OUTPATIENT_CLINIC_OR_DEPARTMENT_OTHER): Payer: BC Managed Care – PPO | Admitting: Physician Assistant

## 2012-11-22 ENCOUNTER — Ambulatory Visit: Payer: BC Managed Care – PPO

## 2012-11-22 ENCOUNTER — Telehealth: Payer: Self-pay | Admitting: Internal Medicine

## 2012-11-22 VITALS — BP 120/60 | HR 83 | Temp 97.6°F | Resp 20 | Ht 66.0 in | Wt 170.4 lb

## 2012-11-22 DIAGNOSIS — C3490 Malignant neoplasm of unspecified part of unspecified bronchus or lung: Secondary | ICD-10-CM

## 2012-11-22 DIAGNOSIS — C7970 Secondary malignant neoplasm of unspecified adrenal gland: Secondary | ICD-10-CM

## 2012-11-22 DIAGNOSIS — G629 Polyneuropathy, unspecified: Secondary | ICD-10-CM

## 2012-11-22 DIAGNOSIS — G609 Hereditary and idiopathic neuropathy, unspecified: Secondary | ICD-10-CM

## 2012-11-22 DIAGNOSIS — C3400 Malignant neoplasm of unspecified main bronchus: Secondary | ICD-10-CM

## 2012-11-22 LAB — CBC WITH DIFFERENTIAL/PLATELET
BASO%: 0.7 % (ref 0.0–2.0)
Eosinophils Absolute: 0.1 10*3/uL (ref 0.0–0.5)
MCHC: 34.7 g/dL (ref 32.0–36.0)
MONO#: 0.5 10*3/uL (ref 0.1–0.9)
NEUT#: 1.5 10*3/uL (ref 1.5–6.5)
Platelets: 75 10*3/uL — ABNORMAL LOW (ref 140–400)
RBC: 2.74 10*6/uL — ABNORMAL LOW (ref 4.20–5.82)
RDW: 18 % — ABNORMAL HIGH (ref 11.0–14.6)
WBC: 3 10*3/uL — ABNORMAL LOW (ref 4.0–10.3)
lymph#: 0.9 10*3/uL (ref 0.9–3.3)
nRBC: 0 % (ref 0–0)

## 2012-11-22 MED ORDER — GABAPENTIN 300 MG PO CAPS: 300.0000 mg | ORAL_CAPSULE | Freq: Three times a day (TID) | ORAL | Status: DC

## 2012-11-22 MED ORDER — AMITRIPTYLINE HCL 25 MG PO TABS: 25.0000 mg | ORAL_TABLET | Freq: Every day | ORAL | Status: DC

## 2012-11-22 NOTE — Telephone Encounter (Signed)
Gave pt appt for January 2014 lab and MD, gave pt oral contrast for CT

## 2012-11-22 NOTE — Patient Instructions (Addendum)
Your counts are to low to receive chemotherapy today We will arrange for you to follow up with Dr. Arbutus Ped next week with a restaging CT scan of your chest, abdomen and pelvis to re-evaluate your disease

## 2012-11-23 ENCOUNTER — Other Ambulatory Visit: Payer: Self-pay | Admitting: Medical Oncology

## 2012-11-23 NOTE — Telephone Encounter (Signed)
Wife requests refill for pt on elavil-per pt pharmacist refill pending insurance-pharmacist will ntify pt

## 2012-11-26 NOTE — Progress Notes (Signed)
[facility] Telephone:(336) [phone]   Fax:(336) [phone]  OFFICE PROGRESS NOTE  [name], PA [facility] [address] [place] [address]  DIAGNOSIS: Metastatic non-small cell lung cancer, squamous cell carcinoma diagnosed in August of 2013.   PRIOR THERAPY: status post palliative radiotherapy to the left hilar mass under the care of Dr. Mitzi Hansen.   CURRENT THERAPY:  Systemic chemotherapy with carboplatin for AUC of 5 on day 1 and Abraxane 100 mg/M2 on days 1, 8 and 15 every 3 weeks. The patient is status post 4 cycles and day 1 and 8 of cycle 5  INTERVAL HISTORY: Korie P Eckroth 62 y.o. male returns to the clinic today for followup visit accompanied his wife. He presents to proceed with cycle #6 of his systemic chemotherapy with carboplatin and Abraxane. He requests refills for his gabapentin and Elavil. Both these medications have been helpful with the peripheral neuropathy symptoms affecting his feet. He voiced no other complaints today.  He denied having any significant chest pain or shortness of breath, fever or chills. He denied hemoptysis. The patient denied having any significant weight loss or night sweats.   MEDICAL HISTORY: Past Medical History  Diagnosis Date  . Hypertension   . Hyperglyceridemia, pure   . COPD (chronic obstructive pulmonary disease)   . Cancer     Skin  Cancer - Left ear.-  . Hemoptysis   . Skin cancer     left ear  . Anxiety   . Depression   . Lung cancer 06/13/12    lung mass /right hilar suspicious for primary bronchogenic ca  . Hemoptysis   . Seasonal allergies   . History of radiation therapy 07/05/12-07/26/12    lung ca     ALLERGIES:   has no known allergies.  MEDICATIONS:  Current Outpatient Prescriptions  Medication Sig Dispense Refill  . acetaminophen (TYLENOL) 325 MG tablet Take 650 mg by mouth every 6 (six) hours as needed. For general aches      . albuterol (PROVENTIL HFA;VENTOLIN HFA) 108 (90  BASE) MCG/ACT inhaler Inhale 2 puffs into the lungs every 6 (six) hours as needed. wheezing      . amitriptyline (ELAVIL) 25 MG tablet Take 1 tablet (25 mg total) by mouth at bedtime.  30 tablet  2  . aspirin EC 81 MG tablet Take 81 mg by mouth daily.      . citalopram (CELEXA) 20 MG tablet Take 20 mg by mouth every evening.       . clonazePAM (KLONOPIN) 0.5 MG tablet Take 0.5 mg by mouth 2 (two) times daily as needed. For anxiety      . fish oil-omega-3 fatty acids 1000 MG capsule Take 1 g by mouth daily.       Marland Kitchen gabapentin (NEURONTIN) 300 MG capsule Take 1 capsule (300 mg total) by mouth 3 (three) times daily.  90 capsule  2  . guaiFENesin (MUCINEX) 600 MG 12 hr tablet Take 1,200 mg by mouth 2 (two) times daily.      Marland Kitchen HYDROcodone-homatropine (HYCODAN) 5-1.5 MG/5ML syrup Take 5 mLs by mouth every 6 (six) hours as needed for cough.  240 mL  0  . lidocaine-prilocaine (EMLA) cream Apply 1 application topically as needed. Apply to [facility] A cath site 30-60 min before chemotherapy. Used every Wednesday      . loratadine (CLARITIN) 10 MG tablet Take 10 mg by mouth daily.      Marland Kitchen LORazepam (ATIVAN) 0.5 MG  tablet TAKE 1/2-1 TABLET BY MOUTH EVERY 8 HOURS AS NEEDED FOR NAUSEA  30 tablet  0  . Multiple Vitamins-Minerals (MULTIVITAMIN WITH MINERALS) tablet Take 1 tablet by mouth daily.      . ondansetron (ZOFRAN) 8 MG tablet TAKE 1 TAB BY MOUTH EVERY 8 HRS AS NEEDED FOR NAUSEA *FILL 09/11/12 INS LIMITS 21TABS PER 30DAYS *  21 tablet  0  . PACLitaxel-protein bound (ABRAXANE) 100 MG injection Inject 175 mg into the vein once.       . polyethylene glycol powder (GLYCOLAX/MIRALAX) powder Take 17 g by mouth daily.  255 g  0  . prochlorperazine (COMPAZINE) 10 MG tablet Take 10 mg by mouth every 6 (six) hours as needed.      . sucralfate (CARAFATE) 1 G tablet TAKE 1 TABLET (1 G TOTAL) BY MOUTH 4 (FOUR) TIMES DAILY. DISSOLVE IN 15 CC WATER.  120 tablet  2  . triamcinolone cream (KENALOG) 0.1 % Apply topically 2 (two)  times daily.        SURGICAL HISTORY:  Past Surgical History  Procedure Date  . Tympanoplasty   . Video bronchoscopy 06/13/12    with endobronchial u/s/ with biospy left hilar mass/Dr. Lonia Farber  . Tonsillectomy     child    REVIEW OF SYSTEMS:  Pertinent items are noted in HPI.   PHYSICAL EXAMINATION: General appearance: alert, cooperative and no distress Head: Normocephalic, without obvious abnormality, atraumatic Neck: no adenopathy Lymph nodes: Cervical, supraclavicular, and axillary nodes normal. Resp: clear to auscultation bilaterally Cardio: regular rate and rhythm, S1, S2 normal, no murmur, click, rub or gallop GI: soft, non-tender; bowel sounds normal; no masses,  no organomegaly Extremities: extremities normal, atraumatic, no cyanosis or edema Neurologic: Alert and oriented X 3, normal strength and tone. Normal symmetric reflexes. Normal coordination and gait  ECOG PERFORMANCE STATUS: 1 - Symptomatic but completely ambulatory  Blood pressure 120/60, pulse 83, temperature 97.6 F (36.4 C), temperature source Oral, resp. rate 20, height 5\' 6"  (1.676 m), weight 170 lb 6.4 oz (77.293 kg).  LABORATORY DATA: Lab Results  Component Value Date   WBC 3.0* 11/22/2012   HGB 9.3* 11/22/2012   HCT 26.8* 11/22/2012   MCV 97.8 11/22/2012   PLT 75* 11/22/2012      Chemistry      Component Value Date/Time   NA 138 11/15/2012 1014   NA 137 09/04/2012 2100   K 4.0 11/15/2012 1014   K 3.9 09/04/2012 2100   CL 104 11/15/2012 1014   CL 102 09/04/2012 2100   CO2 25 11/15/2012 1014   CO2 27 08/24/2012 1627   BUN 19.0 11/15/2012 1014   BUN 24* 09/04/2012 2100   CREATININE 1.0 11/15/2012 1014   CREATININE 1.20 09/04/2012 2100      Component Value Date/Time   CALCIUM 9.1 11/15/2012 1014   CALCIUM 9.2 08/24/2012 1627   ALKPHOS 100 11/15/2012 1014   ALKPHOS 95 08/24/2012 1627   AST 18 11/15/2012 1014   AST 25 08/24/2012 1627   ALT 23 11/15/2012 1014   ALT 41 08/24/2012 1627   BILITOT  0.85 11/15/2012 1014   BILITOT 0.6 08/24/2012 1627       ASSESSMENT/PLAN: This is a very pleasant 62 years old white male with metastatic non-small cell lung cancer, squamous cell carcinoma currently on systemic chemotherapy with carboplatin and Abraxane status post 5 cycles. The patient was discussed with Dr. Arbutus Ped. His platelet count is subtherapeutic proceed with cycle #6 at 75,000. We'll cancel cycle #  6 and arrange for him to have a restaging CT scan of the chest abdomen and pelvis and follow with Dr. Gwenyth Bouillon in one week also with a repeat CBC differential and C. met. Prescriptions for her his gabapentin and Elavil were sent to his pharmacy of record via E. scribed.   Laural Benes, Chaun Uemura E, PA-C   All questions were answered. The patient knows to call the clinic with any problems, questions or concerns. We can certainly see the patient much sooner if necessary.  I spent 20 minutes counseling the patient face to face. The total time spent in the appointment was 30 minutes.

## 2012-11-27 ENCOUNTER — Ambulatory Visit (HOSPITAL_COMMUNITY)
Admission: RE | Admit: 2012-11-27 | Discharge: 2012-11-27 | Disposition: A | Discharge status: Home / Self Care | Payer: BC Managed Care – PPO | Source: Ambulatory Visit | Attending: Internal Medicine | Admitting: Internal Medicine

## 2012-11-27 ENCOUNTER — Encounter (HOSPITAL_COMMUNITY): Payer: Self-pay

## 2012-11-27 DIAGNOSIS — K7689 Other specified diseases of liver: Secondary | ICD-10-CM | POA: Insufficient documentation

## 2012-11-27 DIAGNOSIS — Z79899 Other long term (current) drug therapy: Secondary | ICD-10-CM | POA: Insufficient documentation

## 2012-11-27 DIAGNOSIS — J438 Other emphysema: Secondary | ICD-10-CM | POA: Insufficient documentation

## 2012-11-27 DIAGNOSIS — C3490 Malignant neoplasm of unspecified part of unspecified bronchus or lung: Secondary | ICD-10-CM | POA: Insufficient documentation

## 2012-11-27 MED ORDER — IOHEXOL 300 MG/ML  SOLN
100.0000 mL | Freq: Once | INTRAMUSCULAR | Status: AC | PRN
  Administered 2012-11-27: 100 mL via INTRAVENOUS

## 2012-11-29 ENCOUNTER — Other Ambulatory Visit: Payer: BC Managed Care – PPO | Admitting: Lab

## 2012-11-29 ENCOUNTER — Encounter: Payer: Self-pay | Admitting: Internal Medicine

## 2012-11-29 ENCOUNTER — Telehealth: Payer: Self-pay | Admitting: Internal Medicine

## 2012-11-29 ENCOUNTER — Other Ambulatory Visit (HOSPITAL_BASED_OUTPATIENT_CLINIC_OR_DEPARTMENT_OTHER): Payer: BC Managed Care – PPO | Admitting: Lab

## 2012-11-29 ENCOUNTER — Ambulatory Visit: Payer: BC Managed Care – PPO

## 2012-11-29 ENCOUNTER — Ambulatory Visit (HOSPITAL_BASED_OUTPATIENT_CLINIC_OR_DEPARTMENT_OTHER): Payer: BC Managed Care – PPO | Admitting: Internal Medicine

## 2012-11-29 VITALS — BP 111/67 | HR 78 | Temp 97.2°F | Resp 18 | Ht 66.0 in | Wt 171.0 lb

## 2012-11-29 DIAGNOSIS — C3400 Malignant neoplasm of unspecified main bronchus: Secondary | ICD-10-CM

## 2012-11-29 LAB — COMPREHENSIVE METABOLIC PANEL (CC13)
ALT: 25 U/L (ref 0–55)
AST: 20 U/L (ref 5–34)
CO2: 25 mEq/L (ref 22–29)
Chloride: 106 mEq/L (ref 98–107)
Creatinine: 1.3 mg/dL (ref 0.7–1.3)
Sodium: 141 mEq/L (ref 136–145)
Total Bilirubin: 0.53 mg/dL (ref 0.20–1.20)
Total Protein: 6.7 g/dL (ref 6.4–8.3)

## 2012-11-29 LAB — CBC WITH DIFFERENTIAL/PLATELET
BASO%: 0.5 % (ref 0.0–2.0)
EOS%: 2.1 % (ref 0.0–7.0)
HCT: 29.3 % — ABNORMAL LOW (ref 38.4–49.9)
LYMPH%: 31.9 % (ref 14.0–49.0)
MCH: 36.3 pg — ABNORMAL HIGH (ref 27.2–33.4)
MCHC: 35.7 g/dL (ref 32.0–36.0)
MONO#: 0.6 10*3/uL (ref 0.1–0.9)
NEUT%: 48.7 % (ref 39.0–75.0)
RBC: 2.89 10*6/uL — ABNORMAL LOW (ref 4.20–5.82)
WBC: 3.8 10*3/uL — ABNORMAL LOW (ref 4.0–10.3)
lymph#: 1.2 10*3/uL (ref 0.9–3.3)

## 2012-11-29 NOTE — Patient Instructions (Signed)
No evidence for disease progression on his recent scan. Continue on observation with repeat CT scan of the chest, abdomen and pelvis in 3 months.

## 2012-11-29 NOTE — Telephone Encounter (Signed)
appts made and printed for pt,pt given contrast and aware they will get a call with the scan appt aom

## 2012-11-29 NOTE — Progress Notes (Signed)
[facility] Telephone:([phone]   Fax:(336) [phone]  OFFICE PROGRESS NOTE  [name], PA [facility] [address] [place] [address]  DIAGNOSIS: Metastatic non-small cell lung cancer, squamous cell carcinoma diagnosed in August of 2013.   PRIOR THERAPY: status post palliative radiotherapy to the left hilar mass under the care of Dr. Mitzi Hansen.   CURRENT THERAPY:  Systemic chemotherapy with carboplatin for AUC of 5 on day 1 and Abraxane 100 mg/M2 on days 1, 8 and 15 every 3 weeks. The patient is status post 4 cycles and day 1 and 8 of cycle 5  INTERVAL HISTORY: Derrick Crosby 62 y.o. male returns to the clinic today for followup visit accompanied by his wife and sister. The patient is doing fine with no specific complaints. He completed 5 cycles of chemotherapy with carboplatin and Abraxane. Cycle #6 was not given because of persistent thrombocytopenia and increasing fatigue. The patient denied having any current nausea or vomiting, no fever or chills. He denied having any significant chest pain, shortness of breath, cough or hemoptysis. He had repeat CT scan of the chest, abdomen and pelvis performed recently and he is here for evaluation and discussion of his scan results.  MEDICAL HISTORY: Past Medical History  Diagnosis Date  . Hypertension   . Hyperglyceridemia, pure   . COPD (chronic obstructive pulmonary disease)   . Cancer     Skin  Cancer - Left ear.-  . Hemoptysis   . Skin cancer     left ear  . Anxiety   . Depression   . Lung cancer 06/13/12    lung mass /right hilar suspicious for primary bronchogenic ca  . Hemoptysis   . Seasonal allergies   . History of radiation therapy 07/05/12-07/26/12    lung ca     ALLERGIES:   has no known allergies.  MEDICATIONS:  Current Outpatient Prescriptions  Medication Sig Dispense Refill  . acetaminophen (TYLENOL) 325 MG tablet Take 650 mg by mouth every 6 (six) hours as needed. For general  aches      . albuterol (PROVENTIL HFA;VENTOLIN HFA) 108 (90 BASE) MCG/ACT inhaler Inhale 2 puffs into the lungs every 6 (six) hours as needed. wheezing      . amitriptyline (ELAVIL) 25 MG tablet Take 1 tablet (25 mg total) by mouth at bedtime.  30 tablet  2  . aspirin EC 81 MG tablet Take 81 mg by mouth daily.      . citalopram (CELEXA) 20 MG tablet Take 20 mg by mouth every evening.       . clonazePAM (KLONOPIN) 0.5 MG tablet Take 0.5 mg by mouth 2 (two) times daily as needed. For anxiety      . fish oil-omega-3 fatty acids 1000 MG capsule Take 1 g by mouth daily.       Marland Kitchen gabapentin (NEURONTIN) 300 MG capsule Take 1 capsule (300 mg total) by mouth 3 (three) times daily.  90 capsule  2  . guaiFENesin (MUCINEX) 600 MG 12 hr tablet Take 1,200 mg by mouth 2 (two) times daily.      Marland Kitchen HYDROcodone-homatropine (HYCODAN) 5-1.5 MG/5ML syrup Take 5 mLs by mouth every 6 (six) hours as needed for cough.  240 mL  0  . lidocaine-prilocaine (EMLA) cream Apply 1 application topically as needed. Apply to [facility] A cath site 30-60 min before chemotherapy. Used every Wednesday      . loratadine (CLARITIN) 10 MG tablet Take 10 mg by  mouth daily.      Marland Kitchen LORazepam (ATIVAN) 0.5 MG tablet TAKE 1/2-1 TABLET BY MOUTH EVERY 8 HOURS AS NEEDED FOR NAUSEA  30 tablet  0  . ondansetron (ZOFRAN) 8 MG tablet TAKE 1 TAB BY MOUTH EVERY 8 HRS AS NEEDED FOR NAUSEA *FILL 09/11/12 INS LIMITS 21TABS PER 30DAYS *  21 tablet  0  . PACLitaxel-protein bound (ABRAXANE) 100 MG injection Inject 175 mg into the vein once.       . polyethylene glycol powder (GLYCOLAX/MIRALAX) powder Take 17 g by mouth daily.  255 g  0  . prochlorperazine (COMPAZINE) 10 MG tablet Take 10 mg by mouth every 6 (six) hours as needed.      . sucralfate (CARAFATE) 1 G tablet TAKE 1 TABLET (1 G TOTAL) BY MOUTH 4 (FOUR) TIMES DAILY. DISSOLVE IN 15 CC WATER.  120 tablet  2  . triamcinolone cream (KENALOG) 0.1 % Apply topically 2 (two) times daily.        SURGICAL HISTORY:    Past Surgical History  Procedure Date  . Tympanoplasty   . Video bronchoscopy 06/13/12    with endobronchial u/s/ with biospy left hilar mass/Dr. Lonia Farber  . Tonsillectomy     child    REVIEW OF SYSTEMS:  A comprehensive review of systems was negative except for: Constitutional: positive for fatigue   PHYSICAL EXAMINATION: General appearance: alert, cooperative, fatigued and no distress Head: Normocephalic, without obvious abnormality, atraumatic Neck: no adenopathy Lymph nodes: Cervical, supraclavicular, and axillary nodes normal. Resp: clear to auscultation bilaterally Cardio: regular rate and rhythm, S1, S2 normal, no murmur, click, rub or gallop GI: soft, non-tender; bowel sounds normal; no masses,  no organomegaly Extremities: extremities normal, atraumatic, no cyanosis or edema Neurologic: Alert and oriented X 3, normal strength and tone. Normal symmetric reflexes. Normal coordination and gait  ECOG PERFORMANCE STATUS: 1 - Symptomatic but completely ambulatory  Blood pressure 111/67, pulse 78, temperature 97.2 F (36.2 C), temperature source Oral, resp. rate 18, height 5\' 6"  (1.676 m), weight 171 lb (77.565 kg).  LABORATORY DATA: Lab Results  Component Value Date   WBC 3.8* 11/29/2012   HGB 10.5* 11/29/2012   HCT 29.3* 11/29/2012   MCV 101.5* 11/29/2012   PLT 192 11/29/2012      Chemistry      Component Value Date/Time   NA 141 11/29/2012 1340   NA 137 09/04/2012 2100   K 4.1 11/29/2012 1340   K 3.9 09/04/2012 2100   CL 106 11/29/2012 1340   CL 102 09/04/2012 2100   CO2 25 11/29/2012 1340   CO2 27 08/24/2012 1627   BUN 19.0 11/29/2012 1340   BUN 24* 09/04/2012 2100   CREATININE 1.3 11/29/2012 1340   CREATININE 1.20 09/04/2012 2100      Component Value Date/Time   CALCIUM 9.2 11/29/2012 1340   CALCIUM 9.2 08/24/2012 1627   ALKPHOS 117 11/29/2012 1340   ALKPHOS 95 08/24/2012 1627   AST 20 11/29/2012 1340   AST 25 08/24/2012 1627   ALT 25 11/29/2012 1340    ALT 41 08/24/2012 1627   BILITOT 0.53 11/29/2012 1340   BILITOT 0.6 08/24/2012 1627       RADIOGRAPHIC STUDIES: Ct Chest W Contrast  11/27/2012  *RADIOLOGY REPORT*  Clinical Data:  Lung cancer diagnosed in July 2013.  Ongoing chemotherapy.  CT CHEST, ABDOMEN AND PELVIS WITH CONTRAST  Technique:  Multidetector CT imaging of the chest, abdomen and pelvis was performed following the standard protocol during bolus  administration of intravenous contrast.  Contrast: OMNIPAQUE IOHEXOL 300 MG/ML  SOLN  Comparison:  CT 09/13/2012   CT CHEST  Findings:  There is a port in the right anterior chest wall.  No axillary or supraclavicular lymphadenopathy.  No mediastinal or hilar lymphadenopathy.  There is small pericardial effusion present.  There is thickening of the distal esophagus which is circumferential (image 40).  Review of the lung parenchyma demonstrates central lobular emphysema.  There is a reticular linear pattern in the left upper lobe unchanged.  This is at site of prior perihilar hypermetabolic mass.  There is mild thickening the posterior to the distal right to left mainstem bronchus measuring 19 x 16 mm in similar to 22 x 14 mm on prior for no significant change.  IMPRESSION:  1.  Mild thickening in the left hilum is unchanged from prior. This is site of previous large hypermetabolic mass. 2.  No evidence of disease progression in the thorax.  3.  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   CT ABDOMEN AND PELVIS  Findings:  There multiple low-density lesions within the liver which are not changed in size or number compared to prior most consistent with hepatic cysts.  The pancreas, spleen, adrenal glands and kidneys are stable.  Evidence of right adrenal recurrence.  The stomach is normal.  Hiatal hernia is present.  The small bowel, appendix, and cecum are normal.  Colon rectosigmoid colon are normal.  Abdominal aorta is normal caliber.  No retroperitoneal  periportal lymphadenopathy.  No free fluid the pelvis.  Prostate gland bladder normal.  No pelvic lymphadenopathy. Review of  bone windows demonstrates no aggressive osseous lesions.  IMPRESSION:  1.  No evidence of metastatic disease  within the abdomen pelvis. 2.  No evidence of adrenal gland metastatic recurrence. 3.  Stable low density hepatic lesions likely represent cysts.   Original Report Authenticated By: Genevive Bi, M.D.     ASSESSMENT: This is a very pleasant 62 years old white male with metastatic non-small cell lung cancer, squamous cell carcinoma status post palliative radiotherapy to the left hilar mass followed by 5 cycles of systemic chemotherapy with carboplatin and Abraxane. The patient had stable disease after cycle #5.   PLAN: I discussed the scan results with the patient and his family. I recommended for him to continue on observation for now with repeat CT scan of the chest, abdomen and pelvis in 3 months. He was advised to call me immediately if he has any concerning symptoms in the interval.  All questions were answered. The patient knows to call the clinic with any problems, questions or concerns. We can certainly see the patient much sooner if necessary.

## 2012-11-30 ENCOUNTER — Ambulatory Visit: Payer: BC Managed Care – PPO

## 2012-12-07 ENCOUNTER — Telehealth: Payer: Self-pay | Admitting: Medical Oncology

## 2012-12-07 NOTE — Telephone Encounter (Signed)
I instructed wife to get an appointment with PCP today and to call me back with update. Recent note faxed to [name] , PA-C

## 2012-12-08 NOTE — Telephone Encounter (Signed)
Derrick Crosby reported that patient was started on doxycycline, and eye cream and eye drops by his PCP.Marland Kitchen

## 2012-12-14 ENCOUNTER — Telehealth: Payer: Self-pay | Admitting: Medical Oncology

## 2012-12-14 NOTE — Telephone Encounter (Signed)
Really tired, sleeps a lot , very quiet. I told  [name] to have pt follow up with PCP.

## 2013-01-05 ENCOUNTER — Telehealth: Payer: Self-pay | Admitting: *Deleted

## 2013-01-05 NOTE — Telephone Encounter (Signed)
Pt's wife called stating that pt's arms and legs are hurting.  She states this is "not new and has been going on for a while".  Advised that pt see PCP.  She verbalized understanding.  SLJ

## 2013-01-06 DIAGNOSIS — C7931 Secondary malignant neoplasm of brain: Secondary | ICD-10-CM

## 2013-01-06 HISTORY — DX: Secondary malignant neoplasm of brain: C79.31

## 2013-01-08 ENCOUNTER — Telehealth: Payer: Self-pay | Admitting: Medical Oncology

## 2013-01-08 NOTE — Telephone Encounter (Signed)
Right shoulder and neck pain x 2 weeks . Pt saw Dr [name] who prescribed medrol dose pack and cxr. Wife asking if okay for pt to take medrol dose pack per PCP. I told her it was okay .She will take him to get cxr in am.

## 2013-01-14 ENCOUNTER — Other Ambulatory Visit: Payer: Self-pay | Admitting: Internal Medicine

## 2013-01-15 ENCOUNTER — Telehealth: Payer: Self-pay | Admitting: Medical Oncology

## 2013-01-15 NOTE — Telephone Encounter (Signed)
Called in refill for ativan with rx dated 3/6-. On 3/6 Dr Arbutus Ped  authorized refill but rx printed so i called it in today.

## 2013-01-19 ENCOUNTER — Telehealth: Payer: Self-pay | Admitting: *Deleted

## 2013-01-19 ENCOUNTER — Other Ambulatory Visit: Payer: Self-pay | Admitting: Physician Assistant

## 2013-01-19 NOTE — Telephone Encounter (Signed)
PT. IS STILL HAVING PAIN AND NUMBNESS IN HIS RIGHT HAND. HE IS SCHEDULED FOR CT SCANS ON 02/28/13. WOULD DR.MOHAMED WANT TO INCLUDE A CT BRAIN OR HAVE PT.'S PRIMARY CARE PHYSICIAN ORDER THE CT BRAIN? PLEASE INFORM KRISTEN OF DR.MOHAMED'S DECISION. THIS NOTE TO DR.MOHAMED'S NURSE, STEPHANIE JOHNSON,RN.

## 2013-01-22 ENCOUNTER — Encounter (HOSPITAL_COMMUNITY): Payer: Self-pay | Admitting: Nurse Practitioner

## 2013-01-22 ENCOUNTER — Inpatient Hospital Stay (HOSPITAL_COMMUNITY)
Admission: EM | Admit: 2013-01-22 | Discharge: 2013-01-29 | DRG: 533 | Disposition: A | Discharge status: Skilled Nursing Facility | Payer: BC Managed Care – PPO | Attending: Internal Medicine | Admitting: Internal Medicine

## 2013-01-22 ENCOUNTER — Emergency Department (HOSPITAL_COMMUNITY): Payer: BC Managed Care – PPO

## 2013-01-22 ENCOUNTER — Inpatient Hospital Stay (HOSPITAL_COMMUNITY): Payer: BC Managed Care – PPO

## 2013-01-22 ENCOUNTER — Telehealth: Payer: Self-pay | Admitting: Medical Oncology

## 2013-01-22 DIAGNOSIS — J4489 Other specified chronic obstructive pulmonary disease: Secondary | ICD-10-CM | POA: Diagnosis present

## 2013-01-22 DIAGNOSIS — F419 Anxiety disorder, unspecified: Secondary | ICD-10-CM

## 2013-01-22 DIAGNOSIS — Z87891 Personal history of nicotine dependence: Secondary | ICD-10-CM

## 2013-01-22 DIAGNOSIS — C3400 Malignant neoplasm of unspecified main bronchus: Secondary | ICD-10-CM | POA: Diagnosis present

## 2013-01-22 DIAGNOSIS — L089 Local infection of the skin and subcutaneous tissue, unspecified: Secondary | ICD-10-CM

## 2013-01-22 DIAGNOSIS — J309 Allergic rhinitis, unspecified: Secondary | ICD-10-CM | POA: Diagnosis present

## 2013-01-22 DIAGNOSIS — Z9089 Acquired absence of other organs: Secondary | ICD-10-CM

## 2013-01-22 DIAGNOSIS — R569 Unspecified convulsions: Secondary | ICD-10-CM | POA: Diagnosis not present

## 2013-01-22 DIAGNOSIS — Z9221 Personal history of antineoplastic chemotherapy: Secondary | ICD-10-CM

## 2013-01-22 DIAGNOSIS — F32A Depression, unspecified: Secondary | ICD-10-CM | POA: Diagnosis present

## 2013-01-22 DIAGNOSIS — C3490 Malignant neoplasm of unspecified part of unspecified bronchus or lung: Secondary | ICD-10-CM | POA: Diagnosis present

## 2013-01-22 DIAGNOSIS — R042 Hemoptysis: Secondary | ICD-10-CM

## 2013-01-22 DIAGNOSIS — G8320 Monoplegia of upper limb affecting unspecified side: Secondary | ICD-10-CM | POA: Diagnosis present

## 2013-01-22 DIAGNOSIS — B370 Candidal stomatitis: Secondary | ICD-10-CM | POA: Diagnosis present

## 2013-01-22 DIAGNOSIS — Z923 Personal history of irradiation: Secondary | ICD-10-CM

## 2013-01-22 DIAGNOSIS — J449 Chronic obstructive pulmonary disease, unspecified: Secondary | ICD-10-CM | POA: Diagnosis present

## 2013-01-22 DIAGNOSIS — Z79899 Other long term (current) drug therapy: Secondary | ICD-10-CM

## 2013-01-22 DIAGNOSIS — Z85828 Personal history of other malignant neoplasm of skin: Secondary | ICD-10-CM

## 2013-01-22 DIAGNOSIS — I10 Essential (primary) hypertension: Secondary | ICD-10-CM | POA: Diagnosis present

## 2013-01-22 DIAGNOSIS — R1319 Other dysphagia: Secondary | ICD-10-CM | POA: Diagnosis present

## 2013-01-22 DIAGNOSIS — C7931 Secondary malignant neoplasm of brain: Secondary | ICD-10-CM | POA: Diagnosis present

## 2013-01-22 DIAGNOSIS — R05 Cough: Secondary | ICD-10-CM

## 2013-01-22 DIAGNOSIS — B3781 Candidal esophagitis: Secondary | ICD-10-CM | POA: Diagnosis present

## 2013-01-22 DIAGNOSIS — G629 Polyneuropathy, unspecified: Secondary | ICD-10-CM

## 2013-01-22 DIAGNOSIS — D72829 Elevated white blood cell count, unspecified: Secondary | ICD-10-CM | POA: Diagnosis present

## 2013-01-22 DIAGNOSIS — J988 Other specified respiratory disorders: Secondary | ICD-10-CM

## 2013-01-22 DIAGNOSIS — F341 Dysthymic disorder: Secondary | ICD-10-CM | POA: Diagnosis present

## 2013-01-22 DIAGNOSIS — Z7982 Long term (current) use of aspirin: Secondary | ICD-10-CM

## 2013-01-22 DIAGNOSIS — E781 Pure hyperglyceridemia: Secondary | ICD-10-CM | POA: Diagnosis present

## 2013-01-22 DIAGNOSIS — T380X5A Adverse effect of glucocorticoids and synthetic analogues, initial encounter: Secondary | ICD-10-CM | POA: Diagnosis not present

## 2013-01-22 DIAGNOSIS — C7949 Secondary malignant neoplasm of other parts of nervous system: Secondary | ICD-10-CM

## 2013-01-22 DIAGNOSIS — F329 Major depressive disorder, single episode, unspecified: Secondary | ICD-10-CM

## 2013-01-22 LAB — COMPREHENSIVE METABOLIC PANEL
ALT: 40 U/L (ref 0–53)
AST: 27 U/L (ref 0–37)
Alkaline Phosphatase: 116 U/L (ref 39–117)
CO2: 25 mEq/L (ref 19–32)
Calcium: 9.9 mg/dL (ref 8.4–10.5)
Chloride: 101 mEq/L (ref 96–112)
GFR calc Af Amer: 86 mL/min — ABNORMAL LOW (ref >90)
GFR calc non Af Amer: 74 mL/min — ABNORMAL LOW (ref >90)
Glucose, Bld: 98 mg/dL (ref 70–99)
Sodium: 137 mEq/L (ref 135–145)
Total Bilirubin: 0.4 mg/dL (ref 0.3–1.2)

## 2013-01-22 LAB — CBC WITH DIFFERENTIAL/PLATELET
Eosinophils Relative: 2 % (ref 0–5)
HCT: 39.6 % (ref 39.0–52.0)
Lymphocytes Relative: 20 % (ref 12–46)
Lymphs Abs: 1.7 10*3/uL (ref 0.7–4.0)
MCV: 93.6 fL (ref 78.0–100.0)
Monocytes Absolute: 0.7 10*3/uL (ref 0.1–1.0)
Neutro Abs: 6 10*3/uL (ref 1.7–7.7)
Platelets: 179 10*3/uL (ref 150–400)
RBC: 4.23 MIL/uL (ref 4.22–5.81)
WBC: 8.5 10*3/uL (ref 4.0–10.5)

## 2013-01-22 MED ORDER — GUAIFENESIN ER 600 MG PO TB12
1200.0000 mg | ORAL_TABLET | Freq: Two times a day (BID) | ORAL | Status: DC
  Administered 2013-01-22 – 2013-01-29 (×11): 1200 mg via ORAL
  Filled 2013-01-22 (×15): qty 2

## 2013-01-22 MED ORDER — DEXAMETHASONE SODIUM PHOSPHATE 4 MG/ML IJ SOLN
6.0000 mg | Freq: Four times a day (QID) | INTRAMUSCULAR | Status: DC
  Administered 2013-01-23: 6 mg via INTRAVENOUS
  Filled 2013-01-22 (×7): qty 1.5

## 2013-01-22 MED ORDER — HYDROCODONE-HOMATROPINE 5-1.5 MG/5ML PO SYRP
5.0000 mL | ORAL_SOLUTION | Freq: Four times a day (QID) | ORAL | Status: DC | PRN
  Administered 2013-01-24: 5 mL via ORAL
  Filled 2013-01-22: qty 5

## 2013-01-22 MED ORDER — SODIUM CHLORIDE 0.9 % IJ SOLN
3.0000 mL | Freq: Two times a day (BID) | INTRAMUSCULAR | Status: DC
  Administered 2013-01-26 – 2013-01-28 (×4): 3 mL via INTRAVENOUS

## 2013-01-22 MED ORDER — ALBUTEROL SULFATE HFA 108 (90 BASE) MCG/ACT IN AERS
2.0000 | INHALATION_SPRAY | Freq: Four times a day (QID) | RESPIRATORY_TRACT | Status: DC | PRN
  Filled 2013-01-22: qty 6.7

## 2013-01-22 MED ORDER — OMEGA-3 FATTY ACIDS 1000 MG PO CAPS
1.0000 g | ORAL_CAPSULE | Freq: Every day | ORAL | Status: DC
  Administered 2013-01-22 – 2013-01-26 (×4): 1 g via ORAL
  Filled 2013-01-22 (×4): qty 1

## 2013-01-22 MED ORDER — ONDANSETRON HCL 4 MG/2ML IJ SOLN: 4.0000 mg | Freq: Four times a day (QID) | INTRAMUSCULAR | Status: DC | PRN

## 2013-01-22 MED ORDER — SODIUM CHLORIDE 0.9 % IJ SOLN: 3.0000 mL | INTRAMUSCULAR | Status: DC | PRN

## 2013-01-22 MED ORDER — CITALOPRAM HYDROBROMIDE 20 MG PO TABS
20.0000 mg | ORAL_TABLET | Freq: Every evening | ORAL | Status: DC
  Administered 2013-01-22 – 2013-01-26 (×5): 20 mg via ORAL
  Filled 2013-01-22 (×6): qty 1

## 2013-01-22 MED ORDER — DEXAMETHASONE SODIUM PHOSPHATE 4 MG/ML IJ SOLN
10.0000 mg | INTRAMUSCULAR | Status: AC
  Administered 2013-01-22: 10 mg via INTRAVENOUS

## 2013-01-22 MED ORDER — ACETAMINOPHEN 325 MG PO TABS
650.0000 mg | ORAL_TABLET | Freq: Four times a day (QID) | ORAL | Status: DC | PRN
Start: 2013-01-22 — End: 2013-01-27
  Administered 2013-01-23: 650 mg via ORAL
  Filled 2013-01-22: qty 2

## 2013-01-22 MED ORDER — ASPIRIN EC 81 MG PO TBEC
81.0000 mg | DELAYED_RELEASE_TABLET | Freq: Every day | ORAL | Status: DC
  Administered 2013-01-23 – 2013-01-29 (×6): 81 mg via ORAL
  Filled 2013-01-22 (×7): qty 1

## 2013-01-22 MED ORDER — ONDANSETRON HCL 4 MG PO TABS: 4.0000 mg | ORAL_TABLET | Freq: Four times a day (QID) | ORAL | Status: DC | PRN

## 2013-01-22 MED ORDER — LORATADINE 10 MG PO TABS
10.0000 mg | ORAL_TABLET | Freq: Every day | ORAL | Status: DC
  Administered 2013-01-22 – 2013-01-29 (×7): 10 mg via ORAL
  Filled 2013-01-22 (×8): qty 1

## 2013-01-22 MED ORDER — AMITRIPTYLINE HCL 25 MG PO TABS
25.0000 mg | ORAL_TABLET | Freq: Every day | ORAL | Status: DC
  Administered 2013-01-22 – 2013-01-26 (×5): 25 mg via ORAL
  Filled 2013-01-22 (×8): qty 1

## 2013-01-22 MED ORDER — SUCRALFATE 1 G PO TABS
1.0000 g | ORAL_TABLET | Freq: Four times a day (QID) | ORAL | Status: DC
  Administered 2013-01-22 – 2013-01-26 (×16): 1 g via ORAL
  Filled 2013-01-22 (×18): qty 1

## 2013-01-22 MED ORDER — SODIUM CHLORIDE 0.9 % IV SOLN
250.0000 mL | INTRAVENOUS | Status: DC | PRN
  Administered 2013-01-27: 250 mL via INTRAVENOUS

## 2013-01-22 MED ORDER — POLYETHYLENE GLYCOL 3350 17 G PO PACK
17.0000 g | PACK | Freq: Every day | ORAL | Status: DC
  Administered 2013-01-22 – 2013-01-29 (×7): 17 g via ORAL
  Filled 2013-01-22 (×8): qty 1

## 2013-01-22 MED ORDER — ENOXAPARIN SODIUM 40 MG/0.4ML ~~LOC~~ SOLN
40.0000 mg | SUBCUTANEOUS | Status: DC
  Administered 2013-01-22 – 2013-01-28 (×7): 40 mg via SUBCUTANEOUS
  Filled 2013-01-22 (×8): qty 0.4

## 2013-01-22 MED ORDER — TRIAMCINOLONE ACETONIDE 0.1 % EX CREA
1.0000 "application " | TOPICAL_CREAM | Freq: Two times a day (BID) | CUTANEOUS | Status: DC
  Administered 2013-01-22 – 2013-01-29 (×14): 1 via TOPICAL
  Filled 2013-01-22: qty 15

## 2013-01-22 MED ORDER — LIDOCAINE-PRILOCAINE 2.5-2.5 % EX CREA: 1.0000 "application " | TOPICAL_CREAM | CUTANEOUS | Status: DC | PRN

## 2013-01-22 MED ORDER — GABAPENTIN 300 MG PO CAPS
600.0000 mg | ORAL_CAPSULE | Freq: Three times a day (TID) | ORAL | Status: DC
  Administered 2013-01-22 – 2013-01-29 (×16): 600 mg via ORAL
  Filled 2013-01-22 (×23): qty 2

## 2013-01-22 MED ORDER — POLYETHYLENE GLYCOL 3350 17 GM/SCOOP PO POWD
17.0000 g | Freq: Every day | ORAL | Status: DC
  Filled 2013-01-22: qty 255

## 2013-01-22 MED ORDER — GADOBENATE DIMEGLUMINE 529 MG/ML IV SOLN
16.0000 mL | Freq: Once | INTRAVENOUS | Status: AC | PRN
  Administered 2013-01-22: 16 mL via INTRAVENOUS

## 2013-01-22 MED ORDER — ALUM & MAG HYDROXIDE-SIMETH 200-200-20 MG/5ML PO SUSP: 30.0000 mL | Freq: Four times a day (QID) | ORAL | Status: DC | PRN

## 2013-01-22 MED ORDER — CLONAZEPAM 0.5 MG PO TABS
0.5000 mg | ORAL_TABLET | Freq: Two times a day (BID) | ORAL | Status: DC | PRN
  Administered 2013-01-22 – 2013-01-25 (×5): 0.5 mg via ORAL
  Filled 2013-01-22 (×5): qty 1

## 2013-01-22 NOTE — Telephone Encounter (Signed)
Wife called to report she thinks pt had a stroke . He has numbness in his arms with problem moving his arms , He has fallen twice because he can't walk good. I instructed to her call 911 and get him to ED. She said she could get him there. I reiterated that she needs to call 911. Dr Arbutus Ped notified.

## 2013-01-22 NOTE — H&P (Signed)
[facility] History and Physical  Derrick Crosby ZOX:[id number] DOB: 07-22-1951 DOA: 01/22/2013  Referring physician: Dr. Estell Harpin PCP: [name], PA-C  Oncologist: Dr. [name]  Chief Complaint: Weakness.   History of Present Illness: Derrick Crosby is an 62 y.o. left handed male with a PMH of metastatic non-small cell lung cancer diagnosed 06/2012, under the care of Dr. Arbutus Ped, status post systemic chemotherapy and XRT who presents with a 2 week history of right upper and right lower extremity weakness with gait disturbance.  He cannot perform tasks that require fine motor coordination such as buttoning buttons.  He has had several falls at home.  He has not been able to move his right arm or hand for about 1 week.  No aggravating or alleviating factors.  Review of Systems: Constitutional: No fever, no chills;  Appetite normal; No weight loss, +weight gain.  HEENT: No blurry vision, no diplopia, no pharyngitis, no dysphagia CV: No chest pain, no palpitations.  Resp: + SOB, + dry cough. GI: No nausea, no vomiting, no diarrhea, no melena, no hematochezia.  GU: No dysuria, no hematuria.  MSK: + myalgias and arthralgias in arms and legs.  Neuro:  No headache, + right sided weakness and paresthesias to right arm/hand, no history of seizures.  Psych: + depression, no anxiety.  Endo: No thyroid disease, no DM, no heat intolerance, no cold intolerance, no polyuria, no polydipsia  Skin: No rashes, no skin lesions.  Heme: No easy bruising, no history of blood diseases.  Past Medical History Past Medical History  Diagnosis Date  . Hypertension   . Hyperglyceridemia, pure   . COPD (chronic obstructive pulmonary disease)   . Hemoptysis   . Anxiety   . Depression   . Hemoptysis   . Seasonal allergies   . History of radiation therapy 07/05/12-07/26/12    lung ca   . Cancer     Skin  Cancer - Left ear.-  . Skin cancer     left ear  . Lung cancer 06/13/12    lung mass /right hilar  suspicious for primary bronchogenic ca     Past Surgical History Past Surgical History  Procedure Laterality Date  . Tympanoplasty    . Video bronchoscopy  06/13/12    with endobronchial u/s/ with biospy left hilar mass/Dr. Lonia Farber  . Tonsillectomy      child     Social History: History   Social History  . Marital Status: Married    Spouse Name: [name]    Number of Children: 1  . Years of Education: N/A   Occupational History  . works in [employer] and [employer]  .     Social History Main Topics  . Smoking status: Former Smoker -- 0.50 packs/day for 50 years    Types: Cigarettes    Quit date: 06/14/2012  . Smokeless tobacco: Never Used     Comment: pt trying to quit  . Alcohol Use: No     Comment: occasionally beer   . Drug Use: No  . Sexually Active: No   Other Topics Concern  . Not on file   Social History Narrative   Married to [place] Fields.  Normally walks without assistance.    Family History:  Family History  Problem Relation Age of Onset  . Lung cancer Mother   . Cancer Mother     lung former smoker, age 109 now    Allergies: Review of patient's allergies indicates no  known allergies.  Meds: Prior to Admission medications   Medication Sig Start Date End Date Taking? Authorizing Provider  acetaminophen (TYLENOL) 325 MG tablet Take 650 mg by mouth every 6 (six) hours as needed. For general aches   Yes Historical Provider, MD  albuterol (PROVENTIL HFA;VENTOLIN HFA) 108 (90 BASE) MCG/ACT inhaler Inhale 2 puffs into the lungs every 6 (six) hours as needed for wheezing.    Yes Historical Provider, MD  amitriptyline (ELAVIL) 25 MG tablet Take 1 tablet (25 mg total) by mouth at bedtime. 11/22/12  Yes Conni Slipper, PA-C  aspirin EC 81 MG tablet Take 81 mg by mouth daily.   Yes Historical Provider, MD  citalopram (CELEXA) 20 MG tablet Take 20 mg by mouth every evening.    Yes Historical Provider, MD  clonazePAM (KLONOPIN) 0.5 MG  tablet Take 0.5 mg by mouth 2 (two) times daily as needed. For anxiety   Yes Historical Provider, MD  fish oil-omega-3 fatty acids 1000 MG capsule Take 1 g by mouth daily.    Yes Historical Provider, MD  gabapentin (NEURONTIN) 300 MG capsule Take 600 mg by mouth 3 (three) times daily.   Yes Historical Provider, MD  guaiFENesin (MUCINEX) 600 MG 12 hr tablet Take 1,200 mg by mouth 2 (two) times daily.   Yes Historical Provider, MD  HYDROcodone-homatropine (HYCODAN) 5-1.5 MG/5ML syrup Take 5 mLs by mouth every 6 (six) hours as needed for cough. 11/09/12  Yes Adrena E Johnson, PA-C  loratadine (CLARITIN) 10 MG tablet Take 10 mg by mouth daily.   Yes Historical Provider, MD  polyethylene glycol powder (GLYCOLAX/MIRALAX) powder Take 17 g by mouth daily. 09/04/12  Yes Arman Filter, NP  sucralfate (CARAFATE) 1 G tablet Take 1 g by mouth 4 (four) times daily. Dissolve in 15 CC of water   Yes Historical Provider, MD  lidocaine-prilocaine (EMLA) cream Apply 1 application topically as needed. Apply to [facility] A cath site 30-60 min before chemotherapy. Used every Wednesday 06/27/12 06/27/13  [name], MD  triamcinolone cream (KENALOG) 0.1 % Apply 1 application topically 2 (two) times daily. Apply to nose and face for dry skin    Historical Provider, MD    Physical Exam: Filed Vitals:   01/22/13 1108 01/22/13 1304  BP: 124/83 116/79  Pulse: 89 92  Temp: 98.2 F (36.8 C)   TempSrc: Oral   Resp: 18 18  SpO2: 98% 96%     Physical Exam: Blood pressure 116/79, pulse 92, temperature 98.2 F (36.8 C), temperature source Oral, resp. rate 18, SpO2 96.00%. Gen: No acute distress. Head: Normocephalic, atraumatic. Eyes: PERRL, EOMI, sclerae nonicteric. Mouth: Oropharynx clear. Edentulous on the upper jaw. Multiple missing teeth and poor dentition to the lower jaw. Neck: Supple, no thyromegaly, no lymphadenopathy, no jugular venous distention. Chest: Lungs clear to auscultation bilaterally. CV: Heart sounds  regular, without murmurs, rubs, or gallops. Abdomen: Soft, nontender, nondistended with normal active bowel sounds. Extremities: Extremities without clubbing, edema, or cyanosis. Skin: Warm and dry. No rashes. Neuro: Alert and oriented; cranial nerves II through XII grossly intact.  Flaccid paralysis right upper extremity. Good strength bilaterally to the lower extremities, gait unsteady per ED physician. Psych: Mood and affect normal.  Labs on Admission:  Basic Metabolic Panel:  Recent Labs Lab 01/22/13 1240  NA 137  K 4.1  CL 101  CO2 25  GLUCOSE 98  BUN 17  CREATININE 1.04  CALCIUM 9.9   Liver Function Tests:  Recent Labs Lab 01/22/13 1240  AST 27  ALT 40  ALKPHOS 116  BILITOT 0.4  PROT 7.2  ALBUMIN 3.7   CBC:  Recent Labs Lab 01/22/13 1240  WBC 8.5  NEUTROABS 6.0  HGB 13.8  HCT 39.6  MCV 93.6  PLT 179    Radiological Exams on Admission: Ct Head Wo Contrast  01/22/2013  *RADIOLOGY REPORT*  Clinical Data: Fall, right frontal abrasion, right arm/leg weakness x2 weeks  CT HEAD WITHOUT CONTRAST  Technique:  Contiguous axial images were obtained from the base of the skull through the vertex without contrast.  Comparison: MRI brain dated 06/22/2012  Findings: No evidence of parenchymal hemorrhage or extra-axial fluid collection.  Vasogenic edema in the subcortical left frontal lobe (series 2/image 21).  An underlying mass (possibly metastasis) is suspected.  Cerebral volume is age appropriate.  No ventriculomegaly.  The visualized paranasal sinuses are essentially clear. The mastoid air cells are unopacified.  No evidence of calvarial fracture.  No CT evidence of acute infarction.  IMPRESSION: Vasogenic edema in the subcortical left frontal lobe.  An underlying mass (possibly metastasis) is suspected.  Consider MRI brain without / with contrast for further evaluation.   Original Report Authenticated By: Charline Bills, M.D.     EKG: Independently reviewed. Sinus  rhythm at 76 beats per minute. No ST or T wave abnormalities appreciated.  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 Underlying metastatic lesion suspected. -MRI pending. -Will start on Decadron 10 mg x1 then 6 mg IV every 6 hours. -Will get radiation oncology consultation once metastasis confirmed. -PT/OT evaluations requested. Active Problems:   Depression with anxiety -Continue Celexa, Elavil, and Klonopin.   Squamous cell lung cancer -Will notify Dr. Arbutus Ped of the patient's admission.   COPD (chronic obstructive pulmonary disease) -Continue albuterol 2 puffs every 6 hours as needed for wheezing. Continue Mucinex and Hycodan for cough.  Code Status: Full. Family Communication: [name], wife, [phone] updated at bedside. Disposition Plan: Home when stable.  Time spent: 1 hour.  RAMA,CHRISTINA [facility] Pager [phone]  If 7PM-7AM, please contact night-coverage [email or web address] Password [facility] 01/22/2013, 3:39 PM

## 2013-01-22 NOTE — ED Notes (Addendum)
Pt has two areas of broken skin on right wrist and upper forearm.  Cleaned with NS and dressed with tegaderm.

## 2013-01-22 NOTE — ED Notes (Addendum)
Per pt's wife, the right arm and right arm weakness began about a week and 1/2 ago. Pt came in today because weakness is increasing (pt holding on to furniture to ambulate around the house).  Pt was seen by family provider [name], [place]) when symptoms first began.  A nerve ending test was performed and results were normal per pt's wife.  Pt's wife states that they have been waiting for a phone call to have an MRI in [place].  Pt lives in [place]. Fall band and socks placed on pt.

## 2013-01-22 NOTE — ED Provider Notes (Signed)
History     CSN: [id number]  Arrival date & time 01/22/13  1050   First MD Initiated Contact with Patient 01/22/13 1054      Chief Complaint  Patient presents with  . Extremity Weakness    right arm    (Consider location/radiation/quality/duration/timing/severity/associated sxs/prior treatment) Patient is a 62 y.o. male presenting with extremity weakness. The history is provided by the patient (pt has had weakness in the right arm and leg for one week.  getting worse.  pt not walking well). No language interpreter was used.  Extremity Weakness This is a new problem. The current episode started more than 1 week ago. The problem occurs constantly. The problem has not changed since onset.Pertinent negatives include no chest pain, no abdominal pain and no headaches. Nothing aggravates the symptoms. Nothing relieves the symptoms. The treatment provided moderate relief.    Past Medical History  Diagnosis Date  . Hypertension   . Hyperglyceridemia, pure   . COPD (chronic obstructive pulmonary disease)   . Hemoptysis   . Anxiety   . Depression   . Hemoptysis   . Seasonal allergies   . History of radiation therapy 07/05/12-07/26/12    lung ca   . Cancer     Skin  Cancer - Left ear.-  . Skin cancer     left ear  . Lung cancer 06/13/12    lung mass /right hilar suspicious for primary bronchogenic ca    Past Surgical History  Procedure Laterality Date  . Tympanoplasty    . Video bronchoscopy  06/13/12    with endobronchial u/s/ with biospy left hilar mass/Dr. Lonia Farber  . Tonsillectomy      child    Family History  Problem Relation Age of Onset  . Lung cancer Mother   . Cancer Mother     lung former smoker, age 61 now    History  Substance Use Topics  . Smoking status: Former Smoker -- 0.50 packs/day for 50 years    Types: Cigarettes    Quit date: 06/14/2012  . Smokeless tobacco: Never Used     Comment: pt trying to quit  . Alcohol Use: No     Comment:  occasionally beer       Review of Systems  Constitutional: Negative for fatigue.  HENT: Negative for congestion, sinus pressure and ear discharge.   Eyes: Negative for discharge.  Respiratory: Negative for cough.   Cardiovascular: Negative for chest pain.  Gastrointestinal: Negative for abdominal pain and diarrhea.  Genitourinary: Negative for frequency and hematuria.  Musculoskeletal: Positive for extremity weakness. Negative for back pain.       Weakness to right arm and right leg  Skin: Negative for rash.  Neurological: Negative for seizures and headaches.  Psychiatric/Behavioral: Negative for hallucinations.    Allergies  Review of patient's allergies indicates no known allergies.  Home Medications   Current Outpatient Rx  Name  Route  Sig  Dispense  Refill  . acetaminophen (TYLENOL) 325 MG tablet   Oral   Take 650 mg by mouth every 6 (six) hours as needed. For general aches         . albuterol (PROVENTIL HFA;VENTOLIN HFA) 108 (90 BASE) MCG/ACT inhaler   Inhalation   Inhale 2 puffs into the lungs every 6 (six) hours as needed for wheezing.          Marland Kitchen amitriptyline (ELAVIL) 25 MG tablet   Oral   Take 1 tablet (25 mg total) by mouth at  bedtime.   30 tablet   2   . aspirin EC 81 MG tablet   Oral   Take 81 mg by mouth daily.         . citalopram (CELEXA) 20 MG tablet   Oral   Take 20 mg by mouth every evening.          . clonazePAM (KLONOPIN) 0.5 MG tablet   Oral   Take 0.5 mg by mouth 2 (two) times daily as needed. For anxiety         . fish oil-omega-3 fatty acids 1000 MG capsule   Oral   Take 1 g by mouth daily.          Marland Kitchen gabapentin (NEURONTIN) 300 MG capsule   Oral   Take 600 mg by mouth 3 (three) times daily.         Marland Kitchen guaiFENesin (MUCINEX) 600 MG 12 hr tablet   Oral   Take 1,200 mg by mouth 2 (two) times daily.         Marland Kitchen HYDROcodone-homatropine (HYCODAN) 5-1.5 MG/5ML syrup   Oral   Take 5 mLs by mouth every 6 (six) hours as  needed for cough.   240 mL   0   . loratadine (CLARITIN) 10 MG tablet   Oral   Take 10 mg by mouth daily.         . polyethylene glycol powder (GLYCOLAX/MIRALAX) powder   Oral   Take 17 g by mouth daily.   255 g   0   . sucralfate (CARAFATE) 1 G tablet   Oral   Take 1 g by mouth 4 (four) times daily. Dissolve in 15 CC of water         . lidocaine-prilocaine (EMLA) cream   Topical   Apply 1 application topically as needed. Apply to [facility] A cath site 30-60 min before chemotherapy. Used every Wednesday         . triamcinolone cream (KENALOG) 0.1 %   Topical   Apply 1 application topically 2 (two) times daily. Apply to nose and face for dry skin           BP 116/79  Pulse 92  Temp(Src) 98.2 F (36.8 C) (Oral)  Resp 18  SpO2 96%  Physical Exam  Constitutional: He is oriented to person, place, and time. He appears well-developed.  HENT:  Head: Normocephalic and atraumatic.  Eyes: Conjunctivae and EOM are normal. No scleral icterus.  Neck: Neck supple. No thyromegaly present.  Cardiovascular: Normal rate and regular rhythm.  Exam reveals no gallop and no friction rub.   No murmur heard. Pulmonary/Chest: No stridor. He has no wheezes. He has no rales. He exhibits no tenderness.  Abdominal: He exhibits no distension. There is no tenderness. There is no rebound.  Musculoskeletal: He exhibits no edema.  Pt has minimal movement of right arm and profound weakness in right leg and is unsteady walking  Lymphadenopathy:    He has no cervical adenopathy.  Neurological: He is oriented to person, place, and time. Coordination normal.  Skin: No rash noted. No erythema.  Psychiatric: He has a normal mood and affect. His behavior is normal.    ED Course  Procedures (including critical care time)  Labs Reviewed  COMPREHENSIVE METABOLIC PANEL - Abnormal; Notable for the following:    GFR calc non Af Amer 74 (*)    GFR calc Af Amer 86 (*)    All other components within  normal limits  CBC WITH DIFFERENTIAL   Ct Head Wo Contrast  01/22/2013  *RADIOLOGY REPORT*  Clinical Data: Fall, right frontal abrasion, right arm/leg weakness x2 weeks  CT HEAD WITHOUT CONTRAST  Technique:  Contiguous axial images were obtained from the base of the skull through the vertex without contrast.  Comparison: MRI brain dated 06/22/2012  Findings: No evidence of parenchymal hemorrhage or extra-axial fluid collection.  Vasogenic edema in the subcortical left frontal lobe (series 2/image 21).  An underlying mass (possibly metastasis) is suspected.  Cerebral volume is age appropriate.  No ventriculomegaly.  The visualized paranasal sinuses are essentially clear. The mastoid air cells are unopacified.  No evidence of calvarial fracture.  No CT evidence of acute infarction.  IMPRESSION: Vasogenic edema in the subcortical left frontal lobe.  An underlying mass (possibly metastasis) is suspected.  Consider MRI brain without / with contrast for further evaluation.   Original Report Authenticated By: Charline Bills, M.D.      1. Weakness       MDM          Benny Lennert, MD 01/22/13 [phone]

## 2013-01-23 ENCOUNTER — Telehealth: Payer: Self-pay | Admitting: *Deleted

## 2013-01-23 ENCOUNTER — Ambulatory Visit
Admit: 2013-01-23 | Discharge: 2013-01-23 | Disposition: A | Discharge status: Home / Self Care | Payer: BC Managed Care – PPO | Attending: Radiation Oncology | Admitting: Radiation Oncology

## 2013-01-23 DIAGNOSIS — R569 Unspecified convulsions: Secondary | ICD-10-CM | POA: Diagnosis not present

## 2013-01-23 MED ORDER — DEXAMETHASONE SODIUM PHOSPHATE 10 MG/ML IJ SOLN
6.0000 mg | Freq: Four times a day (QID) | INTRAMUSCULAR | Status: DC
  Administered 2013-01-23 – 2013-01-27 (×18): 6 mg via INTRAVENOUS
  Administered 2013-01-27: 01:00:00 via INTRAVENOUS
  Administered 2013-01-28 – 2013-01-29 (×7): 6 mg via INTRAVENOUS
  Filled 2013-01-23 (×24): qty 0.6
  Filled 2013-01-23: qty 1
  Filled 2013-01-23 (×5): qty 0.6

## 2013-01-23 MED ORDER — SODIUM CHLORIDE 0.9 % IV SOLN
500.0000 mg | Freq: Two times a day (BID) | INTRAVENOUS | Status: DC
  Administered 2013-01-23 – 2013-01-26 (×7): 500 mg via INTRAVENOUS
  Filled 2013-01-23 (×8): qty 5

## 2013-01-23 MED ORDER — CHLORPROMAZINE HCL 10 MG PO TABS
10.0000 mg | ORAL_TABLET | Freq: Four times a day (QID) | ORAL | Status: DC | PRN
  Administered 2013-01-23: 10 mg via ORAL
  Filled 2013-01-23: qty 1

## 2013-01-23 MED ORDER — LORAZEPAM 2 MG/ML IJ SOLN
INTRAMUSCULAR | Status: AC
  Administered 2013-01-23: 2 mg via INTRAVENOUS
  Filled 2013-01-23: qty 1

## 2013-01-23 MED ORDER — CHLORHEXIDINE GLUCONATE CLOTH 2 % EX PADS
6.0000 | MEDICATED_PAD | Freq: Every day | CUTANEOUS | Status: AC
  Administered 2013-01-23 – 2013-01-27 (×5): 6 via TOPICAL

## 2013-01-23 MED ORDER — LORAZEPAM 2 MG/ML IJ SOLN: 2.0000 mg | INTRAMUSCULAR | Status: AC

## 2013-01-23 MED ORDER — LORAZEPAM 2 MG/ML IJ SOLN
1.0000 mg | INTRAMUSCULAR | Status: DC | PRN
  Administered 2013-01-26 – 2013-01-28 (×3): 1 mg via INTRAVENOUS
  Filled 2013-01-23 (×3): qty 1

## 2013-01-23 MED ORDER — PROMETHAZINE HCL 25 MG/ML IJ SOLN
12.5000 mg | INTRAMUSCULAR | Status: DC | PRN
  Administered 2013-01-23 – 2013-01-28 (×4): 12.5 mg via INTRAVENOUS
  Filled 2013-01-23 (×4): qty 1

## 2013-01-23 MED ORDER — MUPIROCIN 2 % EX OINT
1.0000 "application " | TOPICAL_OINTMENT | Freq: Two times a day (BID) | CUTANEOUS | Status: AC
  Administered 2013-01-23 – 2013-01-27 (×10): 1 via NASAL
  Filled 2013-01-23: qty 22

## 2013-01-23 MED ORDER — LORAZEPAM BOLUS VIA INFUSION: 1.0000 mg | INTRAVENOUS | Status: DC | PRN

## 2013-01-23 NOTE — Progress Notes (Addendum)
[facility] PROGRESS NOTE  Derrick Crosby GNF:[id number] DOB: 12-Oct-1951 DOA: 01/22/2013 PCP: Arlyss Queen  Brief narrative: Derrick Crosby is an 62 y.o. left handed male with a PMH of metastatic non-small cell lung cancer diagnosed 06/2012, under the care of Dr. Arbutus Ped, status post systemic chemotherapy and XRT who was admitted 01/22/2013 with a two-week history of progressive weakness progressing to paralysis of his right upper arm and hand. 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 Radiation oncology has been consulted and the patient has been placed on Decadron. He also developed the acute onset of a tonic clonic seizure on 01/23/2013.  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 Underlying metastatic lesion suspected.  -MRI confirms solitary metastasis. Radiation oncology consulted for treatment. -Continue Decadron 6 mg IV every 6 hours.  -PT/OT evaluations performed 01/23/2013: CIR versus SNF recommended. Active Problems:  Tonic clonic seizure -Patient developed an acute tonic-clonic seizure on 01/23/2013. Given 2 mg of Ativan. -Keppra 500 mg IV every 12 hours started for seizure prophylaxis. Depression with anxiety  -Continue Celexa, Elavil, and Klonopin.  Squamous cell lung cancer  -Dr. Arbutus Ped aware of the patient's admission.  COPD (chronic obstructive pulmonary disease)  -Continue albuterol 2 puffs every 6 hours as needed for wheezing. Continue Mucinex and Hycodan for cough.  Code Status: Full. Family Communication: [name], wife, [phone] updated at bedside. Disposition Plan: CIR versus SNF.   Medical Consultants:  Radiation oncology  Other Consultants:  Physical therapy  Occupational  therapy  Anti-infectives:  None.  HPI/Subjective: Derrick Crosby is without complaints of dyspnea, cough, or pain. His appetite is good. His bowels are moving. No change in flaccid paralysis to his right arm and hand.  Objective: Filed Vitals:   01/22/13 1530 01/22/13 1700 01/22/13 2100 01/23/13 0606  BP: 115/84 131/71 127/88 118/83  Pulse: 79 92 80 88  Temp:  98.3 F (36.8 C) 98.5 F (36.9 C) 97.8 F (36.6 C)  TempSrc:   Oral Oral  Resp: 20 18 16 18   Height:  5\' 9"  (1.753 m)    Weight:  76.204 kg (168 lb)    SpO2: 95% 97% 96% 93%    Intake/Output Summary (Last 24 hours) at 01/23/13 1053 Last data filed at 01/23/13 1000  Gross per 24 hour  Intake    800 ml  Output   1400 ml  Net   -600 ml    Exam: Gen:  NAD Cardiovascular:  RRR, No M/R/G Respiratory:  Lungs CTAB Gastrointestinal:  Abdomen soft, NT/ND, + BS Extremities:  No C/E/C Neurological: No change to flaccid paralysis right upper extremity and hand  Data Reviewed: Basic Metabolic Panel:  Recent Labs Lab 01/22/13 1240  NA 137  K 4.1  CL 101  CO2 25  GLUCOSE 98  BUN 17  CREATININE 1.04  CALCIUM 9.9   GFR Estimated Creatinine Clearance: 71.8 ml/min (by C-G formula based on Cr of 1.04). Liver Function Tests:  Recent Labs Lab 01/22/13 1240  AST 27  ALT 40  ALKPHOS 116  BILITOT 0.4  PROT 7.2  ALBUMIN 3.7   CBC:  Recent Labs Lab 01/22/13 1240  WBC 8.5  NEUTROABS 6.0  HGB 13.8  HCT 39.6  MCV 93.6  PLT 179   Microbiology Recent Results (from the past 240 hour(s))  MRSA PCR  SCREENING     Status: Abnormal   Collection Time    01/22/13  9:43 PM      Result Value Range Status   MRSA by PCR POSITIVE (*) NEGATIVE Final   Comment:            The GeneXpert MRSA Assay (FDA     approved for NASAL specimens     only), is one component of a     comprehensive MRSA colonization     surveillance program. It is not     intended to diagnose MRSA     infection nor to guide or     monitor  treatment for     MRSA infections.     RESULT CALLED TO, READ BACK BY AND VERIFIED WITH:     L SPENCER AT 0214 ON 03.18.2014 BY NBROOKS     Procedures and Diagnostic Studies: Ct Head Wo Contrast  01/22/2013  *RADIOLOGY REPORT*  Clinical Data: Fall, right frontal abrasion, right arm/leg weakness x2 weeks  CT HEAD WITHOUT CONTRAST  Technique:  Contiguous axial images were obtained from the base of the skull through the vertex without contrast.  Comparison: MRI brain dated 06/22/2012  Findings: No evidence of parenchymal hemorrhage or extra-axial fluid collection.  Vasogenic edema in the subcortical left frontal lobe (series 2/image 21).  An underlying mass (possibly metastasis) is suspected.  Cerebral volume is age appropriate.  No ventriculomegaly.  The visualized paranasal sinuses are essentially clear. The mastoid air cells are unopacified.  No evidence of calvarial fracture.  No CT evidence of acute infarction.  IMPRESSION: Vasogenic edema in the subcortical left frontal lobe.  An underlying mass (possibly metastasis) is suspected.  Consider MRI brain without / with contrast for further evaluation.   Original Report Authenticated By: Charline Bills, M.D.    Mr Laqueta Jean Wo Contrast  01/22/2013  *RADIOLOGY REPORT*  Clinical Data: 2-week history of right upper extremity weakness. Also right lower extremity weakness with gait disturbance.  History of metastatic non-small cell lung cancer diagnosed August 2013.  MRI HEAD WITHOUT AND WITH CONTRAST  Technique:  Multiplanar, multiecho pulse sequences of the brain and surrounding structures were obtained according to standard protocol without and with intravenous contrast  Contrast: 16mL MULTIHANCE GADOBENATE DIMEGLUMINE 529 MG/ML IV SOLN  Comparison: CT scan earlier today.  Prior MRI brain 06/22/2012.  Findings:  There is a 28 x 31 x 30 mm apparent solitary intra-axial lesion to the left posterior frontal cortex and subcortical white matter.  Moderate  surrounding vasogenic edema noted.  The lesion itself shows moderate restricted diffusion.  No significant hemorrhage.  No midline shift.  No other visible intracranial lesions. In the setting of a known lung cancer with known metastatic disease, this lesion likely represents a metastasis. Primary brain tumor or abscess felt unlikely.  Slight age related atrophy.  Minimal periventricular T2 signal prolongation, likely incidental chronic microvascular ischemic change.  No hydrocephalus.  No osseous lesions.  Mild pannus. Normal pituitary and cerebellar tonsils.  Major intracranial vascular structures patent.  Mild chronic sinus disease.  Negative orbits and mastoids.  IMPRESSION: Findings consistent with a 28 x 31 x 30 mm apparent solitary intra- axial metastasis to the left posterior frontal cortex and subcortical white matter.  Moderate surrounding vasogenic edema without midline shift.   Original Report Authenticated By: Davonna Belling, M.D.     Scheduled Meds: . amitriptyline  25 mg Oral QHS  . aspirin EC  81 mg Oral Daily  . Chlorhexidine  Gluconate Cloth  6 each Topical [id number]  . citalopram  20 mg Oral QPM  . dexamethasone  6 mg Intravenous Q6H  . enoxaparin (LOVENOX) injection  40 mg Subcutaneous Q24H  . fish oil-omega-3 fatty acids  1 g Oral Daily  . gabapentin  600 mg Oral TID  . guaiFENesin  1,200 mg Oral BID  . loratadine  10 mg Oral Daily  . mupirocin ointment  1 application Nasal BID  . polyethylene glycol  17 g Oral Daily  . sodium chloride  3 mL Intravenous Q12H  . sucralfate  1 g Oral QID  . triamcinolone cream  1 application Topical BID   Continuous Infusions:   Time spent: 35 minutes.   LOS: 1 day   RAMA,CHRISTINA  [facility] Pager [phone].  If 8PM-8AM, please contact night-coverage at [email or web address], password [facility] 01/23/2013, 10:53 AM

## 2013-01-23 NOTE — Evaluation (Signed)
Physical Therapy Evaluation Patient Details Name: Derrick Crosby MRN: [id number] DOB: 01-15-1951 Today's Date: 01/23/2013 Time: 4098-1191 PT Time Calculation (min): 30 min  PT Assessment / Plan / Recommendation Clinical Impression  Pt. is 62 yo male admitted 3/17 with 2 week h/o R sided weakness, increased falls, decreased functional mobility. Pt. found with metastatic NSCLC with lesion in L posterior frontal cortex.  Pt. presents with RUE with less function than LE although pt. does present with decreased contol of RLE during gait. Pt. will benefit from PT while in acute care. Recommend post acute rehab at this time.    PT Assessment  Patient needs continued PT services    Follow Up Recommendations  CIR;SNF    Does the patient have the potential to tolerate intense rehabilitation      Barriers to Discharge        Equipment Recommendations  None recommended by PT    Recommendations for Other Services Rehab consult   Frequency Min 3X/week    Precautions / Restrictions Precautions Precautions: Fall Precaution Comments: impulsive Restrictions Weight Bearing Restrictions: No   Pertinent Vitals/Pain C/o HA, RN notified.       Mobility  Bed Mobility Bed Mobility: Rolling Left;Rolling Right;Right Sidelying to Sit;Sitting - Scoot to [place] of Bed Rolling Right: 4: Min guard Rolling Left: 5: Supervision Right Sidelying to Sit: 4: Min guard Sitting - Scoot to [place] of Bed: 4: Min guard Details for Bed Mobility Assistance: cues to hold RUE during roll to L, cues for safety. Pt sat up with a lurch  to get up right. scoots with LUE assisting. Transfers Transfers: Sit to Stand;Stand to Sit Sit to Stand: 3: Mod assist;From bed;With upper extremity assist;From elevated surface Stand to Sit: 4: Min assist;With armrests Details for Transfer Assistance: tactile cues to push from Bed with LUE and to hold RW with RUE> Mod. Assistance need to place R hand on RW. tactile cues to let R hand  go of RW and guidance to reach R hand to recliner although pt was not attending to RUE at the time. Ambulation/Gait Ambulation/Gait Assistance: 1: +2 Total assist Ambulation/Gait: Patient Percentage: 60% Ambulation Distance (Feet): 50 Feet Assistive device: Rolling walker Ambulation/Gait Assistance Details: costant assistance for R hand to stay gripped on RW. Pt. is unable to steer RW on R side, therefore RW veer to L and pt frequently kicked back post with R foot., Pt able to swing  R leg during gait with decreased control, at times tendency to adduct,.  Gait Pattern: Step-through pattern;Decreased stance time - right;Decreased weight shift to right;Decreased dorsiflexion - right;Right circumduction;Right genu recurvatum;Decreased trunk rotation Gait velocity: decreased    Exercises General Exercises - Lower Extremity Long Arc Quad: AROM;Both;5 reps;Seated   PT Diagnosis: Abnormality of gait;Generalized weakness (hemiparesis dominant.)  PT Problem List: Decreased strength;Decreased range of motion;Decreased activity tolerance;Decreased balance;Decreased mobility;Decreased coordination;Decreased cognition;Decreased knowledge of use of DME;Pain;Decreased safety awareness;Decreased knowledge of precautions;Impaired sensation PT Treatment Interventions: DME instruction;Gait training;Functional mobility training;Therapeutic activities;Therapeutic exercise;Balance training;Patient/family education;Neuromuscular re-education   PT Goals Acute Rehab PT Goals PT Goal Formulation: With patient/family Time For Goal Achievement: 02/06/13 Potential to Achieve Goals: Good Pt will go Supine/Side to Sit: Independently PT Goal: Supine/Side to Sit - Progress: Goal set today Pt will go Sit to Supine/Side: Independently PT Goal: Sit to Supine/Side - Progress: Goal set today Pt will go Sit to Stand: with supervision PT Goal: Sit to Stand - Progress: Goal set today Pt will go Stand to Sit: with supervision  PT  Goal: Stand to Sit - Progress: Goal set today Pt will Stand: with supervision;3 - 5 min;with no upper extremity support PT Goal: Stand - Progress: Goal set today Pt will Ambulate: >150 feet;with supervision;with least restrictive assistive device PT Goal: Ambulate - Progress: Goal set today Pt will Perform Home Exercise Program: with supervision, verbal cues required/provided PT Goal: Perform Home Exercise Program - Progress: Goal set today  Visit Information  Last PT Received On: 01/23/13 Assistance Needed: +2    Subjective Data  Subjective: I am ready to walk.` Patient Stated Goal: same., to get better.   Prior Functioning  Home Living Lives With: Spouse Type of Home: Mobile home Home Access: Stairs to enter Entrance Stairs-Number of Steps: 4 Entrance Stairs-Rails: None Home Layout: One level Bathroom Shower/Tub: Engineer, manufacturing systems: Standard Home Adaptive Equipment: Environmental consultant - rolling;Walker - four wheeled;Bedside commode/3-in-1 Additional Comments: per wife , pt. takes a tub bath but last time had very much difficulty getting out. Prior Function Level of Independence: Needs assistance Needs Assistance: Bathing;Feeding;Grooming;Gait;Transfers Gait Assistance: recently has held onto furniture, has had falls. Driving: No Communication Communication: No difficulties Dominant Hand: Right    Cognition  Cognition Overall Cognitive Status: Impaired Area of Impairment: Memory;Following commands;Safety/judgement;Awareness of errors;Awareness of deficits;Problem solving Arousal/Alertness: Awake/alert Orientation Level: Disoriented to;Time;Situation (stated hospital when prompted.) Behavior During Session: Restless Following Commands: Follows one step commands inconsistently Safety/Judgement: Decreased awareness of safety precautions;Decreased safety judgement for tasks assessed;Decreased awareness of need for assistance;Impulsive Problem Solving: requires repeated  instructions  for activity, reminders to slow down.    Extremity/Trunk Assessment Right Lower Extremity Assessment RLE ROM/Strength/Tone: Deficits RLE ROM/Strength/Tone Deficits: avtive movement throughout against gravity: knee extension 3, hip flexion 2+, abd 3. RLE Sensation: WFL - Light Touch RLE Coordination: Deficits RLE Coordination Deficits: decreased speed of repetitive/reciprocal movements. Left Lower Extremity Assessment LLE ROM/Strength/Tone: WFL for tasks assessed LLE Sensation: WFL - Light Touch   Balance Balance Balance Assessed: Yes Static Sitting Balance Static Sitting - Balance Support: Left upper extremity supported Static Sitting - Level of Assistance: 5: Stand by assistance  End of Session PT - End of Session Equipment Utilized During Treatment: Gait belt Activity Tolerance: Patient tolerated treatment well Patient left: in chair;with call bell/phone within reach;with family/visitor present Nurse Communication: Mobility status  GP     Rada Hay 01/23/2013, 10:08 AM [phone]

## 2013-01-23 NOTE — Progress Notes (Signed)
At 1115 called to room by pts. Wife. Pt. In reclining chair w/active seizure approxamat  duration of 3 min. Dr. Darnelle Catalan called by NS & was present in room prior to end of seizure activity. Order given for Ativan 2 mg IV stat - given by J. [employer] fundraiser. Padding to bed siderails. Chaplain present & supportive to wife. Pt. Returned to bed. Alert, talking & Oriented x 4. Will continue to observe.Derrick Crosby

## 2013-01-23 NOTE — Progress Notes (Signed)
Pt's wife informed this RN that pt was being treated with Doxycycline in the past week for positive MRSA. Pt's wife did not bring the bottle with her, and it isn't listed on PTA Med list. Pt has been placed on Contact Isolation for hx and MRSA swab results are pending.

## 2013-01-23 NOTE — Telephone Encounter (Signed)
Spoke w/Sara, RN for pt in room 1332 and informed her Dr Michell Heinrich will be up to see pt this evening re: discuss radiation ct sim and treatment. Informed her pt is scheduled for ct sim at 8:00 am on 01/24/13. She reports pt had seizure today at 11am which was relieved w/Ativan. Pt now on Keppra as well as Decadron q 6 hrs. Pt alert, oriented today except during sz. Pt has right sided weakness, high fall risk. Pt on contact precautions for MRSA, denies pain. Pt has right chest port which is accessed.  All above information verbally given to [place], RT in ct sim. She will be involved in transporting pt to rad onc for ct sim.

## 2013-01-23 NOTE — Progress Notes (Signed)
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   PRIOR THERAPY:  1) status post palliative radiotherapy to the left hilar mass under the care of Dr. Mitzi Hansen.  2) Systemic chemotherapy with carboplatin for AUC of 5 on day 1 and Abraxane 100 mg/M2 on days 1, 8 and 15 every 3 weeks. The patient is status post 5 cycles, last dose was given 11/09/2012 with partial response.   CURRENT THERAPY: Observation.   Subjective: The patient is seen and examined today. His wife was at the bedside. He is feeling a little bit better today but continues to have weakness in the right upper and lower extremities. He was admitted yesterday with weakness in the right upper and lower extremity and gait disturbance as well as lack of motor coordination. CT scan of the head followed by MRI of the brain showed 2.8 x 3.1 x 3.0 CM apparent solitary intra-axial metastasis to the left posterior frontal cortex and subcortical white matter with moderate surrounding vasogenic edema without midline shift. He denied having any other significant complaints except for mild headache.   Objective: Vital signs in last 24 hours: Temp:  [97.8 F (36.6 C)-99.7 F (37.6 C)] 99.7 F (37.6 C) (03/18 1326) Pulse Rate:  [80-128] 114 (03/18 1326) Resp:  [16-24] 18 (03/18 1326) BP: (111-131)/(71-88) 128/79 mmHg (03/18 1326) SpO2:  [92 %-97 %] 92 % (03/18 1326) Weight:  [168 lb (76.204 kg)] 168 lb (76.204 kg) (03/17 1700)  Intake/Output from previous day: 03/17 0701 - 03/18 0700 In: 800 [P.O.:480; I.V.:320] Out: 1150 [Urine:1150] Intake/Output this shift: Total I/O In: -  Out: 250 [Urine:250]  General appearance: alert, cooperative and no distress Resp: clear to auscultation bilaterally Cardio: regular rate and rhythm, S1, S2 normal, no murmur, click, rub or gallop GI: soft, non-tender; bowel sounds normal; no masses,  no organomegaly Extremities: extremities normal,  atraumatic, no cyanosis or edema 2/5 muscle strength in the right upper and lower extremities.  Lab Results:   Recent Labs  01/22/13 1240  WBC 8.5  HGB 13.8  HCT 39.6  PLT 179   BMET  Recent Labs  01/22/13 1240  NA 137  K 4.1  CL 101  CO2 25  GLUCOSE 98  BUN 17  CREATININE 1.04  CALCIUM 9.9    Studies/Results: Ct Head Wo Contrast  01/22/2013  *RADIOLOGY REPORT*  Clinical Data: Fall, right frontal abrasion, right arm/leg weakness x2 weeks  CT HEAD WITHOUT CONTRAST  Technique:  Contiguous axial images were obtained from the base of the skull through the vertex without contrast.  Comparison: MRI brain dated 06/22/2012  Findings: No evidence of parenchymal hemorrhage or extra-axial fluid collection.  Vasogenic edema in the subcortical left frontal lobe (series 2/image 21).  An underlying mass (possibly metastasis) is suspected.  Cerebral volume is age appropriate.  No ventriculomegaly.  The visualized paranasal sinuses are essentially clear. The mastoid air cells are unopacified.  No evidence of calvarial fracture.  No CT evidence of acute infarction.  IMPRESSION: Vasogenic edema in the subcortical left frontal lobe.  An underlying mass (possibly metastasis) is suspected.  Consider MRI brain without / with contrast for further evaluation.   Original Report Authenticated By: Charline Bills, M.D.    Mr Laqueta Jean Wo Contrast  01/22/2013  *RADIOLOGY REPORT*  Clinical Data: 2-week history of right upper extremity weakness. Also right lower extremity weakness with gait disturbance.  History of metastatic non-small cell lung cancer diagnosed August 2013.  MRI HEAD  WITHOUT AND WITH CONTRAST  Technique:  Multiplanar, multiecho pulse sequences of the brain and surrounding structures were obtained according to standard protocol without and with intravenous contrast  Contrast: 16mL MULTIHANCE GADOBENATE DIMEGLUMINE 529 MG/ML IV SOLN  Comparison: CT scan earlier today.  Prior MRI brain 06/22/2012.   Findings:  There is a 28 x 31 x 30 mm apparent solitary intra-axial lesion to the left posterior frontal cortex and subcortical white matter.  Moderate surrounding vasogenic edema noted.  The lesion itself shows moderate restricted diffusion.  No significant hemorrhage.  No midline shift.  No other visible intracranial lesions. In the setting of a known lung cancer with known metastatic disease, this lesion likely represents a metastasis. Primary brain tumor or abscess felt unlikely.  Slight age related atrophy.  Minimal periventricular T2 signal prolongation, likely incidental chronic microvascular ischemic change.  No hydrocephalus.  No osseous lesions.  Mild pannus. Normal pituitary and cerebellar tonsils.  Major intracranial vascular structures patent.  Mild chronic sinus disease.  Negative orbits and mastoids.  IMPRESSION: Findings consistent with a 28 x 31 x 30 mm apparent solitary intra- axial metastasis to the left posterior frontal cortex and subcortical white matter.  Moderate surrounding vasogenic edema without midline shift.   Original Report Authenticated By: Davonna Belling, M.D.     Medications: I have reviewed the patient's current medications.   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  I have a lengthy discussion with the patient and his wife today about his condition. I agree with keeping the patient on Decadron as ordered by Dr. Darnelle Catalan. We'll consult radiation oncology, Dr. Mitzi Hansen to evaluate the patient and consideration of palliative radiation to the brain likely in the form of stereotactic radiotherapy. The patient has a followup appointment with me in one month with repeat CT scan of the chest, abdomen and pelvis for restaging of his disease. Thank you  so much for taken good care of Mr. Aeschliman. I will continue to follow the patient with you and assist in his management.   LOS: 1 day    Shauniece Kwan K. 01/23/2013

## 2013-01-23 NOTE — Progress Notes (Signed)
Rehab Admissions Coordinator Note:  Patient was screened by Derrick Crosby for appropriateness for an Inpatient Acute Rehab Consult.  Noted PT and OT recommend IP rehab admission. At this time, we are recommending await medical workup and medical plan of care for this brain metastasis to determine if IP rehab consult most appropriate. I will follow. Noted seizure today.   Derrick Crosby 01/23/2013, 12:17 PM  I can be reached at [phone].

## 2013-01-23 NOTE — Progress Notes (Deleted)
Repaged Gwinda Passe NP about pt's pain control at 1643. Eugene Garnet RN

## 2013-01-23 NOTE — Evaluation (Addendum)
Occupational Therapy Evaluation Patient Details Name: Derrick Crosby MRN: [id number] DOB: 05-Apr-1951 Today's Date: 01/23/2013 Time: 4098-1191 OT Time Calculation (min): 28 min  OT Assessment / Plan / Recommendation Clinical Impression  this 62 year old man with NSCLC was found to have a solitary met to brain and presents with RUE weakness.  He had a 2 week h/o weakness prior to hospitalization.  He will benefit from skilled OT to increase independence with adls, safety and facilitate use of RUE.      OT Assessment  Patient needs continued OT Services    Follow Up Recommendations  CIR    Barriers to Discharge      Equipment Recommendations  3 in 1 bedside comode    Recommendations for Other Services    Frequency  Min 2X/week    Precautions / Restrictions Precautions Precautions: Fall Precaution Comments: impulsive Restrictions Weight Bearing Restrictions: No   Pertinent Vitals/Pain Pt c/o headache.  Not rated.  RN alerted    ADL  Eating/Feeding: Set up Where Assessed - Eating/Feeding: Chair Grooming: Supervision/safety Where Assessed - Grooming: Supported sitting Upper Body Bathing: Minimal assistance Where Assessed - Upper Body Bathing: Supported sitting Lower Body Bathing: Moderate assistance Where Assessed - Lower Body Bathing: Supported sit to stand Upper Body Dressing: Moderate assistance Where Assessed - Upper Body Dressing: Unsupported sitting Lower Body Dressing: Maximal assistance Where Assessed - Lower Body Dressing: Supported sit to stand Toilet Transfer: +2 Total assistance Toilet Transfer: Patient Percentage: 60% Toilet Transfer Method: Sit to stand Toileting - Clothing Manipulation and Hygiene: Maximal assistance Where Assessed - Engineer, mining and Hygiene: Sit to stand from 3-in-1 or toilet Equipment Used: Gait belt;Rolling walker Transfers/Ambulation Related to ADLs: pt able to grasp walker with RUE but could not push evenly/control  due to RUE weakness.  Impulsive ADL Comments: Limited ADLs due to RUE weakness.  Pt very motivated.  Wife asking about RUE function and brain.  Will provide written HEP to pt/wife later focusing on RUE strengthening.      OT Diagnosis: Generalized weakness;Cognitive deficits  OT Problem List: Decreased strength;Decreased activity tolerance;Impaired balance (sitting and/or standing);Decreased coordination;Decreased cognition;Decreased safety awareness;Impaired tone;Impaired UE functional use;Pain OT Treatment Interventions: Self-care/ADL training;Therapeutic exercise;Neuromuscular education;Therapeutic activities;Cognitive remediation/compensation;Patient/family education;Balance training   OT Goals Acute Rehab OT Goals OT Goal Formulation: With patient/family Time For Goal Achievement: 02/06/13 Potential to Achieve Goals: Good ADL Goals Pt Will Perform Lower Body Bathing: with min assist;Sit to stand from chair ADL Goal: Lower Body Bathing - Progress: Goal set today Pt Will Perform Lower Body Dressing: with mod assist;Sit to stand from chair ADL Goal: Lower Body Dressing - Progress: Goal set today Pt Will Transfer to Toilet: with min assist;Stand pivot transfer;3-in-1 ADL Goal: Toilet Transfer - Progress: Goal set today Pt Will Perform Toileting - Hygiene: with min assist;Sit to stand from 3-in-1/toilet ADL Goal: Toileting - Hygiene - Progress: Goal set today Arm Goals Additional Arm Goal #1: pt will be supervision with HEP for RUE strengthening and coordination as well as positioning RUE in bed/chair (fingers extended) Arm Goal: Additional Goal #1 - Progress: Goal set today Miscellaneous OT Goals Miscellaneous OT Goal #1: Pt will pt will maintain min dynamic balance during LB adls with min A x 2 minutes OT Goal: Miscellaneous Goal #1 - Progress: Goal set today Miscellaneous OT Goal #2: Pt will attend to RUE positioning on/off surface and walker with 1 vc OT Goal: Miscellaneous Goal #2 -  Progress: Goal set today  Visit Information  Last OT Received On: 01/23/13 Assistance Needed: +2 PT/OT Co-Evaluation/Treatment: Yes    Subjective Data  Subjective: That'll be good (to get up) Patient Stated Goal: wife's:  get R arm better   Prior Functioning     Home Living Lives With: Spouse Type of Home: Mobile home Home Access: Stairs to enter Entrance Stairs-Number of Steps: 4 Entrance Stairs-Rails: None Home Layout: One level Bathroom Shower/Tub: Engineer, manufacturing systems: Standard Home Adaptive Equipment: Environmental consultant - rolling;Walker - four wheeled;Bedside commode/3-in-1 Additional Comments: per wife , pt. takes a tub bath but last time had very much difficulty getting out. Prior Function Level of Independence: Needs assistance Needs Assistance: Bathing;Feeding;Grooming;Gait;Transfers Gait Assistance: recently has held onto furniture, has had falls. Driving: No Communication Communication: No difficulties Dominant Hand: Left         Vision/Perception     Cognition  Cognition Overall Cognitive Status: Impaired Area of Impairment: Memory;Following commands;Safety/judgement;Awareness of errors;Awareness of deficits;Problem solving Arousal/Alertness: Awake/alert Orientation Level: Disoriented to;Time;Situation (stated hospital when prompted.) Behavior During Session: Restless Following Commands: Follows one step commands inconsistently Safety/Judgement: Decreased awareness of safety precautions;Decreased safety judgement for tasks assessed;Decreased awareness of need for assistance;Impulsive Problem Solving: requires repeated instructions  for activity, reminders to slow down. Cognition - Other Comments: wife states pt doesn't read/write.  Able to read 18 on calendar:  attempted to state date    Extremity/Trunk Assessment Right Upper Extremity Assessment RUE ROM/Strength/Tone: Deficits RUE ROM/Strength/Tone Deficits: palpated anterior delts in gravity  eliminated, pects, biceps, wrist flexors.  Has finger flexion and extension.  Educated to hold by wrist when performing PROM.   RUE Sensation:  (pt reports decreased but feels touch) RUE Coordination: Deficits RUE Coordination Deficits: weakness throughout arm.  Can hold containers to open; opposed to 2nd/3rd digits Left Upper Extremity Assessment LUE ROM/Strength/Tone: Within functional levels Right Lower Extremity Assessment RLE ROM/Strength/Tone: Deficits RLE ROM/Strength/Tone Deficits: avtive movement throughout against gravity: knee extension 3, hip flexion 2+, abd 3. RLE Sensation: WFL - Light Touch RLE Coordination: Deficits RLE Coordination Deficits: decreased speed of repetitive/reciprocal movements. Left Lower Extremity Assessment LLE ROM/Strength/Tone: WFL for tasks assessed LLE Sensation: WFL - Light Touch     Mobility Bed Mobility Bed Mobility: Rolling Left;Rolling Right;Right Sidelying to Sit;Sitting - Scoot to [place] of Bed Rolling Right: 4: Min guard Rolling Left: 5: Supervision Right Sidelying to Sit: 4: Min guard Sitting - Scoot to [place] of Bed: 4: Min guard Details for Bed Mobility Assistance: cues to hold RUE during roll to L, cues for safety. Pt sat up with a lurch  to get up right. scoots with LUE assisting. Transfers Sit to Stand: 3: Mod assist;From bed;With upper extremity assist;From elevated surface Stand to Sit: 4: Min assist;With armrests Details for Transfer Assistance: tactile cues to push from Bed with LUE and to hold RW with RUE> Mod. Assistance need to place R hand on RW. tactile cues to let R hand go of RW and guidance to reach R hand to recliner although pt was not attending to RUE at the time.     Exercise RUE flexion/extension of fingers, opposition to 2nd and 3rd digits.  Holding containers and actively releasing.  Educated to hold by wrist during PROM.  Also educated to keep R fingers extended at rest.   Balance Balance Balance Assessed:  Yes Static Sitting Balance Static Sitting - Balance Support: Left upper extremity supported Static Sitting - Level of Assistance: 5: Stand by assistance   End of Session OT - End of Session  Activity Tolerance: Patient tolerated treatment well Patient left: in chair;with call bell/phone within reach;with family/visitor present  GO     Sherman Donaldson 01/23/2013, 10:48 AM Marica Otter, OTR/L [phone] 01/23/2013

## 2013-01-23 NOTE — Progress Notes (Signed)
Support for spouse, Derrick Crosby. She is tearful. She told me this is the first time she has cried in a long time. She is afraid of losing [name], the patient. This is her second marriage and it has been a good one. 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 I listened as she processed her previous losses and her fears. Support; presence; comfort.

## 2013-01-23 NOTE — Progress Notes (Signed)
Radiation Oncology         [phone]) [phone] ________________________________  Initial outpatient Consultation - Date: 01/22/2013   Name: Derrick Crosby MRN: [id number]   DOB: Jan 02, 1951  REFERRING PHYSICIAN: C. Rama, MD  DIAGNOSIS: The encounter diagnosis was Weakness. due to brain metastases  HISTORY OF PRESENT ILLNESS::Derrick Crosby is a 62 y.o. male  completed radiation to his left chest in September of last year. He received adjuvant chemotherapy and had no evidence of disease on staging studies performed 11/27/2012. He presented to the hospital with a two-week history of right arm paralysis. He was admitted and an MRI of the brain performed yesterday showed a 2.8 x 3.1 x 3.0 cm lesion in the left frontal lobe. Vasogenic edema was noted. No midline shift was noted. No other intercranial lesions were noted. He had a witnessed seizure this morning and has been loaded with lorazepam and Breath. When I evaluated him he was actively trying to get out of bed. He was confused. Most of the history is taken from his wife. He states that he ha had a headache this morning but it is gone now. He denies any previous headaches or seizure activity. He and his wife denied any prior brain radiation. He states that his right leg is weak. He also feels unsteady on his feet. He reports no change in sensation in his face or leg. He reports his left arm and leg are normal..  PREVIOUS RADIATION THERAPY: Yes 37.5 gray to the left hilum completed September 2013  PAST MEDICAL HISTORY:  has a past medical history of Hypertension; Hyperglyceridemia, pure; COPD (chronic obstructive pulmonary disease); Hemoptysis; Anxiety; Depression; Hemoptysis; Seasonal allergies; History of radiation therapy (07/05/12-07/26/12); Cancer; Skin cancer; and Lung cancer (06/13/12).    PAST SURGICAL HISTORY: Past Surgical History  Procedure Laterality Date  . Tympanoplasty    . Video bronchoscopy  06/13/12    with endobronchial u/s/ with  biospy left hilar mass/Dr. Lonia Farber  . Tonsillectomy      child    FAMILY HISTORY: @[facility] @  SOCIAL HISTORY:  History  Substance Use Topics  . Smoking status: Former Smoker -- 0.50 packs/day for 50 years    Types: Cigarettes    Quit date: 06/14/2012  . Smokeless tobacco: Never Used     Comment: pt trying to quit  . Alcohol Use: No     Comment: occasionally beer     ALLERGIES: Review of patient's allergies indicates no known allergies.  MEDICATIONS:  Current Facility-Administered Medications  Medication Dose Route Frequency Provider Last Rate Last Dose  . 0.9 %  sodium chloride infusion  250 mL Intravenous PRN Maryruth Bun Rama, MD      . acetaminophen (TYLENOL) tablet 650 mg  650 mg Oral Q6H PRN Maryruth Bun Rama, MD   650 mg at 01/23/13 0948  . albuterol (PROVENTIL HFA;VENTOLIN HFA) 108 (90 BASE) MCG/ACT inhaler 2 puff  2 puff Inhalation Q6H PRN Christina P Rama, MD      . alum & mag hydroxide-simeth (MAALOX/MYLANTA) 200-200-20 MG/5ML suspension 30 mL  30 mL Oral Q6H PRN Christina P Rama, MD      . amitriptyline (ELAVIL) tablet 25 mg  25 mg Oral QHS Maryruth Bun Rama, MD   25 mg at 01/22/13 2139  . aspirin EC tablet 81 mg  81 mg Oral Daily Maryruth Bun Rama, MD   81 mg at 01/23/13 0948  . Chlorhexidine Gluconate Cloth 2 % PADS 6 each  6 each Topical Q0600 Christina  P Rama, MD   6 each at 01/23/13 [id number]  . chlorproMAZINE (THORAZINE) tablet 10 mg  10 mg Oral QID PRN Maryruth Bun Rama, MD   10 mg at 01/23/13 1610  . citalopram (CELEXA) tablet 20 mg  20 mg Oral QPM Maryruth Bun Rama, MD   20 mg at 01/23/13 1715  . clonazePAM (KLONOPIN) tablet 0.5 mg  0.5 mg Oral BID PRN Maryruth Bun Rama, MD   0.5 mg at 01/23/13 1714  . dexamethasone (DECADRON) injection 6 mg  6 mg Intravenous Q6H Maryruth Bun Rama, MD   6 mg at 01/23/13 1208  . enoxaparin (LOVENOX) injection 40 mg  40 mg Subcutaneous Q24H Maryruth Bun Rama, MD   40 mg at 01/22/13 1840  . fish oil-omega-3 fatty acids capsule 1 g   1 g Oral Daily Maryruth Bun Rama, MD   1 g at 01/23/13 1000  . gabapentin (NEURONTIN) capsule 600 mg  600 mg Oral TID Maryruth Bun Rama, MD   600 mg at 01/23/13 1610  . guaiFENesin (MUCINEX) 12 hr tablet 1,200 mg  1,200 mg Oral BID Maryruth Bun Rama, MD   1,200 mg at 01/23/13 0948  . HYDROcodone-homatropine (HYCODAN) 5-1.5 MG/5ML syrup 5 mL  5 mL Oral Q6H PRN Christina P Rama, MD      . levETIRAcetam (KEPPRA) 500 mg in sodium chloride 0.9 % 100 mL IVPB  500 mg Intravenous Q12H Maryruth Bun Rama, MD   500 mg at 01/23/13 1208  . lidocaine-prilocaine (EMLA) cream 1 application  1 application Topical PRN Maryruth Bun Rama, MD      . loratadine (CLARITIN) tablet 10 mg  10 mg Oral Daily Maryruth Bun Rama, MD   10 mg at 01/23/13 0948  . LORazepam (ATIVAN) injection 1 mg  1 mg Intravenous Q30 min PRN Christina P Rama, MD      . mupirocin ointment (BACTROBAN) 2 % 1 application  1 application Nasal BID Maryruth Bun Rama, MD   1 application at 01/23/13 1030  . polyethylene glycol (MIRALAX / GLYCOLAX) packet 17 g  17 g Oral Daily Maryruth Bun Rama, MD   17 g at 01/23/13 0948  . promethazine (PHENERGAN) injection 12.5 mg  12.5 mg Intravenous Q4H PRN Maryruth Bun Rama, MD   12.5 mg at 01/23/13 1416  . sodium chloride 0.9 % injection 3 mL  3 mL Intravenous Q12H Christina P Rama, MD      . sodium chloride 0.9 % injection 3 mL  3 mL Intravenous PRN Christina P Rama, MD      . sucralfate (CARAFATE) tablet 1 g  1 g Oral QID Maryruth Bun Rama, MD   1 g at 01/23/13 1416  . triamcinolone cream (KENALOG) 0.1 % 1 application  1 application Topical BID Maryruth Bun Rama, MD   1 application at 01/23/13 9604    REVIEW OF SYSTEMS:  A 15 point review of systems is documented in the electronic medical record. This was obtained by the nursing staff. However, I reviewed this with the patient to discuss relevant findings and make appropriate changes.  Pertinent items are noted in HPI.   PHYSICAL EXAM:  Filed Vitals:   01/23/13 1326  BP:  128/79  Pulse: 114  Temp: 99.7 F (37.6 C)  Resp: 18  . He is oriented only to place. He has 3/5 strength in arm flexion and extension on the right. He has one out of 5 grip on the right. He has 5 out of 5 strength in  his left upper extremity. He has 5 out of 5 dorsi and plantar flexion of the feet on the right side. He has no change to light touch on the right extremity.  LABORATORY DATA:  Lab Results  Component Value Date   WBC 8.5 01/22/2013   HGB 13.8 01/22/2013   HCT 39.6 01/22/2013   MCV 93.6 01/22/2013   PLT 179 01/22/2013   Lab Results  Component Value Date   NA 137 01/22/2013   K 4.1 01/22/2013   CL 101 01/22/2013   CO2 25 01/22/2013   Lab Results  Component Value Date   ALT 40 01/22/2013   AST 27 01/22/2013   ALKPHOS 116 01/22/2013   BILITOT 0.4 01/22/2013     RADIOGRAPHY: Ct Head Wo Contrast  01/22/2013  *RADIOLOGY REPORT*  Clinical Data: Fall, right frontal abrasion, right arm/leg weakness x2 weeks  CT HEAD WITHOUT CONTRAST  Technique:  Contiguous axial images were obtained from the base of the skull through the vertex without contrast.  Comparison: MRI brain dated 06/22/2012  Findings: No evidence of parenchymal hemorrhage or extra-axial fluid collection.  Vasogenic edema in the subcortical left frontal lobe (series 2/image 21).  An underlying mass (possibly metastasis) is suspected.  Cerebral volume is age appropriate.  No ventriculomegaly.  The visualized paranasal sinuses are essentially clear. The mastoid air cells are unopacified.  No evidence of calvarial fracture.  No CT evidence of acute infarction.  IMPRESSION: Vasogenic edema in the subcortical left frontal lobe.  An underlying mass (possibly metastasis) is suspected.  Consider MRI brain without / with contrast for further evaluation.   Original Report Authenticated By: Charline Bills, M.D.    Mr Laqueta Jean Wo Contrast  01/22/2013  *RADIOLOGY REPORT*  Clinical Data: 2-week history of right upper extremity weakness. Also  right lower extremity weakness with gait disturbance.  History of metastatic non-small cell lung cancer diagnosed August 2013.  MRI HEAD WITHOUT AND WITH CONTRAST  Technique:  Multiplanar, multiecho pulse sequences of the brain and surrounding structures were obtained according to standard protocol without and with intravenous contrast  Contrast: 16mL MULTIHANCE GADOBENATE DIMEGLUMINE 529 MG/ML IV SOLN  Comparison: CT scan earlier today.  Prior MRI brain 06/22/2012.  Findings:  There is a 28 x 31 x 30 mm apparent solitary intra-axial lesion to the left posterior frontal cortex and subcortical white matter.  Moderate surrounding vasogenic edema noted.  The lesion itself shows moderate restricted diffusion.  No significant hemorrhage.  No midline shift.  No other visible intracranial lesions. In the setting of a known lung cancer with known metastatic disease, this lesion likely represents a metastasis. Primary brain tumor or abscess felt unlikely.  Slight age related atrophy.  Minimal periventricular T2 signal prolongation, likely incidental chronic microvascular ischemic change.  No hydrocephalus.  No osseous lesions.  Mild pannus. Normal pituitary and cerebellar tonsils.  Major intracranial vascular structures patent.  Mild chronic sinus disease.  Negative orbits and mastoids.  IMPRESSION: Findings consistent with a 28 x 31 x 30 mm apparent solitary intra- axial metastasis to the left posterior frontal cortex and subcortical white matter.  Moderate surrounding vasogenic edema without midline shift.   Original Report Authenticated By: Davonna Belling, M.D.       IMPRESSION: 62 year old with metastatic non-small cell lung cancer presenting with large brain metastases and a poor performance status including paralysis and seizures  PLAN: I spoke with the patient and his wife. He did not seem to follow much for conversation. His wife understood  that weakness was being caused by tumor in his brain. We discussed  radiation therapy to hopefully shrink that tumor and relieve some of the intracranial pressure and vasogenic edema. We discussed continuing his treatment while he was an inpatient and starting it tomorrow. I am worried given the size of his lesion that he would be at risk for continuing seizures do to worsening edema as the tumor responds radiation treatment. I discussed with his wife watching out for signs such as increasing headaches and increasing weakness. I think it's probably best if we can keep him in the hospital for the next few days to just see how he is going to react. I did discuss the case with neurosurgery who felt that upfront resection would render him permanently paralyzed on the right side secondary to its location in the motor strip. Given this devastating and permanent change we felt like it was reasonable to begin with radiation. In addition he has a poor performance status and a tumor resection at this time may produce a great risk to his health. I discussed with his wife we would start radiation treatments in an any sign of worsening clinically we could proceed on with surgery at that time. We discussed the possible side effects of treatment including but not limited to hair loss and fatigue. His wife signed informed consent for him as he was not oriented. I've scheduled him for simulation tomorrow morning and will begin his treatments tomorrow afternoon. I will plan on 12 treatments with slightly lower dose per fraction 2 decrease the risk of further edema. He will continue on Keppra and Decadron. I spent 30 minutes  face to face with the patient and more than 50% of that time was spent in counseling and/or coordination of care.   ------------------------------------------------  Lurline Hare, MD

## 2013-01-23 NOTE — Progress Notes (Addendum)
This RN called to pt bedside, informed by Neysa Bonito RN and Noreene Larsson RN that pt had a seizure. Pt had received 2mg  ativan and no signs of seizure activity was observed by this RN.   This RN received order to give Keppra IV, see MAR. Informed pt's wife of it's use for seizure prevention in the future and continued use of IV decadron to decrease swelling. Pt's wife had questions about the seizure and it's cause. This RN answered wife's questions and provided emotional support. This RN to continue to monitor pt's condition and provide emotional support to pt and family. Eugene Garnet RN

## 2013-01-23 NOTE — Progress Notes (Signed)
CRITICAL VALUE ALERT  Critical value received:  MRSA PCR +  Date of notification:  01/23/2013  Time of notification:  0220  Critical value read back: yes  Nurse who received alert:  Cynda Familia, RN  MD notified (1st page):  Merdis Delay, NP  Time of first page:  0224  MD notified (2nd page):  Time of second page:  Responding MD:  Merdis Delay, NP  Time MD responded:  0227  Standing orders have been initiated for positive MRSA PCR. Pt already on contact (orange) isolation. Midlevel provider also informed that pt had been on Doxycycline prior to admission for MRSA. Midlevel stated she would pass this on to dayshift to determine whether or not to resume Doxy.

## 2013-01-24 ENCOUNTER — Ambulatory Visit
Admit: 2013-01-24 | Discharge: 2013-01-24 | Disposition: A | Discharge status: Home / Self Care | Payer: BC Managed Care – PPO | Attending: Radiation Oncology | Admitting: Radiation Oncology

## 2013-01-24 DIAGNOSIS — L299 Pruritus, unspecified: Secondary | ICD-10-CM | POA: Insufficient documentation

## 2013-01-24 DIAGNOSIS — Z79899 Other long term (current) drug therapy: Secondary | ICD-10-CM | POA: Insufficient documentation

## 2013-01-24 DIAGNOSIS — C7931 Secondary malignant neoplasm of brain: Secondary | ICD-10-CM

## 2013-01-24 DIAGNOSIS — Z510 Encounter for antineoplastic radiation therapy: Secondary | ICD-10-CM | POA: Insufficient documentation

## 2013-01-24 DIAGNOSIS — G8320 Monoplegia of upper limb affecting unspecified side: Secondary | ICD-10-CM | POA: Insufficient documentation

## 2013-01-24 DIAGNOSIS — K219 Gastro-esophageal reflux disease without esophagitis: Secondary | ICD-10-CM | POA: Insufficient documentation

## 2013-01-24 NOTE — Progress Notes (Signed)
Physical Therapy Treatment Patient Details Name: Derrick Crosby MRN: [id number] DOB: 11-25-1950 Today's Date: 01/24/2013 Time: 4098-1191 PT Time Calculation (min): 27 min  PT Assessment / Plan / Recommendation Comments on Treatment Session  pt pleasant and motivated to participate with PT    Follow Up Recommendations  CIR;SNF     Does the patient have the potential to tolerate intense rehabilitation     Barriers to Discharge        Equipment Recommendations  None recommended by PT    Recommendations for Other Services    Frequency Min 3X/week   Plan Discharge plan remains appropriate;Frequency remains appropriate    Precautions / Restrictions Precautions Precautions: Fall Precaution Comments: impulsive   Pertinent Vitals/Pain Denies pain    Mobility  Bed Mobility Bed Mobility: Sit to Supine Sitting - Scoot to Edge of Bed: 4: Min guard Sit to Supine: 4: Min assist;HOB flat Details for Bed Mobility Assistance: cues for RUE position/awareness; cues to slow down for safety Transfers Transfers: Sit to Stand;Stand to Sit Sit to Stand: 3: Mod assist;From bed;From chair/3-in-1 Stand to Sit: 4: Min assist;3: Mod assist;To chair/3-in-1 (x2) Details for Transfer Assistance: multi-modal cues for hand placement, wt shift and safety ( hand over hand  for RUE) Ambulation/Gait Ambulation/Gait Assistance: 1: +2 Total assist Ambulation/Gait: Patient Percentage: 60% Ambulation Distance (Feet): 80 Feet (times 2 with rest between) Assistive device: Carley Hammed walker Ambulation/Gait Assistance Details: pt able to maintain R grip better  with EVA walker,  requires multi-modal cues for upward gaze, walker direction,  tactile pelvic input/stability and facitlitation of wt shift,   Gait Pattern: Step-through pattern;Decreased stance time - right;Decreased weight shift to right;Decreased dorsiflexion - right;Right circumduction;Right genu recurvatum;Decreased trunk rotation General Gait Details: +2  required for safety, balance, IV    Exercises General Exercises - Upper Extremity Shoulder Flexion: AAROM;PROM;5 reps;Right;Seated Elbow Flexion: AAROM;PROM;Right;10 reps;Seated Elbow Extension: PROM;Right;10 reps (encouraged pt/wife to be gentle with self range)   PT Diagnosis:    PT Problem List:   PT Treatment Interventions:     PT Goals Acute Rehab PT Goals Time For Goal Achievement: 02/06/13 Potential to Achieve Goals: Good Pt will go Supine/Side to Sit: Independently PT Goal: Supine/Side to Sit - Progress: Progressing toward goal Pt will go Sit to Stand: with supervision PT Goal: Sit to Stand - Progress: Progressing toward goal Pt will go Stand to Sit: with supervision PT Goal: Stand to Sit - Progress: Progressing toward goal Pt will Stand: with supervision;3 - 5 min;with no upper extremity support PT Goal: Stand - Progress: Progressing toward goal Pt will Ambulate: >150 feet;with supervision;with least restrictive assistive device PT Goal: Ambulate - Progress: Progressing toward goal Pt will Perform Home Exercise Program: with supervision, verbal cues required/provided PT Goal: Perform Home Exercise Program - Progress: Progressing toward goal  Visit Information  Last PT Received On: 01/24/13 Assistance Needed: +2    Subjective Data  Subjective: I am ready to walk.`   Cognition  Cognition Overall Cognitive Status: Impaired Area of Impairment: Memory;Following commands;Safety/judgement;Awareness of errors;Awareness of deficits;Problem solving Arousal/Alertness: Awake/alert Behavior During Session: Restless Following Commands: Follows one step commands inconsistently Safety/Judgement: Impulsive;Decreased awareness of need for assistance;Decreased safety judgement for tasks assessed Safety/Judgement - Other Comments: pt sits up with both side rails up on R side as well as Sz padding Awareness of Errors: Assistance required to identify errors made Problem Solving:  requires repeated instructions  for activity, reminders to slow down. 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 Sitting Balance Static Sitting - Balance Support: Left upper extremity supported Static Sitting - Level of Assistance: 5: Stand by assistance Static Sitting - Comment/# of Minutes: cues for safety, pt impulsive, attempts to stand without assist Dynamic Standing Balance Dynamic Standing - Balance Support: Bilateral upper extremity supported Dynamic Standing - Level of Assistance: 4: Min assist;3: Mod assist  End of Session PT - End of Session Equipment Utilized During Treatment: Gait belt Activity Tolerance: Patient tolerated treatment well Patient left: in chair;with call bell/phone within reach;with family/visitor present   GP     [facility] 01/24/2013, 12:02 PM

## 2013-01-24 NOTE — Progress Notes (Signed)
Clinical Social Work Department BRIEF PSYCHOSOCIAL ASSESSMENT 01/24/2013  Patient:  Derrick Crosby, Derrick Crosby     Account Number:  [id number]     Admit date:  01/22/2013  Clinical Social Worker:  Jacelyn Grip  Date/Time:  01/24/2013 11:00 AM  Referred by:  Physician  Date Referred:  01/24/2013 Referred for  SNF Placement   Other Referral:   Interview type:  Patient Other interview type:   patient spouse and patient parents at bedside    PSYCHOSOCIAL DATA Living Status:  WIFE Admitted from facility:   Level of care:   Primary support name:  Derrick Crosby/spouse Primary support relationship to patient:  SPOUSE Degree of support available:   strong    CURRENT CONCERNS Current Concerns  Post-Acute Placement   Other Concerns:    SOCIAL WORK ASSESSMENT / PLAN CSW received notification from MD that recommendation for pt is for SNF at d/c and pt and pt family interested in speaking with CSW.    CSW met with pt, pt spouse, and pt parents at bedside. CSW discussed recommendation for SNF placement and clarified pt and pt family questions. CSW provided emotional support to pt and pt family as pt has metastasis to brain with right arm paralysis and is having a more difficult time with self care. Pt and pt spouse are agreeable to SNF search for short term rehab in [facility]. CSW discussed potential issues surrounding transportation from SNF for radiation treatment and depending on if the family can provide transportation and if not and facility in [place] unable to provide transportation then may have to expand search to [facility]. Pt spouse expressed understanding, but very hopeful to have pt close to family in [place].    CSW completed FL2 and initiated SNF search to [facility] excluding [facility] at pt family request. CSW to follow up with pt re: bed offers and continue to provide support.    CSW to facilitate pt discharge needs when pt medically stable  for discharge.   Assessment/plan status:  Psychosocial Support/Ongoing Assessment of Needs Other assessment/ plan:   discharge planning   Information/referral to community resources:   [facility] and [facility] list    PATIENT'S/FAMILY'S RESPONSE TO PLAN OF CARE: Pt alert and oriented x 3. Pt emotional during assessment as pt has recently learned about metastasis to brain and will be beginning radiation today. Pt and pt spouse recognize need for short term rehab in order for pt to get stronger and be able to manage care at home.      Jacklynn Lewis, MSW, LCSWA  Clinical Social Work [phone]

## 2013-01-24 NOTE — Progress Notes (Signed)
[facility] PROGRESS NOTE  Derrick GHEEN YNW:[id number] DOB: [birth year] DOA: 01/22/2013 PCP: Arlyss Queen  Brief narrative: 63 y.o. left handed male with a PMH of metastatic non-small cell lung cancer diagnosed 06/2012, under the care of Dr. Arbutus Ped, status post systemic chemotherapy and XRT who was admitted 01/22/2013 with a two-week history of progressive weakness progressing to paralysis of his right upper arm and hand. 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 Radiation oncology has been consulted and the patient has been placed on Decadron. He also developed the acute onset of a tonic clonic seizure on 01/23/2013. Now on Keppra in addition to Decadron.  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 Based on CT head and MRI brain there is a vasogenic edema in the subcortical left frontal lobe.  Continue decadron 6mg  IV Q 6 hours and Keppra 500 mg Q 12 hours IV.  Appreciated radiation oncology following.  Likely placement to SNF Active Problems:  Tonic clonic seizure   Patient developed an acute tonic-clonic seizure on 01/23/2013. Given 2 mg of Ativan. No further seizure activity reported.  Keppra 500 mg IV every 12 hours started for seizure prophylaxis.  Depression with anxiety   Continue Celexa, Elavil, and Klonopin.  Squamous cell lung cancer   Per oncology COPD (chronic obstructive pulmonary disease)   Continue albuterol 2 puffs every 6 hours as needed for wheezing. Continue Mucinex and Hycodan for cough.   Code Status: Full.  Family Communication: [name], wife, [phone] updated at bedside.  Disposition Plan: SNF when accepting facility found.   Medical Consultants:  Radiation oncology Other Consultants:  Physical therapy  Occupational therapy Anti-infectives:  None.     [name], MD  [facility] Pager  [phone]  If 7PM-7AM, please contact night-coverage [email or web address] Password [facility] 01/24/2013, 5:13 PM   LOS: 2 days    HPI/Subjective: No acute overnight events.  Objective: Filed Vitals:   01/23/13 1326 01/23/13 2100 01/24/13 0548 01/24/13 1430  BP: 128/79 121/71 136/92 133/84  Pulse: 114 102 93 91  Temp: 99.7 F (37.6 C) 99 F (37.2 C) 97.1 F (36.2 C) 98.2 F (36.8 C)  TempSrc: Axillary Oral Oral Oral  Resp: 18 20 20 20   Height:      Weight:      SpO2: 92% 93% 98% 94%    Intake/Output Summary (Last 24 hours) at 01/24/13 1713 Last data filed at 01/24/13 1535  Gross per 24 hour  Intake    670 ml  Output   1300 ml  Net   -630 ml    Exam:   General:  Pt is alert, follows commands appropriately, not in acute distress  Cardiovascular: Regular rate and rhythm, S1/S2, no murmurs, no rubs, no gallops  Respiratory: Clear to auscultation bilaterally, no wheezing, no crackles, no rhonchi  Abdomen: Soft, non tender, non distended, bowel sounds present, no guarding  Extremities: No edema, pulses DP and PT palpable bilaterally  Neuro: right upper extremity flaccid paralysis.  Data Reviewed: Basic Metabolic Panel:  Recent Labs Lab 01/22/13 1240  NA 137  K 4.1  CL 101  CO2 25  GLUCOSE 98  BUN 17  CREATININE 1.04  CALCIUM 9.9   Liver Function Tests:  Recent Labs Lab 01/22/13 1240  AST 27  ALT 40  ALKPHOS 116  BILITOT 0.4  PROT 7.2  ALBUMIN 3.7   No results found for this  basename: LIPASE, AMYLASE,  in the last 168 hours No results found for this basename: AMMONIA,  in the last 168 hours CBC:  Recent Labs Lab 01/22/13 1240  WBC 8.5  NEUTROABS 6.0  HGB 13.8  HCT 39.6  MCV 93.6  PLT 179   Cardiac Enzymes: No results found for this basename: CKTOTAL, CKMB, CKMBINDEX, TROPONINI,  in the last 168 hours BNP: No components found with this basename: POCBNP,  CBG: No results found for this basename: GLUCAP,  in the last 168 hours  Recent  Results (from the past 240 hour(s))  MRSA PCR SCREENING     Status: Abnormal   Collection Time    01/22/13  9:43 PM      Result Value Range Status   MRSA by PCR POSITIVE (*) NEGATIVE Final     Studies: No results found.  Scheduled Meds: . amitriptyline  25 mg Oral QHS  . aspirin EC  81 mg Oral Daily  . citalopram  20 mg Oral QPM  . dexamethasone  6 mg Intravenous Q6H  . enoxaparin (LOVENOX)   40 mg Subcutaneous Q24H  . fish oil-omega-3 fatty   1 g Oral Daily  . gabapentin  600 mg Oral TID  . guaiFENesin  1,200 mg Oral BID  . levETIRAcetam  500 mg Intravenous Q12H  . loratadine  10 mg Oral Daily  . polyethylene glycol  17 g Oral Daily  . sucralfate  1 g Oral QID  . triamcinolone cream  1 application Topical BID

## 2013-01-24 NOTE — Progress Notes (Addendum)
Clinical Social Work Department CLINICAL SOCIAL WORK PLACEMENT NOTE 01/24/2013  Patient:  Derrick Crosby, Derrick Crosby  Account Number:  [id number] Admit date:  01/22/2013  Clinical Social Worker:  Jacelyn Grip  Date/time:  01/24/2013 12:00 N  Clinical Social Work is seeking post-discharge placement for this patient at the following level of care:   SKILLED NURSING   (*CSW will update this form in Epic as items are completed)   01/24/2013  Patient/family provided with [facility] Department of Clinical Social Work's list of facilities offering this level of care within the geographic area requested by the patient (or if unable, by the patient's family).  01/24/2013  Patient/family informed of their freedom to choose among providers that offer the needed level of care, that participate in Medicare, Medicaid or managed care program needed by the patient, have an available bed and are willing to accept the patient.  01/24/2013  Patient/family informed of [facility]' ownership interest in [facility], as well as of the fact that they are under no obligation to receive care at this facility.  PASARR submitted to EDS on  PASARR number received from EDS on   FL2 transmitted to all facilities in geographic area requested by pt/family on  01/24/2013 FL2 transmitted to all facilities within larger geographic area on   Patient informed that his/her managed care company has contracts with or will negotiate with  certain facilities, including the following:     Patient/family informed of bed offers received:  01/25/2013 Patient chooses bed at [facility] Physician recommends and patient chooses bed at    Patient to be transferred to  on  [facility] on 01/29/2013 Patient to be transferred to facility by pt family via private vehicle  The following physician request were entered in Epic:   Additional Comments:  Jacklynn Lewis, MSW, LCSWA  Clinical Social  Work [phone]

## 2013-01-24 NOTE — Progress Notes (Signed)
Noted plans for radiation and plans to monitor for signs of clinically worsening and poor performance status. Do not recommend inpt rehab consult/admission at this time. Recommend SNF or Home Health and 24/7 care of family at this time. [phone]

## 2013-01-24 NOTE — Progress Notes (Signed)
  Radiation Oncology         [phone] ________________________________  Name: Derrick Crosby MRN: [id number]  Date: 01/24/2013  DOB: 18-Nov-1950  Simulation Verification Note  Status: outpatient  NARRATIVE: The patient was brought to the treatment unit and placed in the planned treatment position. The clinical setup was verified. Then port films were obtained and uploaded to the radiation oncology medical record software.  The treatment beams were carefully compared against the planned radiation fields. The position location and shape of the radiation fields was reviewed. The targeted volume of tissue appears appropriately covered by the radiation beams. Organs at risk appear to be excluded as planned.  Based on my personal review, I approved the simulation verification. The patient's treatment will proceed as planned.  ------------------------------------------------  Lurline Hare, MD

## 2013-01-24 NOTE — Progress Notes (Signed)
[facility]  Radiation Oncology   Simulation and Treatment Planning Note   Name: Derrick Crosby    MRN: [id number]  Date: 01/24/2013    DOB: 03-30-1951  Status:  DIAGNOSIS: Brain Metastases  CONSENT VERIFIED:yes   SET UP: Patient is setup supine   IMMOBILIZATION: Following immobilization is used:Aquaplast Mask   NARRATIVE:The patient was brought to the CT Simulation planning suite. Identity was confirmed. All relevant records and images related to the planned course of therapy were reviewed. Then, the patient was positioned in a stable reproducible clinical set-up for radiation therapy. CT images were obtained. Marks were placed on the mask. The CT images were loaded into the planning software where the target (brain) and avoidance structures (globes) were contoured. The radiation prescription was entered and confirmed.   TREATMENT PLANNING NOTE:  Treatment planning then occurred.  I have requested : MLC's, isodose plan, basic dose calculation  A total of 3 complex devices were constructed today including 2 mlcs which I personally evaluated and approved as well as his mask.

## 2013-01-25 ENCOUNTER — Ambulatory Visit
Admit: 2013-01-25 | Discharge: 2013-01-25 | Disposition: A | Discharge status: Home / Self Care | Payer: BC Managed Care – PPO | Attending: Radiation Oncology | Admitting: Radiation Oncology

## 2013-01-25 MED ORDER — OMEGA-3-ACID ETHYL ESTERS 1 G PO CAPS
1.0000 g | ORAL_CAPSULE | Freq: Two times a day (BID) | ORAL | Status: DC
  Filled 2013-01-25 (×2): qty 1

## 2013-01-25 MED ORDER — OMEGA-3-ACID ETHYL ESTERS 1 G PO CAPS
1.0000 g | ORAL_CAPSULE | Freq: Every day | ORAL | Status: DC
  Administered 2013-01-25 – 2013-01-29 (×4): 1 g via ORAL
  Filled 2013-01-25 (×5): qty 1

## 2013-01-25 NOTE — Progress Notes (Signed)
Pt called for assistance to go to the bathroom, and within 1-1.5 min. of the patient calling, myself and NT arrived to the room to find the pt in a sitting position on the floor. Asked pt what happened, and he said "I didn't want to wet the bed." Pt informed this RN that he scooted himself to the floor. No new bruising or injuries were visible. Asked pt if he hit anything on the way to the floor, which he denied. Wife was at bedside at this time. Wife said she didn't hear him fall. Midlevel provider on call was notified also. No orders were given for full evaluation post-fall.

## 2013-01-25 NOTE — Progress Notes (Signed)
[facility] PROGRESS NOTE  Derrick Crosby XBJ:[id number] DOB: 1951/04/01 DOA: 01/22/2013 PCP: Arlyss Queen  Brief narrative: 62 y.o. left handed male with a PMH of metastatic non-small cell lung cancer diagnosed 06/2012, under the care of Dr. Arbutus Ped, status post systemic chemotherapy and XRT who was admitted 01/22/2013 with a two-week history of progressive weakness progressing to paralysis of his right upper arm and hand. 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 Radiation oncology has been consulted and the patient has been placed on Decadron. He also developed the acute onset of a tonic clonic seizure on 01/23/2013. Now on Keppra in addition to Decadron.   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 Based on CT head and MRI brain there is a vasogenic edema in the subcortical left frontal lobe.  Continue decadron 6mg  IV Q 6 hours and Keppra 500 mg Q 12 hours IV.  Appreciated radiation oncology following.  SW assisting discharge plan to SNF Active Problems:  Tonic clonic seizure  Patient developed an acute tonic-clonic seizure on 01/23/2013. Given 2 mg of Ativan. No further seizure activity reported. Keppra 500 mg IV every 12 hours started for seizure prophylaxis.  Depression with anxiety  Continue Celexa, Elavil, and Klonopin.  Squamous cell lung cancer  Per oncology COPD (chronic obstructive pulmonary disease)  Continue albuterol 2 puffs every 6 hours as needed for wheezing. Continue Mucinex and Hycodan for cough.    Code Status: Full.  Family Communication: [name], wife, [phone] updated at bedside.  Disposition Plan: SNF when accepting facility found.   Medical Consultants:  Radiation oncology Other Consultants:  Physical therapy  Occupational therapy Anti-infectives:  None.   [name], MD  [facility]  Pager  [phone]      If 7PM-7AM, please contact night-coverage [email or web address] Password [email or web address] 01/25/2013, 5:03 PM   LOS: 3 days    HPI/Subjective: No acute overnight events.  Objective: Filed Vitals:   01/24/13 1430 01/24/13 2247 01/25/13 0610 01/25/13 1300  BP: 133/84 136/85 144/78 130/80  Pulse: 91 84 101 79  Temp: 98.2 F (36.8 C) 97.3 F (36.3 C) 97.1 F (36.2 C) 97.7 F (36.5 C)  TempSrc: Oral Oral Oral   Resp: 20 20 20 16   Height:      Weight:      SpO2: 94% 96% 96% 95%    Intake/Output Summary (Last 24 hours) at 01/25/13 1703 Last data filed at 01/25/13 1425  Gross per 24 hour  Intake   1260 ml  Output    400 ml  Net    860 ml    Exam:   General:  Pt is alert, follows commands appropriately, not in acute distress  Cardiovascular: Regular rate and rhythm, S1/S2, no murmurs, no rubs, no gallops  Respiratory: Clear to auscultation bilaterally, no wheezing, no crackles, no rhonchi  Abdomen: Soft, non tender, non distended, bowel sounds present, no guarding  Extremities: No edema, pulses DP and PT palpable bilaterally  Neuro: Grossly nonfocal  Data Reviewed: Basic Metabolic Panel:  Recent Labs Lab 01/22/13 1240  NA 137  K 4.1  CL 101  CO2 25  GLUCOSE 98  BUN 17  CREATININE 1.04  CALCIUM 9.9   Liver Function Tests:  Recent Labs Lab 01/22/13 1240  AST 27  ALT 40  ALKPHOS 116  BILITOT 0.4  PROT 7.2  ALBUMIN 3.7   No results found for this  basename: LIPASE, AMYLASE,  in the last 168 hours No results found for this basename: AMMONIA,  in the last 168 hours CBC:  Recent Labs Lab 01/22/13 1240  WBC 8.5  NEUTROABS 6.0  HGB 13.8  HCT 39.6  MCV 93.6  PLT 179   Cardiac Enzymes: No results found for this basename: CKTOTAL, CKMB, CKMBINDEX, TROPONINI,  in the last 168 hours BNP: No components found with this basename: POCBNP,  CBG: No results found for this basename: GLUCAP,  in the last 168 hours  Recent Results (from the past 240  hour(s))  MRSA PCR SCREENING     Status: Abnormal   Collection Time    01/22/13  9:43 PM      Result Value Range Status   MRSA by PCR POSITIVE (*) NEGATIVE Final   Comment:            The GeneXpert MRSA Assay (FDA     approved for NASAL specimens     only), is one component of a     comprehensive MRSA colonization     surveillance program. It is not     intended to diagnose MRSA     infection nor to guide or     monitor treatment for     MRSA infections.     RESULT CALLED TO, READ BACK BY AND VERIFIED WITH:     L SPENCER AT 0214 ON 03.18.2014 BY NBROOKS     Studies: No results found.  Scheduled Meds: . amitriptyline  25 mg Oral QHS  . aspirin EC  81 mg Oral Daily  . Chlorhexidine Gluconate Cloth  6 each Topical Q0600  . citalopram  20 mg Oral QPM  . dexamethasone  6 mg Intravenous Q6H  . enoxaparin (LOVENOX) injection  40 mg Subcutaneous Q24H  . gabapentin  600 mg Oral TID  . guaiFENesin  1,200 mg Oral BID  . levETIRAcetam  500 mg Intravenous Q12H  . loratadine  10 mg Oral Daily  . mupirocin ointment  1 application Nasal BID  . omega-3 acid ethyl esters  1 g Oral Daily  . polyethylene glycol  17 g Oral Daily  . sodium chloride  3 mL Intravenous Q12H  . sucralfate  1 g Oral QID  . triamcinolone cream  1 application Topical BID   Continuous Infusions:

## 2013-01-25 NOTE — Progress Notes (Signed)
Physical Therapy Treatment Patient Details Name: Derrick Crosby MRN: [id number] DOB: [birth year] Today's Date: 01/25/2013 Time: 1202-1232 PT Time Calculation (min): 30 min  PT Assessment / Plan / Recommendation Comments on Treatment Session  pt feeling more tired today, has had 2 treatments/XRT; continues to be very willing to participate with PT, although requiring increased time to complete activities today; will benefit from STSNF    Follow Up Recommendations  SNF     Does the patient have the potential to tolerate intense rehabilitation     Barriers to Discharge        Equipment Recommendations  None recommended by PT    Recommendations for Other Services    Frequency Min 3X/week   Plan Discharge plan remains appropriate;Frequency remains appropriate    Precautions / Restrictions Precautions Precautions: Fall Precaution Comments: impulsive   Pertinent Vitals/Pain Denies pain    Mobility  Bed Mobility Bed Mobility: Sit to Supine Rolling Right: 4: Min assist Details for Bed Mobility Assistance: cues for RUE position/awareness; cues to slow down for safety Transfers Transfers: Sit to Stand;Stand to Sit Sit to Stand: 3: Mod assist;From bed;From chair/3-in-1 Stand to Sit: 4: Min assist;3: Mod assist;With upper extremity assist;To chair/3-in-1 Details for Transfer Assistance: multi-modal cues for hand placement, wt shift and safety ( hand over hand  for RUE) Ambulation/Gait Ambulation/Gait Assistance: 1: +2 Total assist Ambulation/Gait: Patient Percentage: 60% Ambulation Distance (Feet): 80 Feet (times 2 ) Assistive device: Carley Hammed walker Ambulation/Gait Assistance Details: requires multi-modal cues for upward gaze, trunk extension, walker direction, tactile input for trunk and pelvic stability, facilitation of wt shift  Gait Pattern: Step-through pattern;Decreased stance time - right;Decreased weight shift to right;Decreased dorsiflexion - right;Right circumduction;Right  genu recurvatum;Decreased trunk rotation Gait velocity: decreased General Gait Details: +2 required for safety, balance, IV    Exercises General Exercises - Upper Extremity Shoulder Flexion: AAROM;PROM;5 reps;Right;Seated Elbow Flexion: AAROM;PROM;Right;10 reps;Seated Elbow Extension: PROM;Right;10 reps   PT Diagnosis:    PT Problem List:   PT Treatment Interventions:     PT Goals Acute Rehab PT Goals Time For Goal Achievement: 02/06/13 Potential to Achieve Goals: Good Pt will go Supine/Side to Sit: Independently PT Goal: Supine/Side to Sit - Progress: Progressing toward goal Pt will go Sit to Stand: with supervision PT Goal: Sit to Stand - Progress: Progressing toward goal Pt will go Stand to Sit: with supervision PT Goal: Stand to Sit - Progress: Progressing toward goal Pt will Stand: with supervision;3 - 5 min;with no upper extremity support PT Goal: Stand - Progress: Progressing toward goal Pt will Ambulate: >150 feet;with supervision;with least restrictive assistive device PT Goal: Ambulate - Progress: Progressing toward goal  Visit Information  Last PT Received On: 01/25/13 Assistance Needed: +2    Subjective Data  Subjective: I will   Cognition  Cognition Overall Cognitive Status: Impaired Area of Impairment: Memory;Following commands;Safety/judgement;Awareness of errors;Awareness of deficits;Problem solving Arousal/Alertness: Awake/alert Orientation Level: Appears intact for tasks assessed Behavior During Session: [facility] for tasks performed Following Commands: Follows one step commands consistently Safety/Judgement: Impulsive;Decreased awareness of need for assistance;Decreased safety judgement for tasks assessed Safety/Judgement - Other Comments: pt fell earlier today;    Balance  Static Sitting Balance Static Sitting - Balance Support: Left upper extremity supported Static Sitting - Level of Assistance: 5: Stand by assistance Dynamic Standing Balance Dynamic  Standing - Balance Support: Bilateral upper extremity supported Dynamic Standing - Level of Assistance: 4: Min assist;3: Mod assist  End of Session PT - End of Session Equipment  Utilized During Treatment: Gait belt Activity Tolerance: Patient tolerated treatment well Patient left: in chair;with call bell/phone within reach;with family/visitor present Nurse Communication: Mobility status   GP     [facility] 01/25/2013, 12:54 PM

## 2013-01-25 NOTE — Progress Notes (Addendum)
Pt currently does not have any bed offers in [facility]. CSW in process of contacting facilities that have not yet responded. CSW submitted pasarr and will have to submit additional information as pt has a history of anxiety and depression.  CSW will follow up with pt and pt wife re: responses from [facility] facilities and discussing expanding SNF search to [facility] as secondary options.  Pt also has an insurance that will require authorization prior to pt being discharged to SNF.   Addendum 5:00pm:   CSW spoke with [facility] [place] which is pt and pt family first choice for SNF and facility feels they can meet pt clinical needs however facility has to speak with pt insurance re: coverage and authorization for SNF placement. CSW faxed updated clinicals to facility from today as this will be needed for insurance authorization. Facility contact CSW after discuss with insurance company tomorrow.   CSW faxed additional information to pasarr and awaiting pasarr number to be assigned to pt.  CSW updated pt wife at bedside and provided emotional support. Pt sleeping at this time.   CSW to continue to follow to assist with disposition needs. Current barrier is pt insurance which require authorization for SNF placement prior to pt discharge from hospital and awaiting pasarr number.  Jacklynn Lewis, MSW, LCSWA  Clinical Social Work [phone]

## 2013-01-26 ENCOUNTER — Ambulatory Visit
Admit: 2013-01-26 | Discharge: 2013-01-26 | Disposition: A | Discharge status: Home / Self Care | Payer: BC Managed Care – PPO | Attending: Radiation Oncology | Admitting: Radiation Oncology

## 2013-01-26 DIAGNOSIS — J449 Chronic obstructive pulmonary disease, unspecified: Secondary | ICD-10-CM

## 2013-01-26 DIAGNOSIS — F411 Generalized anxiety disorder: Secondary | ICD-10-CM

## 2013-01-26 DIAGNOSIS — R5381 Other malaise: Secondary | ICD-10-CM

## 2013-01-26 DIAGNOSIS — R569 Unspecified convulsions: Secondary | ICD-10-CM

## 2013-01-26 MED ORDER — LEVETIRACETAM 500 MG PO TABS
500.0000 mg | ORAL_TABLET | Freq: Two times a day (BID) | ORAL | Status: DC
  Administered 2013-01-26: 500 mg via ORAL
  Filled 2013-01-26 (×4): qty 1

## 2013-01-26 MED ORDER — SUCRALFATE 1 GM/10ML PO SUSP
1.0000 g | Freq: Three times a day (TID) | ORAL | Status: DC
  Administered 2013-01-26 – 2013-01-29 (×12): 1 g via ORAL
  Filled 2013-01-26 (×15): qty 10

## 2013-01-26 NOTE — Progress Notes (Signed)
CSW continuing to follow for disposition planning.  [facility] has submitted pt clinicals to [employer] and awaiting response re: insurance authorization for SNF placement. [employer] [facility] [place] that processing of clinicals for insurance authorization could take up to 48 hours.   CSW checked with [facility] at 4:30pm and pt insurance was still pending authorization.  CSW updated MD and pt and pt wife at bedside.  CSW to continue to follow and facilitate pt discharge when insurance authorization received.  Jacklynn Lewis, MSW, LCSWA  Clinical Social Work [phone]

## 2013-01-26 NOTE — Progress Notes (Signed)
Occupational Therapy Treatment Patient Details Name: Derrick Crosby MRN: [id number] DOB: 20-Jun-1951 Today's Date: 01/26/2013 Time: 7829-5621 OT Time Calculation (min): 37 min  OT Assessment / Plan / Recommendation Comments on Treatment Session Pt with minor gains in hand AROM.  Pt very motivated    Follow Up Recommendations  SNF    Barriers to Discharge       Equipment Recommendations  3 in 1 bedside comode    Recommendations for Other Services    Frequency     Plan Discharge plan needs to be updated    Precautions / Restrictions Precautions Precautions: Fall Precaution Comments: impulsive Restrictions Weight Bearing Restrictions: No   Pertinent Vitals/Pain No c/o pain    ADL  Lower Body Bathing: Moderate assistance Where Assessed - Lower Body Bathing: Supported sit to stand; min to mod A given for balance support while pt washed front and back peri areas Upper Body Dressing: Moderate assistance Where Assessed - Upper Body Dressing: Unsupported sitting Toilet Transfer: Simulated;Moderate assistance Toilet Transfer Method: Stand pivot Toilet Transfer Equipment:  (bed to chair) Transfers/Ambulation Related to ADLs: bed mobility mod A, pt rolled to L with min A and R with supervision.  Educated to protect RUE ADL Comments: Worked on bathing and UE exercises:  pt able to oppose better--can reach to 4th digit now.  Flexors remain stronger group:  did not palpate delts at all today.  Able to elict weak triceps and wrist extensor contraction.  Pt can actively extend fingers    OT Diagnosis:    OT Problem List:   OT Treatment Interventions:     OT Goals Acute Rehab OT Goals Time For Goal Achievement: 02/06/13 ADL Goals Pt Will Perform Lower Body Bathing: with min assist;Sit to stand from chair ADL Goal: Lower Body Bathing - Progress: Progressing toward goals Pt Will Transfer to Toilet: with min assist;Stand pivot transfer;3-in-1 ADL Goal: Toilet Transfer - Progress:  Progressing toward goals Arm Goals Additional Arm Goal #1: pt will be supervision with HEP for RUE strengthening and coordination as well as positioning RUE in bed/chair (fingers extended) Arm Goal: Additional Goal #1 - Progress: Progressing toward goals Miscellaneous OT Goals Miscellaneous OT Goal #1: Pt will pt will maintain min dynamic balance during LB adls with min A x 2 minutes OT Goal: Miscellaneous Goal #1 - Progress: Progressing toward goals Miscellaneous OT Goal #2: Pt will attend to RUE positioning on/off surface and walker with 1 vc OT Goal: Miscellaneous Goal #2 - Progress: Progressing toward goals  Visit Information  Last OT Received On: 01/26/13 Assistance Needed: +2 (spt +1)    Subjective Data      Prior Functioning       Cognition  Cognition Overall Cognitive Status: Impaired Area of Impairment: Safety/judgement Arousal/Alertness: Awake/alert Orientation Level: Appears intact for tasks assessed Behavior During Session: [facility] for tasks performed Following Commands: Follows one step commands consistently Safety/Judgement: Impulsive    Mobility  Bed Mobility Rolling Right: 4: Min assist Rolling Left: 5: Supervision Right Sidelying to Sit: 3: Mod assist;4: Min assist Details for Bed Mobility Assistance: cues for RUE position Transfers Sit to Stand: 3: Mod assist;From bed;From chair/3-in-1 Details for Transfer Assistance: vcs for hand placement    Exercises  General Exercises - Upper Extremity Shoulder Flexion: AAROM;PROM;5 reps;Right;Sidelying;Supine Shoulder Horizontal ABduction: PROM;5 reps Shoulder Horizontal ADduction: AAROM;5 reps;Seated Elbow Flexion: AAROM;5 reps;Seated Elbow Extension: PROM;10 reps;Seated Hand Exercises Digit Composite Flexion: AROM;5 reps;Seated Composite Extension: AROM;5 reps;Seated Other Exercises Other Exercises: opposition x 5 reps,  2nd 3rd and 4th digits   Balance Static Sitting Balance Static Sitting - Balance Support:  No upper extremity supported Static Sitting - Level of Assistance: 5: Stand by assistance   End of Session OT - End of Session Activity Tolerance: Patient tolerated treatment well Patient left: in chair;with call bell/phone within reach;with family/visitor present  GO     Gar Glance 01/26/2013, 2:59 PM Marica Otter, OTR/L [phone] 01/26/2013

## 2013-01-26 NOTE — Progress Notes (Signed)
Follow up. Insurance is a big concern for patient's wife, [name]. She really wants patient to go to rehabilitation in [place] near her home. Her driving ability is limited. Listening; presence; support; prayer.

## 2013-01-26 NOTE — Progress Notes (Signed)
[facility] PROGRESS NOTE  Derrick Crosby ZOX:[id number] DOB: 07/26/1951 DOA: 01/22/2013 PCP: Arlyss Queen  Brief narrative: 62 y.o. left handed male with a PMH of metastatic non-small cell lung cancer diagnosed 06/2012, under the care of Dr. Arbutus Ped, status post systemic chemotherapy and XRT who was admitted 01/22/2013 with a two-week history of progressive weakness progressing to paralysis of his right upper arm and hand. 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 Radiation oncology has been consulted and the patient has been placed on Decadron. He also developed the acute onset of a tonic clonic seizure on 01/23/2013. Now on Keppra in addition to Decadron. SW assisting discharge planning.  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 Based on CT head and MRI brain there is a vasogenic edema in the subcortical left frontal lobe.  Continue decadron 6mg  IV Q 6 hours and Keppra 500 mg Q 12 hours (change to PO regimen)  Appreciated radiation oncology following. Continue radiation treatment as planned. SW assisting discharge plan to SNF Active Problems:  Tonic clonic seizure  Patient developed an acute tonic-clonic seizure on 01/23/2013. Given 2 mg of Ativan. No further seizure activity reported.  Keppra 500 mg PO every 12 hours started for seizure prophylaxis.  Depression with anxiety  Continue Celexa, Elavil, and Klonopin.  Squamous cell lung cancer  Per oncology COPD (chronic obstructive pulmonary disease)  Continue albuterol 2 puffs every 6 hours as needed for wheezing. Continue Mucinex and Hycodan for cough.  Code Status: Full.  Family Communication: [name], wife, [phone] updated at bedside.  Disposition Plan: SNF when accepting facility found.   Medical Consultants:  Radiation oncology Other Consultants:  Physical therapy   Occupational therapy Anti-infectives:  None.  [name], MD  [facility]  Pager [phone]    If 7PM-7AM, please contact night-coverage [email or web address] Password [email or web address] 01/26/2013, 1:16 PM   LOS: 4 days    HPI/Subjective: No acute overnight events.  Objective: Filed Vitals:   01/25/13 0610 01/25/13 1300 01/25/13 2140 01/26/13 0607  BP: 144/78 130/80 164/83 130/67  Pulse: 101 79 82 67  Temp: 97.1 F (36.2 C) 97.7 F (36.5 C) 98.8 F (37.1 C) 97.1 F (36.2 C)  TempSrc: Oral  Oral Axillary  Resp: 20 16 18 16   Height:      Weight:      SpO2: 96% 95% 93% 92%    Intake/Output Summary (Last 24 hours) at 01/26/13 1316 Last data filed at 01/26/13 0900  Gross per 24 hour  Intake   1150 ml  Output    350 ml  Net    800 ml    Exam:   General:  Pt is alert, follows commands appropriately, not in acute distress  Cardiovascular: Regular rate and rhythm, S1/S2, no murmurs, no rubs, no gallops  Respiratory: Clear to auscultation bilaterally, no wheezing, no crackles, no rhonchi  Abdomen: Soft, non tender, non distended, bowel sounds present, no guarding  Extremities: No edema, pulses DP and PT palpable bilaterally  Neuro: Grossly nonfocal  Data Reviewed: Basic Metabolic Panel:  Recent Labs Lab 01/22/13 1240  NA 137  K 4.1  CL 101  CO2 25  GLUCOSE 98  BUN 17  CREATININE 1.04  CALCIUM 9.9   Liver Function Tests:  Recent Labs Lab 01/22/13 1240  AST 27  ALT 40  ALKPHOS 116  BILITOT 0.4  PROT 7.2  ALBUMIN 3.7  No results found for this basename: LIPASE, AMYLASE,  in the last 168 hours No results found for this basename: AMMONIA,  in the last 168 hours CBC:  Recent Labs Lab 01/22/13 1240  WBC 8.5  NEUTROABS 6.0  HGB 13.8  HCT 39.6  MCV 93.6  PLT 179   Cardiac Enzymes: No results found for this basename: CKTOTAL, CKMB, CKMBINDEX, TROPONINI,  in the last 168 hours BNP: No components found with this basename: POCBNP,  CBG: No results found for  this basename: GLUCAP,  in the last 168 hours  MRSA PCR SCREENING     Status: Abnormal   Collection Time    01/22/13  9:43 PM      Result Value Range Status   MRSA by PCR POSITIVE (*) NEGATIVE Final     Studies: No results found.  Scheduled Meds: . amitriptyline  25 mg Oral QHS  . aspirin EC  81 mg Oral Daily  . citalopram  20 mg Oral QPM  . dexamethasone  6 mg Intravenous Q6H  . enoxaparin (LOVENOX)   40 mg Subcutaneous Q24H  . gabapentin  600 mg Oral TID  . guaiFENesin  1,200 mg Oral BID  . levETIRAcetam  500 mg Oral BID  . loratadine  10 mg Oral Daily  . mupirocin ointment  1 application Nasal BID  . omega-3 acid ethyl   1 g Oral Daily  . polyethylene glycol  17 g Oral Daily  . sodium chloride  3 mL Intravenous Q12H  . sucralfate  1 g Oral QID  . triamcinolone cream  1 application Topical BID

## 2013-01-27 DIAGNOSIS — C3400 Malignant neoplasm of unspecified main bronchus: Secondary | ICD-10-CM

## 2013-01-27 LAB — CBC
MCH: 32.1 pg (ref 26.0–34.0)
MCHC: 35.5 g/dL (ref 30.0–36.0)
MCV: 90.4 fL (ref 78.0–100.0)
Platelets: 200 10*3/uL (ref 150–400)
RBC: 3.96 MIL/uL — ABNORMAL LOW (ref 4.22–5.81)
RDW: 13 % (ref 11.5–15.5)

## 2013-01-27 LAB — BASIC METABOLIC PANEL
CO2: 26 mEq/L (ref 19–32)
Calcium: 9.4 mg/dL (ref 8.4–10.5)
Creatinine, Ser: 0.92 mg/dL (ref 0.50–1.35)
GFR calc non Af Amer: 87 mL/min — ABNORMAL LOW (ref >90)
Sodium: 136 mEq/L (ref 135–145)

## 2013-01-27 MED ORDER — ACETAMINOPHEN 160 MG/5ML PO SOLN: 650.0000 mg | Freq: Four times a day (QID) | ORAL | Status: DC | PRN

## 2013-01-27 MED ORDER — LEVETIRACETAM 100 MG/ML PO SOLN
500.0000 mg | Freq: Two times a day (BID) | ORAL | Status: DC
  Administered 2013-01-27 – 2013-01-29 (×5): 500 mg via ORAL
  Filled 2013-01-27 (×6): qty 5

## 2013-01-27 MED ORDER — CITALOPRAM HYDROBROMIDE 10 MG/5ML PO SOLN
20.0000 mg | Freq: Every day | ORAL | Status: DC
  Administered 2013-01-27 – 2013-01-28 (×2): 20 mg via ORAL
  Filled 2013-01-27 (×3): qty 10

## 2013-01-27 NOTE — Progress Notes (Signed)
[facility] PROGRESS NOTE  Derrick Crosby ZOX:[id number] DOB: [birth year] DOA: 01/22/2013 PCP: Arlyss Queen  Brief narrative: 62 y.o. left handed male with a PMH of metastatic non-small cell lung cancer diagnosed 06/2012, under the care of Dr. Arbutus Ped, status post systemic chemotherapy and XRT who was admitted 01/22/2013 with a two-week history of progressive weakness progressing to paralysis of his right upper arm and hand. 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 Radiation oncology has been consulted and the patient has been placed on Decadron. He also developed the acute onset of a tonic clonic seizure on 01/23/2013. Now on Keppra in addition to Decadron. SW assisting discharge planning.   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 Based on CT head and MRI brain there is a vasogenic edema in the subcortical left frontal lobe.  Continue decadron 6mg  IV Q 6 hours and Keppra 500 mg Q 12 hours PO Per radiation oncology continue radiation treatment as planned.  D/C plan to SNF Active Problems:  Tonic clonic seizure  Patient developed an acute tonic-clonic seizure on 01/23/2013. Given 2 mg of Ativan. No further seizure activity reported.  Continue Keppra 500 mg PO every 12 hours  Depression with anxiety  Continue Celexa, Elavil, and Klonopin.  Squamous cell lung cancer  Management per oncology COPD (chronic obstructive pulmonary disease)  Continue albuterol 2 puffs every 6 hours as needed for wheezing.  Continue Mucinex and Hycodan for cough.   Code Status: Full.  Family Communication: [name], wife, [phone] updated at bedside.  Disposition Plan: SNF when accepting facility found.    Medical Consultants:  Radiation oncology Other Consultants:  Physical therapy  Occupational therapy Anti-infectives:  None.   [name], MD  [facility]  Pager [phone]    If 7PM-7AM, please contact night-coverage [email or web address] Password [email or web address] 01/27/2013, 5:39 AM   LOS: 5 days    HPI/Subjective: No acute overnight events.  Objective: Filed Vitals:   01/25/13 2140 01/26/13 0607 01/26/13 1438 01/26/13 2120  BP: 164/83 130/67 171/87 146/86  Pulse: 82 67 79 72  Temp: 98.8 F (37.1 C) 97.1 F (36.2 C) 97.9 F (36.6 C) 98.3 F (36.8 C)  TempSrc: Oral Axillary Oral Oral  Resp: 18 16 18 16   Height:      Weight:      SpO2: 93% 92% 97% 97%    Intake/Output Summary (Last 24 hours) at 01/27/13 0539 Last data filed at 01/27/13 0100  Gross per 24 hour  Intake    737 ml  Output    925 ml  Net   -188 ml    Exam:   General:  Pt is sleeping, not in acute distress  Cardiovascular: Regular rate and rhythm, S1/S2, no murmurs, no rubs, no gallops  Respiratory: Clear to auscultation bilaterally, no wheezing, no crackles, no rhonchi  Abdomen: Soft, non tender, non distended, bowel sounds present, no guarding  Extremities: No edema, pulses DP and PT palpable bilaterally  Neuro: Grossly nonfocal  Data Reviewed: Basic Metabolic Panel:  Recent Labs Lab 01/22/13 1240  NA 137  K 4.1  CL 101  CO2 25  GLUCOSE 98  BUN 17  CREATININE 1.04  CALCIUM 9.9   Liver Function Tests:  Recent Labs Lab 01/22/13 1240  AST 27  ALT 40  ALKPHOS 116  BILITOT 0.4  PROT 7.2  ALBUMIN 3.7   No results found for this  basename: LIPASE, AMYLASE,  in the last 168 hours No results found for this basename: AMMONIA,  in the last 168 hours CBC:  Recent Labs Lab 01/22/13 1240 01/27/13 0500  WBC 8.5 13.5*  NEUTROABS 6.0  --   HGB 13.8 12.7*  HCT 39.6 35.8*  MCV 93.6 90.4  PLT 179 200   Cardiac Enzymes: No results found for this basename: CKTOTAL, CKMB, CKMBINDEX, TROPONINI,  in the last 168 hours BNP: No components found with this basename: POCBNP,  CBG: No results found for this basename: GLUCAP,  in the last 168  hours  Recent Results (from the past 240 hour(s))  MRSA PCR SCREENING     Status: Abnormal   Collection Time    01/22/13  9:43 PM      Result Value Range Status   MRSA by PCR POSITIVE (*) NEGATIVE Final     Studies: No results found.  Scheduled Meds: . amitriptyline  25 mg Oral QHS  . aspirin EC  81 mg Oral Daily  . [COMPLETED] Chlorhexidine Gluconate Cloth  6 each Topical Q0600  . citalopram  20 mg Oral QPM  . dexamethasone  6 mg Intravenous Q6H  . enoxaparin (LOVENOX) injection  40 mg Subcutaneous Q24H  . gabapentin  600 mg Oral TID  . guaiFENesin  1,200 mg Oral BID  . levETIRAcetam  500 mg Oral BID  . loratadine  10 mg Oral Daily  . mupirocin ointment  1 application Nasal BID  . omega-3 acid ethyl esters  1 g Oral Daily  . polyethylene glycol  17 g Oral Daily  . sodium chloride  3 mL Intravenous Q12H  . sucralfate  1 g Oral TID WC & HS  . triamcinolone cream  1 application Topical BID   Continuous Infusions:

## 2013-01-28 DIAGNOSIS — C7931 Secondary malignant neoplasm of brain: Principal | ICD-10-CM

## 2013-01-28 DIAGNOSIS — D72829 Elevated white blood cell count, unspecified: Secondary | ICD-10-CM | POA: Diagnosis present

## 2013-01-28 DIAGNOSIS — C801 Malignant (primary) neoplasm, unspecified: Secondary | ICD-10-CM

## 2013-01-28 MED ORDER — PANTOPRAZOLE SODIUM 40 MG IV SOLR
40.0000 mg | Freq: Two times a day (BID) | INTRAVENOUS | Status: DC
  Administered 2013-01-28 – 2013-01-29 (×3): 40 mg via INTRAVENOUS
  Filled 2013-01-28 (×4): qty 40

## 2013-01-28 MED ORDER — FLUCONAZOLE IN SODIUM CHLORIDE 200-0.9 MG/100ML-% IV SOLN
200.0000 mg | INTRAVENOUS | Status: DC
  Administered 2013-01-28 – 2013-01-29 (×2): 200 mg via INTRAVENOUS
  Filled 2013-01-28 (×2): qty 100

## 2013-01-28 NOTE — Progress Notes (Signed)
[facility] PROGRESS NOTE  Derrick Crosby:[id number] DOB: 03/01/1951 DOA: 01/22/2013 PCP: Arlyss Queen  Brief narrative: 62 y.o. left handed male with a PMH of metastatic non-small cell lung cancer diagnosed 06/2012, under the care of Dr. Arbutus Ped, status post systemic chemotherapy and XRT who was admitted 01/22/2013 with a two-week history of progressive weakness progressing to paralysis of his right upper arm and hand. 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 Radiation oncology has been consulted and the patient has been placed on Decadron. He also developed the acute onset of a tonic clonic seizure on 01/23/2013. Now on Keppra in addition to Decadron. SW assisting discharge planning.  Overnight, patient developed difficulty swallowing. Started fluconazole and awaiting SLP evaluation.  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 Based on CT head and MRI brain there is a vasogenic edema in the subcortical left frontal lobe.  Continue decadron 6mg  IV Q 6 hours and Keppra 500 mg Q 12 hours PO  Per radiation oncology continue radiation treatment as planned.  D/C plan to SNF when stable Active Problems:  Dysphagia  SLP evaluation  Add protonix 40 mg Q 12 hours IV  Start fluconazole for possible esophagitis considering patient is on decadron Leukocytosis  Secondary to steroids Tonic clonic seizure  Patient developed an acute tonic-clonic seizure on 01/23/2013. Given 2 mg of Ativan. No further seizure activity reported.  Continue Keppra 500 mg PO every 12 hours  Depression with anxiety  Continue Celexa, Elavil, and Klonopin.  Squamous cell lung cancer  Management per oncology COPD (chronic obstructive pulmonary disease)  Continue albuterol 2 puffs every 6 hours as needed for wheezing.  Continue Mucinex and Hycodan for  cough.   Code Status: Full.  Family Communication: [name], wife, [phone] updated at bedside.  Disposition Plan: SNF when accepting facility found.    Medical Consultants:  Radiation oncology Other Consultants:  Physical therapy  Occupational therapy Anti-infectives:  None.   [name], MD  [facility]  Pager [phone]   If 7PM-7AM, please contact night-coverage [email or web address] Password [email or web address] 01/28/2013, 5:56 AM   LOS: 6 days    HPI/Subjective: No overnight events. Complains of cough, difficulty swallowing.  Objective: Filed Vitals:   01/27/13 0544 01/27/13 1357 01/27/13 2141 01/28/13 0236  BP: 125/82 131/79 161/87   Pulse: 66 74 75   Temp: 98 F (36.7 C) 98 F (36.7 C) 98.4 F (36.9 C)   TempSrc:  Oral Oral   Resp: 18 16 18    Height:      Weight:      SpO2: 96% 97% 97% 98%    Intake/Output Summary (Last 24 hours) at 01/28/13 0556 Last data filed at 01/28/13 0325  Gross per 24 hour  Intake      0 ml  Output   1075 ml  Net  -1075 ml    Exam:   General:  Pt is alert, follows commands appropriately, not in acute distress  Cardiovascular: Regular rate and rhythm, S1/S2, no murmurs, no rubs, no gallops  Respiratory: Clear to auscultation bilaterally, no wheezing, no crackles, no rhonchi  Abdomen: Soft, non tender, non distended, bowel sounds present, no guarding  Extremities: No edema, pulses DP and PT palpable bilaterally  Neuro: Grossly nonfocal  Data Reviewed: Basic Metabolic Panel:  Recent Labs Lab 01/22/13 1240 01/27/13 0500  NA 137 136  K 4.1 3.5  CL 101 99  CO2 25 26  GLUCOSE 98 124*  BUN 17 33*  CREATININE 1.04 0.92  CALCIUM 9.9 9.4   Liver Function Tests:  Recent Labs Lab 01/22/13 1240  AST 27  ALT 40  ALKPHOS 116  BILITOT 0.4  PROT 7.2  ALBUMIN 3.7   No results found for this basename: LIPASE, AMYLASE,  in the last 168 hours No results found for this basename: AMMONIA,  in the last 168 hours CBC:  Recent Labs Lab  01/22/13 1240 01/27/13 0500  WBC 8.5 13.5*  NEUTROABS 6.0  --   HGB 13.8 12.7*  HCT 39.6 35.8*  MCV 93.6 90.4  PLT 179 200   Cardiac Enzymes: No results found for this basename: CKTOTAL, CKMB, CKMBINDEX, TROPONINI,  in the last 168 hours BNP: No components found with this basename: POCBNP,  CBG: No results found for this basename: GLUCAP,  in the last 168 hours  Recent Results (from the past 240 hour(s))  MRSA PCR SCREENING     Status: Abnormal   Collection Time    01/22/13  9:43 PM      Result Value Range Status   MRSA by PCR POSITIVE (*) NEGATIVE Final     Studies: No results found.  Scheduled Meds: . amitriptyline  25 mg Oral QHS  . aspirin EC  81 mg Oral Daily  . citalopram  20 mg Oral q1800  . dexamethasone  6 mg Intravenous Q6H  . enoxaparin (LOVENOX)   40 mg Subcutaneous Q24H  . gabapentin  600 mg Oral TID  . guaiFENesin  1,200 mg Oral BID  . levETIRAcetam  500 mg Oral BID  . loratadine  10 mg Oral Daily  . omega-3 acid ethyl esters  1 g Oral Daily  . polyethylene glycol  17 g Oral Daily  . sucralfate  1 g Oral TID WC & HS  . triamcinolone cream  1 application Topical BID

## 2013-01-29 ENCOUNTER — Ambulatory Visit
Admit: 2013-01-29 | Discharge: 2013-01-29 | Disposition: A | Discharge status: Home / Self Care | Payer: BC Managed Care – PPO | Attending: Radiation Oncology | Admitting: Radiation Oncology

## 2013-01-29 LAB — CREATININE, SERUM: Creatinine, Ser: 1.03 mg/dL (ref 0.50–1.35)

## 2013-01-29 MED ORDER — CHLORPROMAZINE HCL 10 MG PO TABS: 10.0000 mg | ORAL_TABLET | Freq: Four times a day (QID) | ORAL | Status: AC | PRN

## 2013-01-29 MED ORDER — CLONAZEPAM 0.5 MG PO TABS: 0.5000 mg | ORAL_TABLET | Freq: Two times a day (BID) | ORAL | Status: AC | PRN

## 2013-01-29 MED ORDER — CITALOPRAM HYDROBROMIDE 20 MG PO TABS: 20.0000 mg | ORAL_TABLET | Freq: Every evening | ORAL | Status: AC

## 2013-01-29 MED ORDER — AMITRIPTYLINE HCL 25 MG PO TABS: 25.0000 mg | ORAL_TABLET | Freq: Every day | ORAL | Status: DC

## 2013-01-29 MED ORDER — MAGIC MOUTHWASH W/LIDOCAINE
5.0000 mL | Freq: Four times a day (QID) | ORAL | Status: DC | PRN
  Administered 2013-01-29: 5 mL via ORAL
  Filled 2013-01-29: qty 5

## 2013-01-29 MED ORDER — FLUCONAZOLE 10 MG/ML PO SUSR: 100.0000 mg | Freq: Every day | ORAL | Status: AC

## 2013-01-29 MED ORDER — ALUM & MAG HYDROXIDE-SIMETH 200-200-20 MG/5ML PO SUSP: 30.0000 mL | Freq: Four times a day (QID) | ORAL | Status: AC | PRN

## 2013-01-29 MED ORDER — HEPARIN SOD (PORK) LOCK FLUSH 100 UNIT/ML IV SOLN
INTRAVENOUS | Status: AC
  Administered 2013-01-29: 16:00:00
  Filled 2013-01-29: qty 5

## 2013-01-29 MED ORDER — DEXAMETHASONE 4 MG PO TABS: 4.0000 mg | ORAL_TABLET | Freq: Four times a day (QID) | ORAL | Status: DC

## 2013-01-29 MED ORDER — HYDROCODONE-HOMATROPINE 5-1.5 MG/5ML PO SYRP: 5.0000 mL | ORAL_SOLUTION | Freq: Four times a day (QID) | ORAL | Status: AC | PRN

## 2013-01-29 MED ORDER — OMEGA-3-ACID ETHYL ESTERS 1 G PO CAPS: 1.0000 g | ORAL_CAPSULE | Freq: Every day | ORAL | Status: AC

## 2013-01-29 MED ORDER — MAGIC MOUTHWASH W/LIDOCAINE: 5.0000 mL | Freq: Four times a day (QID) | ORAL | Status: DC | PRN

## 2013-01-29 MED ORDER — POLYETHYLENE GLYCOL 3350 17 G PO PACK: 17.0000 g | PACK | Freq: Every day | ORAL | Status: DC

## 2013-01-29 MED ORDER — LEVETIRACETAM 100 MG/ML PO SOLN: 500.0000 mg | Freq: Two times a day (BID) | ORAL | Status: DC

## 2013-01-29 NOTE — Progress Notes (Signed)
CSW received notification from [facility] that facility received insurance authorization, but pt family would need to provide transportation for pt to radiation treatments.  CSW notified pt and pt wife at bedside. Pt wife was already aware that likely family would have to provide pt transportation to radiation.   CSW notified MD.  CSW to facilitate pt discharge needs this afternoon.   Jacklynn Lewis, MSW, LCSWA  Clinical Social Work [phone]

## 2013-01-29 NOTE — Discharge Summary (Signed)
Physician Discharge Summary  Derrick Crosby:[id number] DOB: [birth year] DOA: 01/22/2013  PCP: Arlyss Queen  Admit date: 01/22/2013 Discharge date: 01/29/2013  Recommendations for Outpatient Follow-up:  1. Please continue same radiation treatments per radiation oncology scheduled. 2. Continue fluconazole 100 mg daily for 2 weeks from discharge 3. Continue Decadron 4 mg every 6 hours by mouth. Followup with radiation oncology in regards to tapering Decadron.  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  Diet recommendation: As tolerated  History of present illness:  62 y.o. left handed male with a PMH of metastatic non-small cell lung cancer diagnosed 06/2012, under the care of Dr. Arbutus Ped, status post systemic chemotherapy and XRT who was admitted 01/22/2013 with a two-week history of progressive weakness progressing to paralysis of his right upper arm and hand. 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 Radiation oncology has been consulted and the patient has been placed on Decadron. He also developed the acute onset of a tonic clonic seizure on 01/23/2013. Now on Keppra in addition to Decadron. SW assisting discharge planning.  Overnight, patient developed difficulty swallowing. Started fluconazole which will be continued for 2 weeks from discharge.  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 Based on CT head and MRI brain there is a vasogenic edema in the subcortical left frontal lobe.  Continue decadron  4 mg every 6 hours by mouth and Keppra  500 mg Q 12 hours PO  Per radiation oncology continue radiation treatment as planned. Radiation oncology will instruct the patient on tapering Decadron D/C plan to SNFtoday Active Problems:  Dysphagia   Perhaps related to an esophageal thrush  SLP evaluation   Started fluconazole for possible esophagitis considering patient is on decadron Leukocytosis   Secondary to steroids Tonic clonic seizure  Patient developed an acute tonic-clonic seizure on 01/23/2013. Given 2 mg of Ativan. No further seizure activity reported.  Continue Keppra 500 mg PO every 12 hours  Depression with anxiety  Continue Celexa, Elavil, and Klonopin.  Squamous cell lung cancer  Management per oncology COPD (chronic obstructive pulmonary disease)  Continue albuterol 2 puffs every 6 hours as needed for wheezing.  Continue Mucinex and Hycodan for cough.   Code Status: Full.  Family Communication: [name], wife, [phone] updated at bedside.  .   Medical Consultants:  Radiation oncology Other Consultants:  Physical therapy  Occupational therapy Anti-infectives:  Fluconazole to be continued for 2 weeks upon discharge.   [name], MD  [facility]  Pager [phone]      Discharge Exam: Filed Vitals:   01/29/13 0542  BP: 144/78  Pulse: 60  Temp: 97.9 F (36.6 C)  Resp: 20   Filed Vitals:   01/28/13 0607 01/28/13 1739 01/28/13 2201 01/29/13 0542  BP: 134/64 149/89 129/80 144/78  Pulse: 68 81 58 60  Temp: 99.1 F (37.3 C) 98.5 F (36.9 C) 98.4 F (36.9 C) 97.9 F (36.6 C)  TempSrc: Oral Oral Oral Oral  Resp: 20 18 16 20   Height:      Weight:      SpO2: 97% 99% 95% 98%    General: Pt is alert,  follows commands appropriately, not in acute distress Cardiovascular: Regular rate and rhythm, S1/S2 +, no murmurs, no rubs, no gallops Respiratory: Clear to auscultation bilaterally, no wheezing, no crackles, no rhonchi Abdominal: Soft, non tender, non distended, bowel sounds +, no  guarding Extremities: no edema, no cyanosis, pulses palpable bilaterally DP and PT Neuro: Grossly nonfocal  Discharge Instructions  Discharge Orders   Future Appointments Provider Department Dept Phone   01/30/2013 11:50 AM [facility] [id number] [facility] RAD[facility] [phone]   01/31/2013 2:15 PM [facility] [id number] [facility] [phone]   02/01/2013 11:10 AM [facility] [id number] [facility] [phone]   02/02/2013 2:15 PM [facility] [id number] [facility] [phone]   02/05/2013 2:35 PM [facility] [id number] [facility] [phone]   02/06/2013 2:35 PM [facility] [id number] [facility] [phone]   02/07/2013 2:35 PM [facility] [id number] [facility] [phone]   02/08/2013 2:35 PM [facility] [id number] [facility]COL[facility] [phone]   02/09/2013 2:35 PM [facility] [id number] [facility] RAD[facility] ONCOL[facility] [phone]   02/12/2013 2:35 PM [facility] [id number] [facility]COL[facility] [phone]   02/13/2013 2:35 PM [facility] [id number] [facility] [phone]   02/28/2013 9:00 AM Dava Najjar [facility] MEDICAL ONCOLOGY [phone]   02/28/2013 10:00 AM [name] [phone]   Patient to arrive 15 minutes prior to appointment time. Patient to pick up oral contrast at least 1 day prior to exam. No solid food 4 hours prior to exam. Liquids and Medicines are okay.   03/01/2013 10:00 AM [name], MD [facility] MEDICAL ONCOLOGY [phone]   Future Orders Complete By Expires     Call MD for:  difficulty breathing, headache or visual disturbances  As directed     Call MD for:  persistant dizziness or  light-headedness  As directed     Call MD for:  persistant nausea and vomiting  As directed     Call MD for:  severe uncontrolled pain  As directed     Diet - low sodium heart healthy  As directed     Discharge instructions  As directed     Comments:      Please take fluconazole 100 mg daily suspension for next 2 weeks. Continue taking Decadron 4 mg every 6 hours by mouth. Radiation oncology we will taper this medication on subsequent radiation treatments    Increase activity slowly  As directed         Medication List    TAKE these medications       acetaminophen 325 MG tablet  Commonly known as:  TYLENOL  Take 650 mg by mouth every 6 (six) hours as needed. For general aches     albuterol 108 (90 BASE) MCG/ACT inhaler  Commonly known as:  PROVENTIL HFA;VENTOLIN HFA  Inhale 2 puffs into the lungs every 6 (six) hours as needed for wheezing.     alum & mag hydroxide-simeth 200-200-20 MG/5ML suspension  Commonly known as:  MAALOX/MYLANTA  Take 30 mLs by mouth every 6 (six) hours as needed.     amitriptyline 25 MG tablet  Commonly known as:  ELAVIL  Take 1 tablet (25 mg total) by mouth at bedtime.     aspirin EC 81 MG tablet  Take 81 mg by mouth daily.  chlorproMAZINE 10 MG tablet  Commonly known as:  THORAZINE  Take 1 tablet (10 mg total) by mouth 4 (four) times daily as needed.     citalopram 20 MG tablet  Commonly known as:  CELEXA  Take 1 tablet (20 mg total) by mouth every evening.     clonazePAM 0.5 MG tablet  Commonly known as:  KLONOPIN  Take 1 tablet (0.5 mg total) by mouth 2 (two) times daily as needed for anxiety. For anxiety     fish oil-omega-3 fatty acids 1000 MG capsule  Take 1 g by mouth daily.     fluconazole 10 MG/ML suspension  Commonly known as:  DIFLUCAN  Take 10 mLs (100 mg total) by mouth daily.     gabapentin 300 MG capsule  Commonly known as:  NEURONTIN  Take 600 mg by mouth 3 (three) times daily.     guaiFENesin 600 MG 12 hr tablet   Commonly known as:  MUCINEX  Take 1,200 mg by mouth 2 (two) times daily.     HYDROcodone-homatropine 5-1.5 MG/5ML syrup  Commonly known as:  HYCODAN  Take 5 mLs by mouth every 6 (six) hours as needed for cough.     levETIRAcetam 100 MG/ML solution  Commonly known as:  KEPPRA  Take 5 mLs (500 mg total) by mouth 2 (two) times daily.     lidocaine-prilocaine cream  Commonly known as:  EMLA  Apply 1 application topically as needed. Apply to [facility] A cath site 30-60 min before chemotherapy. Used every Wednesday     loratadine 10 MG tablet  Commonly known as:  CLARITIN  Take 10 mg by mouth daily.     magic mouthwash w/lidocaine Soln  Take 5 mLs by mouth 4 (four) times daily as needed.     omega-3 acid ethyl esters 1 G capsule  Commonly known as:  LOVAZA  Take 1 capsule (1 g total) by mouth daily.     polyethylene glycol powder powder  Commonly known as:  GLYCOLAX/MIRALAX  Take 17 g by mouth daily.     polyethylene glycol packet  Commonly known as:  MIRALAX / GLYCOLAX  Take 17 g by mouth daily.     sucralfate 1 G tablet  Commonly known as:  CARAFATE  Take 1 g by mouth 4 (four) times daily. Dissolve in 15 CC of water     triamcinolone cream 0.1 %  Commonly known as:  KENALOG  Apply 1 application topically 2 (two) times daily. Apply to nose and face for dry skin           Follow-up Information   Follow up with CONROY,NATHAN, PA-C In 2 weeks.   Contact information:   [facility] [address] [place] [address] [phone]        The results of significant diagnostics from this hospitalization (including imaging, microbiology, ancillary and laboratory) are listed below for reference.    Significant Diagnostic Studies: Ct Head Wo Contrast  01/22/2013  *RADIOLOGY REPORT*  Clinical Data: Fall, right frontal abrasion, right arm/leg weakness x2 weeks  CT HEAD WITHOUT CONTRAST  Technique:  Contiguous axial images were obtained from the base of the skull  through the vertex without contrast.  Comparison: MRI brain dated 06/22/2012  Findings: No evidence of parenchymal hemorrhage or extra-axial fluid collection.  Vasogenic edema in the subcortical left frontal lobe (series 2/image 21).  An underlying mass (possibly metastasis) is suspected.  Cerebral volume is age appropriate.  No ventriculomegaly.  The visualized  paranasal sinuses are essentially clear. The mastoid air cells are unopacified.  No evidence of calvarial fracture.  No CT evidence of acute infarction.  IMPRESSION: Vasogenic edema in the subcortical left frontal lobe.  An underlying mass (possibly metastasis) is suspected.  Consider MRI brain without / with contrast for further evaluation.   Original Report Authenticated By: Charline Bills, M.D.    Mr Laqueta Jean Wo Contrast  01/22/2013  *RADIOLOGY REPORT*  Clinical Data: 2-week history of right upper extremity weakness. Also right lower extremity weakness with gait disturbance.  History of metastatic non-small cell lung cancer diagnosed August 2013.  MRI HEAD WITHOUT AND WITH CONTRAST  Technique:  Multiplanar, multiecho pulse sequences of the brain and surrounding structures were obtained according to standard protocol without and with intravenous contrast  Contrast: 16mL MULTIHANCE GADOBENATE DIMEGLUMINE 529 MG/ML IV SOLN  Comparison: CT scan earlier today.  Prior MRI brain 06/22/2012.  Findings:  There is a 28 x 31 x 30 mm apparent solitary intra-axial lesion to the left posterior frontal cortex and subcortical white matter.  Moderate surrounding vasogenic edema noted.  The lesion itself shows moderate restricted diffusion.  No significant hemorrhage.  No midline shift.  No other visible intracranial lesions. In the setting of a known lung cancer with known metastatic disease, this lesion likely represents a metastasis. Primary brain tumor or abscess felt unlikely.  Slight age related atrophy.  Minimal periventricular T2 signal prolongation, likely  incidental chronic microvascular ischemic change.  No hydrocephalus.  No osseous lesions.  Mild pannus. Normal pituitary and cerebellar tonsils.  Major intracranial vascular structures patent.  Mild chronic sinus disease.  Negative orbits and mastoids.  IMPRESSION: Findings consistent with a 28 x 31 x 30 mm apparent solitary intra- axial metastasis to the left posterior frontal cortex and subcortical white matter.  Moderate surrounding vasogenic edema without midline shift.   Original Report Authenticated By: Davonna Belling, M.D.     Microbiology: Recent Results (from the past 240 hour(s))  MRSA PCR SCREENING     Status: Abnormal   Collection Time    01/22/13  9:43 PM      Result Value Range Status   MRSA by PCR POSITIVE (*) NEGATIVE Final   Comment:            The GeneXpert MRSA Assay (FDA     approved for NASAL specimens     only), is one component of a     comprehensive MRSA colonization     surveillance program. It is not     intended to diagnose MRSA     infection nor to guide or     monitor treatment for     MRSA infections.     RESULT CALLED TO, READ BACK BY AND VERIFIED WITH:     L SPENCER AT 0214 ON 03.18.2014 BY NBROOKS     Labs: Basic Metabolic Panel:  Recent Labs Lab 01/22/13 1240 01/27/13 0500 01/29/13 0540  NA 137 136  --   K 4.1 3.5  --   CL 101 99  --   CO2 25 26  --   GLUCOSE 98 124*  --   BUN 17 33*  --   CREATININE 1.04 0.92 1.03  CALCIUM 9.9 9.4  --    Liver Function Tests:  Recent Labs Lab 01/22/13 1240  AST 27  ALT 40  ALKPHOS 116  BILITOT 0.4  PROT 7.2  ALBUMIN 3.7   No results found for this basename: LIPASE, AMYLASE,  in  the last 168 hours No results found for this basename: AMMONIA,  in the last 168 hours CBC:  Recent Labs Lab 01/22/13 1240 01/27/13 0500  WBC 8.5 13.5*  NEUTROABS 6.0  --   HGB 13.8 12.7*  HCT 39.6 35.8*  MCV 93.6 90.4  PLT 179 200   Cardiac Enzymes: No results found for this basename: CKTOTAL, CKMB,  CKMBINDEX, TROPONINI,  in the last 168 hours BNP: BNP (last 3 results) No results found for this basename: PROBNP,  in the last 8760 hours CBG: No results found for this basename: GLUCAP,  in the last 168 hours  Time coordinating discharge: Over 30 minutes  Signed:  [name], MD  [facility]  01/29/2013, 10:57 AM  Pager #: [phone]

## 2013-01-29 NOTE — Progress Notes (Signed)
[facility] Radiation Oncology Dept Therapy Treatment Record Phone [phone]   Radiation Therapy was administered to Derrick Crosby on: 01/29/2013  11:17 AM and was treatment # 4 out of a planned course of 15 treatments. - treated at 9:45 am

## 2013-01-29 NOTE — Progress Notes (Signed)
CSW facilitated pt discharge needs to [facility].   CSW faxed pt discharge information via [facility], discussed with pt wife at bedside, provided RN phone number to call report, and pt family plans to provide transportation for pt to [facility] via private vehicle.   Pt wife expressed appreciation for CSW assistance.  No further CSW needs identified at this time.  CSW signing off.   Jacklynn Lewis, MSW, LCSWA  Clinical Social Work [phone]

## 2013-01-29 NOTE — Evaluation (Signed)
Clinical/Bedside Swallow Evaluation Patient Details  Name: Derrick Crosby MRN: [id number] Date of Birth: 1950/11/28  Today's Date: 01/29/2013 Time: 1027-     Past Medical History:  Past Medical History  Diagnosis Date  . Hypertension   . Hyperglyceridemia, pure   . COPD (chronic obstructive pulmonary disease)   . Hemoptysis   . Anxiety   . Depression   . Hemoptysis   . Seasonal allergies   . History of radiation therapy 07/05/12-07/26/12    lung ca   . Cancer     Skin  Cancer - Left ear.-  . Skin cancer     left ear  . Lung cancer 06/13/12    lung mass /right hilar suspicious for primary bronchogenic ca   Past Surgical History:  Past Surgical History  Procedure Laterality Date  . Tympanoplasty    . Video bronchoscopy  06/13/12    with endobronchial u/s/ with biospy left hilar mass/Dr. Lonia Farber  . Tonsillectomy      child   HPI:  62 y.o. left handed male with a PMH of metastatic non-small cell lung cancer diagnosed 06/2012, under the care of Dr. Arbutus Ped, status post systemic chemotherapy and XRT who was admitted 01/22/2013 with a two-week history of progressive weakness progressing to paralysis of his right upper arm and hand. 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 Radiation oncology has been consulted and the patient has been placed on Decadron. He also developed the acute onset of a tonic clonic seizure on 01/23/2013. Now on Keppra in addition to Decadron. SW assisting discharge planning.     Assessment / Plan / Recommendation Clinical Impression  Patient appears to have normal oropharyngeal swallow function, but an esophageal dysphagia is suspected (probable decreased motility and questionable stricture) as a side effect of radiation to the chest.  Patient and his wife report globus sensation (solid foods sticking), burning in esophageal area after swallows of  juice; belching, and even vomiting undigested food during a meal.  Wife states these symptoms began after radiation to the chest was completed.  Please consider GI w/u.    Aspiration Risk  Mild (May aspirate reflux)    Diet Recommendation Thin liquid;Dysphagia 2 (Fine chop)   Liquid Administration via: Cup;Straw Medication Administration: Crushed with puree (Or liquid meds) Supervision: Patient able to self feed;Full supervision/cueing for compensatory strategies Compensations: Slow rate;Small sips/bites;Multiple dry swallows after each bite/sip;Follow solids with liquid Postural Changes and/or Swallow Maneuvers: Out of bed for meals;Seated upright 90 degrees;Upright 30-60 min after meal    Other  Recommendations Recommended Consults: Consider GI evaluation;Consider esophageal assessment Oral Care Recommendations: Oral care QID Other Recommendations: Clarify dietary restrictions   Follow Up Recommendations       Frequency and Duration        Pertinent Vitals/Pain n/a    SLP Swallow Goals     Swallow Study Prior Functional Status       General HPI: 62 y.o. left handed male with a PMH of metastatic non-small cell lung cancer diagnosed 06/2012, under the care of Dr. Arbutus Ped, status post systemic chemotherapy and XRT who was admitted 01/22/2013 with a two-week history of progressive weakness progressing to paralysis of his right upper arm and hand. 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 Radiation oncology has been consulted and the patient has been placed on  Decadron. He also developed the acute onset of a tonic clonic seizure on 01/23/2013. Now on Keppra in addition to Decadron. SW assisting discharge planning.   Type of Study: Bedside swallow evaluation Diet Prior to this Study: Regular;Thin liquids Temperature Spikes Noted: No Respiratory Status: Room air History of Recent  Intubation: No Behavior/Cognition: Alert;Cooperative;Pleasant mood;Distractible;Requires cueing;Decreased sustained attention Oral Cavity - Dentition: Edentulous (3 teeth on lower right) Self-Feeding Abilities: Able to feed self Patient Positioning: Upright in chair Baseline Vocal Quality: Clear Volitional Cough: Weak Volitional Swallow: Able to elicit    Oral/Motor/Sensory Function Overall Oral Motor/Sensory Function: Appears within functional limits for tasks assessed   Ice Chips Ice chips: Within functional limits   Thin Liquid Thin Liquid: Within functional limits Presentation: Spoon;Cup;Straw    Nectar Thick Nectar Thick Liquid: Not tested   Honey Thick Honey Thick Liquid: Not tested   Puree Puree: Within functional limits Presentation: Self Fed;Spoon   Solid   GO    Solid: Within functional limits Presentation: Self Derrick Crosby, Derrick Crosby T 01/29/2013,12:39 PM

## 2013-01-29 NOTE — Progress Notes (Signed)
Physical Therapy Treatment Patient Details Name: Derrick Crosby MRN: [id number] DOB: 24-Feb-1951 Today's Date: 01/29/2013 Time: 8413-2440 PT Time Calculation (min): 25 min  PT Assessment / Plan / Recommendation Comments on Treatment Session  Spouse present. Pt pleasant and very willing to walk. Still demon R UE paresis mainly in wrist and fingers.  Requires cueing to functionally use R UE as he "forgets" where it is.  Pt tolerating well.    Follow Up Recommendations  SNF     Does the patient have the potential to tolerate intense rehabilitation     Barriers to Discharge        Equipment Recommendations  None recommended by PT    Recommendations for Other Services    Frequency Min 3X/week   Plan Discharge plan remains appropriate;Frequency remains appropriate    Precautions / Restrictions Precautions Precautions: Fall Precaution Comments: impulsive Restrictions Weight Bearing Restrictions: No   Pertinent Vitals/Pain No c/o pain    Mobility  Bed Mobility Bed Mobility: Supine to Sit;Sitting - Scoot to Edge of Bed Supine to Sit: 3: Mod assist Details for Bed Mobility Assistance: assist with upper body as pt was unable to functionally use R UE to push self up Transfers Transfers: Sit to Stand;Stand to Sit Sit to Stand: 4: Min assist;From bed Stand to Sit: 4: Min assist Details for Transfer Assistance: 75% VC's esp to use R UE(neglect).  Pt a bit impulsive and requires cueing on safety and not to forget where R hand is (pt left his r hand grasping Evw Walker handle when he quickly sat) Ambulation/Gait Ambulation/Gait Assistance: 1: +2 Total assist Ambulation/Gait: Patient Percentage: 60% Ambulation Distance (Feet): 170 Feet (65 feet x 2 with one sitting rest break) Assistive device: Eva walker Ambulation/Gait Assistance Details: 75% VC's on proper use of EVA walker and upright posture.  Pt required some assist with direction. Gait Pattern: Step-through pattern;Decreased  stance time - right;Decreased weight shift to right;Decreased dorsiflexion - right;Right circumduction;Right genu recurvatum;Decreased trunk rotation Gait velocity: decreased General Gait Details: +2 required for safety, balance     PT Goals                                       progressing    Visit Information  Last PT Received On: 01/29/13 Assistance Needed: +2    Subjective Data      Cognition       Balance   poor  End of Session PT - End of Session Equipment Utilized During Treatment: Gait belt Activity Tolerance: Patient tolerated treatment well Patient left: in chair;with call bell/phone within reach;with family/visitor present Nurse Communication: Mobility status   Felecia Shelling  PTA [facility]  Acute  Rehab Pager      [phone]

## 2013-01-29 NOTE — Progress Notes (Signed)
Report called to [facility] in [place] [employer].  Philomena Doheny RN

## 2013-01-30 ENCOUNTER — Ambulatory Visit
Admit: 2013-01-30 | Discharge: 2013-01-30 | Disposition: A | Discharge status: Home / Self Care | Payer: BC Managed Care – PPO | Attending: Radiation Oncology | Admitting: Radiation Oncology

## 2013-01-30 ENCOUNTER — Ambulatory Visit
Admission: RE | Admit: 2013-01-30 | Discharge: 2013-01-30 | Disposition: A | Discharge status: Home / Self Care | Payer: BC Managed Care – PPO | Source: Ambulatory Visit | Attending: Radiation Oncology | Admitting: Radiation Oncology

## 2013-01-30 VITALS — BP 143/89 | HR 65 | Temp 98.8°F | Resp 18 | Wt 163.0 lb

## 2013-01-30 DIAGNOSIS — C7931 Secondary malignant neoplasm of brain: Secondary | ICD-10-CM

## 2013-01-30 NOTE — Progress Notes (Signed)
Patient here for weekly assessment of radiation treatment to whole brain.Completed 5 of 15 treatments.Denies pain.Reviewed side effects with patient and family related to headache and nausea.Patient discharged on 01/29/13 to [facility] in [place].On doxycycline for mrsa of back.Also reviewed hair and scalp care, to use mild shampoo and that we will give radiaplex on next week.

## 2013-01-30 NOTE — Progress Notes (Signed)
[facility]    Radiation Oncology [address] [place]     Maryln Gottron, M.D. [place], [place] [address]               Billie Lade, M.D., Ph.D. Phone: [phone]      Molli Hazard A. Kathrynn Running, M.D. Fax: [phone]      [name], M.D., Ph.D.         Lurline Hare, M.D.         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  Current Dose: 12.5 Gy  Current Fraction: 5  Planned Dose: 37.5 Gy  Narrative: Derrick Crosby was seen today for weekly treatment management. The chart was checked and port films  were reviewed. He is tolerating his treatments well at this time. The patient was discharged from the hospital after his fourth treatment. [facility] in [facility] [place].  Review of patient's allergies indicates no known allergies.  Current Outpatient Prescriptions  Medication Sig Dispense Refill  . acetaminophen (TYLENOL) 325 MG tablet Take 650 mg by mouth every 6 (six) hours as needed. For general aches      . albuterol (PROVENTIL HFA;VENTOLIN HFA) 108 (90 BASE) MCG/ACT inhaler Inhale 2 puffs into the lungs every 6 (six) hours as needed for wheezing.       Marland Kitchen alum & mag hydroxide-simeth (MAALOX/MYLANTA) 200-200-20 MG/5ML suspension Take 30 mLs by mouth every 6 (six) hours as needed.  355 mL  0  . Alum & Mag Hydroxide-Simeth (MAGIC MOUTHWASH W/LIDOCAINE) SOLN Take 5 mLs by mouth 4 (four) times daily as needed.  25 mL  0  . amitriptyline (ELAVIL) 25 MG tablet Take 1 tablet (25 mg total) by mouth at bedtime.  30 tablet  2  . aspirin EC 81 MG tablet Take 81 mg by mouth daily.      . chlorproMAZINE (THORAZINE) 10 MG tablet Take 1 tablet (10 mg total) by mouth 4  (four) times daily as needed.  90 tablet  0  . citalopram (CELEXA) 20 MG tablet Take 1 tablet (20 mg total) by mouth every evening.  30 tablet  0  . clonazePAM (KLONOPIN) 0.5 MG tablet Take 1 tablet (0.5 mg total) by mouth 2 (two) times daily as needed for anxiety. For anxiety  30 tablet  0  . dexamethasone (DECADRON) 4 MG tablet Take 1 tablet (4 mg total) by mouth every 6 (six) hours.  120 tablet  0  . fish oil-omega-3 fatty acids 1000 MG capsule Take 1 g by mouth daily.       . fluconazole (DIFLUCAN) 10 MG/ML suspension Take 10 mLs (100 mg total) by mouth daily.  35 mL  0  . gabapentin (NEURONTIN) 300 MG capsule Take 600 mg by mouth 3 (three) times daily.      Marland Kitchen guaiFENesin (MUCINEX) 600 MG 12 hr tablet Take 1,200 mg by mouth 2 (two) times daily.      Marland Kitchen HYDROcodone-homatropine (HYCODAN) 5-1.5 MG/5ML syrup Take 5 mLs by mouth every 6 (six) hours as needed for cough.  240 mL  0  . levETIRAcetam (KEPPRA) 100 MG/ML  solution Take 5 mLs (500 mg total) by mouth 2 (two) times daily.  473 mL  0  . lidocaine-prilocaine (EMLA) cream Apply 1 application topically as needed. Apply to [facility] A cath site 30-60 min before chemotherapy. Used every Wednesday      . loratadine (CLARITIN) 10 MG tablet Take 10 mg by mouth daily.      Marland Kitchen omega-3 acid ethyl esters (LOVAZA) 1 G capsule Take 1 capsule (1 g total) by mouth daily.  30 capsule  0  . polyethylene glycol (MIRALAX / GLYCOLAX) packet Take 17 g by mouth daily.  14 each  0  . polyethylene glycol powder (GLYCOLAX/MIRALAX) powder Take 17 g by mouth daily.  255 g  0  . sucralfate (CARAFATE) 1 G tablet Take 1 g by mouth 4 (four) times daily. Dissolve in 15 CC of water      . triamcinolone cream (KENALOG) 0.1 % Apply 1 application topically 2 (two) times daily. Apply to nose and face for dry skin       No current facility-administered medications for this encounter.   Labs:  Lab Results  Component Value Date   WBC 13.5* 01/27/2013   HGB 12.7* 01/27/2013   HCT 35.8*  01/27/2013   MCV 90.4 01/27/2013   PLT 200 01/27/2013   Lab Results  Component Value Date   CREATININE 1.03 01/29/2013   BUN 33* 01/27/2013   NA 136 01/27/2013   K 3.5 01/27/2013   CL 99 01/27/2013   CO2 26 01/27/2013   Lab Results  Component Value Date   ALT 40 01/22/2013   AST 27 01/22/2013   BILITOT 0.4 01/22/2013    Physical Examination:  weight is 163 lb (73.936 kg). His temperature is 98.8 F (37.1 C). His blood pressure is 143/89 and his pulse is 65. His respiration is 18 and oxygen saturation is 97%.    Wt Readings from Last 3 Encounters:  01/30/13 163 lb (73.936 kg)  01/22/13 168 lb (76.204 kg)  11/29/12 171 lb (77.565 kg)    The oral cavity is free of any secondary infection. Lungs - Normal respiratory effort, chest expands symmetrically. Lungs are clear to auscultation, no crackles or wheezes.  Heart has regular rhythm and rate  Abdomen is soft and non tender with normal bowel sounds  Assessment:  Patient tolerating treatments well  Plan: Continue treatment per original radiation prescription

## 2013-01-31 ENCOUNTER — Ambulatory Visit
Admit: 2013-01-31 | Discharge: 2013-01-31 | Disposition: A | Discharge status: Home / Self Care | Payer: BC Managed Care – PPO | Attending: Radiation Oncology | Admitting: Radiation Oncology

## 2013-02-01 ENCOUNTER — Ambulatory Visit
Admit: 2013-02-01 | Discharge: 2013-02-01 | Disposition: A | Discharge status: Home / Self Care | Payer: BC Managed Care – PPO | Attending: Radiation Oncology | Admitting: Radiation Oncology

## 2013-02-02 ENCOUNTER — Ambulatory Visit
Admit: 2013-02-02 | Discharge: 2013-02-02 | Disposition: A | Discharge status: Home / Self Care | Payer: BC Managed Care – PPO | Attending: Radiation Oncology | Admitting: Radiation Oncology

## 2013-02-05 ENCOUNTER — Ambulatory Visit
Admission: RE | Admit: 2013-02-05 | Discharge: 2013-02-05 | Disposition: A | Discharge status: Home / Self Care | Payer: BC Managed Care – PPO | Source: Ambulatory Visit | Attending: Radiation Oncology | Admitting: Radiation Oncology

## 2013-02-06 ENCOUNTER — Ambulatory Visit
Admission: RE | Admit: 2013-02-06 | Discharge: 2013-02-06 | Disposition: A | Discharge status: Home / Self Care | Payer: BC Managed Care – PPO | Source: Ambulatory Visit | Attending: Radiation Oncology | Admitting: Radiation Oncology

## 2013-02-06 ENCOUNTER — Encounter: Payer: Self-pay | Admitting: Radiation Oncology

## 2013-02-06 VITALS — BP 134/79 | HR 62 | Temp 98.1°F | Resp 20 | Wt 158.4 lb

## 2013-02-06 DIAGNOSIS — C7931 Secondary malignant neoplasm of brain: Secondary | ICD-10-CM

## 2013-02-06 HISTORY — DX: Secondary malignant neoplasm of brain: C79.31

## 2013-02-06 MED ORDER — RADIAPLEXRX EX GEL
Freq: Once | CUTANEOUS | Status: AC
  Administered 2013-02-06: 16:00:00 via TOPICAL

## 2013-02-06 NOTE — Progress Notes (Signed)
Pt denies pain but c/o "heartburn". Pt continues to reside at nursing rehab facility in [place]. Pt denies HA, nausea, loss of appetite. Gave pt Radiaplex to apply to head, neck area. His skin is very dry and has pinkness on top of scalp.

## 2013-02-06 NOTE — Progress Notes (Signed)
Weekly Management Note Current Dose: 25  Gy  Projected Dose: 37.5 Gy   Narrative:  The patient presents for routine under treatment assessment.  CBCT/MVCT images/Port film x-rays were reviewed.  The chart was checked. No headaches. Working with PT. Reflux.  Did not bring med list from nursing home.   Physical Findings: Weight: 158 lb 6.4 oz (71.85 kg). Appears more alert and less weakness. No thrush.  Impression:  The patient is tolerating radiation.  Plan:  Continue treatment as planned. Will contact nursing home to ensure he is on a PPI (prilosec, nexium, etc) and also that he is on a decadron taper.

## 2013-02-07 ENCOUNTER — Telehealth: Payer: Self-pay | Admitting: *Deleted

## 2013-02-07 ENCOUNTER — Ambulatory Visit
Admission: RE | Admit: 2013-02-07 | Discharge: 2013-02-07 | Disposition: A | Discharge status: Home / Self Care | Payer: BC Managed Care – PPO | Source: Ambulatory Visit | Attending: Radiation Oncology | Admitting: Radiation Oncology

## 2013-02-07 NOTE — Telephone Encounter (Signed)
Per Dr Michell Heinrich, spoke w/pt's nurse at facility where he is residing. Melanie, LPN states pt is not on PPI and is taking Decadron 4 mg q 6 hrs. Informed her that Dr Michell Heinrich wants pt to be on PPI. She states she will discuss prescribing PPI w/Dr Yetta Flock. Also informed her Dr Michell Heinrich will order Decadron taper, will call her back w/taper schedule. 3:00 pm Per Dr Michell Heinrich, called Shawna Orleans, LPN w/Decadron taper of decreasing dose by 2 mg daily for 5 days each taper.  Decadron q 6 hrs, 1st 3 doses = 4 mg each, last dose = 2 mg x 5 days. Decadron 4 mg q 8 hrs x 5 days. Decadron q 8 hr, 1st 2 doses = 4 mg each, last dose = 2 mg x 5 days. Decadron 4 mg q 12 hrs x 5 days. Decadron q 12 hrs, 1st dose = 4 mg second dose = 2 mg x 5 days. Decadron 4 mg daily x 5 days. Decadron 2 mg daily x 5 days then stop. Melanie, LPN stated she would give this taper to dr for his review and approval.

## 2013-02-08 ENCOUNTER — Ambulatory Visit
Admission: RE | Admit: 2013-02-08 | Discharge: 2013-02-08 | Disposition: A | Discharge status: Home / Self Care | Payer: BC Managed Care – PPO | Source: Ambulatory Visit | Attending: Radiation Oncology | Admitting: Radiation Oncology

## 2013-02-09 ENCOUNTER — Ambulatory Visit
Admission: RE | Admit: 2013-02-09 | Discharge: 2013-02-09 | Disposition: A | Discharge status: Home / Self Care | Payer: BC Managed Care – PPO | Source: Ambulatory Visit | Attending: Radiation Oncology | Admitting: Radiation Oncology

## 2013-02-12 ENCOUNTER — Ambulatory Visit
Admission: RE | Admit: 2013-02-12 | Discharge: 2013-02-12 | Disposition: A | Discharge status: Home / Self Care | Payer: BC Managed Care – PPO | Source: Ambulatory Visit | Attending: Radiation Oncology | Admitting: Radiation Oncology

## 2013-02-12 ENCOUNTER — Ambulatory Visit
Admit: 2013-02-12 | Discharge: 2013-02-12 | Disposition: A | Discharge status: Home / Self Care | Payer: BC Managed Care – PPO | Attending: Radiation Oncology | Admitting: Radiation Oncology

## 2013-02-12 ENCOUNTER — Encounter: Payer: Self-pay | Admitting: Radiation Oncology

## 2013-02-12 VITALS — BP 123/82 | HR 91 | Temp 97.7°F | Resp 18 | Wt 158.0 lb

## 2013-02-12 DIAGNOSIS — C7931 Secondary malignant neoplasm of brain: Secondary | ICD-10-CM

## 2013-02-12 NOTE — Progress Notes (Addendum)
Patient presents to the clinic today accompanied by his wife for PUT with Dr. Roselind Messier. Patient requesting to see a physician about pain in the back of his head and behind his ears. Patient is alert and oriented to person, place, and time. No distress noted. Patient reports that he has been walking at the nursing home with help of physical therapy. Pleasant affect noted. Patient reports that only when he touch the back of his head and behind his ears he feels intense pain. Patient taking decadron but, unsure of the dose and how often "because the home doesn't tell him." Patient reports taking keppra liquid daily to prevent seizures and wishes to keep medication in liquid form. Patient denies seizure activity. Only faint hyperpigmentation of occipital scalp and behind ears noted without desquamation despite using radiaplex. No thrush noted. Patient reports having reflux despite using prilosec. Patient's wife reports that he has had difficulty swallowing in the past and the home is pureeing his meats. Patient hopes to be discharged home from nursing facility on Wednesday. Reported all findings to Dr. Roselind Messier.

## 2013-02-12 NOTE — Progress Notes (Signed)
[facility]    Radiation Oncology [address] [place]     Maryln Gottron, M.D. [place], [place] [address]               Billie Lade, M.D., Ph.D. Phone: [phone]      Molli Hazard A. Kathrynn Running, M.D. Fax: [phone]      [name], M.D., Ph.D.         Lurline Hare, M.D.         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  Current Dose: 35 Gy  Current Fraction: 14  Planned Dose: 37.5 Gy  Narrative: Derrick Crosby was seen today for weekly treatment management. The chart was checked and port films  were reviewed. The patient asked to be seen today for complaints of itching and discomfort behind the ears. He denies any headaches or nausea.  Review of patient's allergies indicates no known allergies.  Current Outpatient Prescriptions  Medication Sig Dispense Refill  . acetaminophen (TYLENOL) 325 MG tablet Take 650 mg by mouth every 6 (six) hours as needed. For general aches      . albuterol (PROVENTIL HFA;VENTOLIN HFA) 108 (90 BASE) MCG/ACT inhaler Inhale 2 puffs into the lungs every 6 (six) hours as needed for wheezing.       Marland Kitchen alum & mag hydroxide-simeth (MAALOX/MYLANTA) 200-200-20 MG/5ML suspension Take 30 mLs by mouth every 6 (six) hours as needed.  355 mL  0  . Alum & Mag Hydroxide-Simeth (MAGIC MOUTHWASH W/LIDOCAINE) SOLN Take 5 mLs by mouth 4 (four) times daily as needed.  25 mL  0  . amitriptyline (ELAVIL) 25 MG tablet Take 1 tablet (25 mg total) by mouth at bedtime.  30 tablet  2  . aspirin EC 81 MG tablet Take 81 mg by mouth daily.      . chlorproMAZINE (THORAZINE) 10 MG tablet Take 1 tablet (10 mg total) by mouth 4 (four) times daily as needed.  90 tablet  0  . citalopram (CELEXA) 20 MG  tablet Take 1 tablet (20 mg total) by mouth every evening.  30 tablet  0  . clonazePAM (KLONOPIN) 0.5 MG tablet Take 1 tablet (0.5 mg total) by mouth 2 (two) times daily as needed for anxiety. For anxiety  30 tablet  0  . dexamethasone (DECADRON) 4 MG tablet Take 1 tablet (4 mg total) by mouth every 6 (six) hours.  120 tablet  0  . fish oil-omega-3 fatty acids 1000 MG capsule Take 1 g by mouth daily.       . fluconazole (DIFLUCAN) 10 MG/ML suspension Take 10 mLs (100 mg total) by mouth daily.  35 mL  0  . gabapentin (NEURONTIN) 300 MG capsule Take 600 mg by mouth 3 (three) times daily.      Marland Kitchen guaiFENesin (MUCINEX) 600 MG 12 hr tablet Take 1,200 mg by mouth 2 (two) times daily.      Marland Kitchen HYDROcodone-homatropine (HYCODAN) 5-1.5 MG/5ML syrup Take 5 mLs by mouth every 6 (six) hours as needed for cough.  240 mL  0  . levETIRAcetam (KEPPRA) 100 MG/ML solution Take 5 mLs (500 mg total) by  mouth 2 (two) times daily.  473 mL  0  . lidocaine-prilocaine (EMLA) cream Apply 1 application topically as needed. Apply to [facility] A cath site 30-60 min before chemotherapy. Used every Wednesday      . loratadine (CLARITIN) 10 MG tablet Take 10 mg by mouth daily.      Marland Kitchen omega-3 acid ethyl esters (LOVAZA) 1 G capsule Take 1 capsule (1 g total) by mouth daily.  30 capsule  0  . polyethylene glycol (MIRALAX / GLYCOLAX) packet Take 17 g by mouth daily.  14 each  0  . polyethylene glycol powder (GLYCOLAX/MIRALAX) powder Take 17 g by mouth daily.  255 g  0  . sucralfate (CARAFATE) 1 G tablet Take 1 g by mouth 4 (four) times daily. Dissolve in 15 CC of water      . triamcinolone cream (KENALOG) 0.1 % Apply 1 application topically 2 (two) times daily. Apply to nose and face for dry skin       No current facility-administered medications for this encounter.   Labs:  Lab Results  Component Value Date   WBC 13.5* 01/27/2013   HGB 12.7* 01/27/2013   HCT 35.8* 01/27/2013   MCV 90.4 01/27/2013   PLT 200 01/27/2013   Lab Results   Component Value Date   CREATININE 1.03 01/29/2013   BUN 33* 01/27/2013   NA 136 01/27/2013   K 3.5 01/27/2013   CL 99 01/27/2013   CO2 26 01/27/2013   Lab Results  Component Value Date   ALT 40 01/22/2013   AST 27 01/22/2013   BILITOT 0.4 01/22/2013    Physical Examination:  weight is 158 lb (71.668 kg). His oral temperature is 97.7 F (36.5 C). His blood pressure is 123/82 and his pulse is 91. His respiration is 18 and oxygen saturation is 98%.    Wt Readings from Last 3 Encounters:  02/12/13 158 lb (71.668 kg)  02/06/13 158 lb 6.4 oz (71.85 kg)  01/30/13 163 lb (73.936 kg)    The oral cavity is free of secondary infection.  The scalp area shows some erythema. There is particular erythema where the patient's eyeglasses fit behind his ears. No skin breakdown is appreciated.    Assessment:  Patient tolerating treatments well except for issues as above.  Plan: Continue treatment per original radiation prescription. Patient will be given nonadherent dressings place underneath his eyeglasses.  He will also be given either radio Plex Biafine or Aquaphor.

## 2013-02-13 ENCOUNTER — Telehealth: Payer: Self-pay | Admitting: *Deleted

## 2013-02-13 ENCOUNTER — Encounter: Payer: Self-pay | Admitting: Radiation Oncology

## 2013-02-13 ENCOUNTER — Ambulatory Visit
Admission: RE | Admit: 2013-02-13 | Discharge: 2013-02-13 | Disposition: A | Discharge status: Home / Self Care | Payer: BC Managed Care – PPO | Source: Ambulatory Visit | Attending: Radiation Oncology | Admitting: Radiation Oncology

## 2013-02-13 ENCOUNTER — Ambulatory Visit
Admit: 2013-02-13 | Discharge: 2013-02-13 | Disposition: A | Discharge status: Home / Self Care | Payer: BC Managed Care – PPO | Attending: Radiation Oncology | Admitting: Radiation Oncology

## 2013-02-13 VITALS — BP 117/87 | HR 80 | Temp 98.5°F | Ht 69.0 in | Wt 157.1 lb

## 2013-02-13 DIAGNOSIS — C7931 Secondary malignant neoplasm of brain: Secondary | ICD-10-CM

## 2013-02-13 NOTE — Progress Notes (Signed)
Derrick Crosby is here for his final treatment visit accompanied by his wife.  He has had 15/15 treatments to his left lung and brain.  He denies pain except when he swallows.  His wife states that it has been getting worse.  He is taking Carafate but does not like it.  He also has redness and blisters on the backs of both ears.  He has been using aquaphor.  He denies dizziness, blurred vision and nausea.  He does have fatigue.

## 2013-02-13 NOTE — Telephone Encounter (Signed)
Per Dr Michell Heinrich, called [facility] facility [phone] to verify pt's Decadron taper. Spoke w/Allisha RN who stated pt's current dose of Decadron 4 mg tid through 02/17/13. She states when pt is d/c home he will be given copy of MAR w/instructions per Shawna Orleans, LPN. She states the [facility] is very clear. Requested she have nurse review taper w/pt's wife prior to his d/c. She stated that she would report this to [facility], LPN on day shift tomorrow.  Will FU w/pt's wife after he is d/c home to verify she understands pt's Decadron taper.

## 2013-02-13 NOTE — Progress Notes (Signed)
Weekly Management Note Current Dose:37.5 Gy  Projected Dose:37.5 Gy   Narrative:  The patient presents for routine under treatment assessment.  CBCT/MVCT images/Port film x-rays were reviewed.  The chart was checked. Moist desquamation behind ears and eyes are irritated. D/C from SNF tomorrow. Denies headaches. Feels stronger.  Physical Findings:  Moist desquamation behind ears. No thrush.   Vitals:  Filed Vitals:   02/13/13 1502  BP: 117/87  Pulse: 80  Temp: 98.5 F (36.9 C)   Weight:  Wt Readings from Last 3 Encounters:  02/13/13 157 lb 1.6 oz (71.26 kg)  02/12/13 158 lb (71.668 kg)  02/06/13 158 lb 6.4 oz (71.85 kg)   Lab Results  Component Value Date   WBC 13.5* 01/27/2013   HGB 12.7* 01/27/2013   HCT 35.8* 01/27/2013   MCV 90.4 01/27/2013   PLT 200 01/27/2013   Lab Results  Component Value Date   CREATININE 1.03 01/29/2013   BUN 33* 01/27/2013   NA 136 01/27/2013   K 3.5 01/27/2013   CL 99 01/27/2013   CO2 26 01/27/2013     Impression:  Finished RT today.   Plan:  Verified decadron taper with wife and SNF. F/u in 1 month. Has CT and med onc f/u end of April

## 2013-02-16 NOTE — Progress Notes (Signed)
  Radiation Oncology         [phone] ________________________________  Name: Derrick Crosby MRN: [id number]  Date: 02/13/2013  DOB: 1951/07/18  End of Treatment Note  Diagnosis:   Metastatic lung cancer to brain   Indication for treatment:  Palliative       Radiation treatment dates:   01/24/2013-02/13/2013  Site/dose:   Whole brain back/37.5 gray at 2.5 gray per fraction x15 fractions  Beams/energy:   Opposed laterals with 6 MV photons  Narrative: The patient tolerated radiation treatment relatively well.   He had moist desquamation behind his ears. He was able to be placed on a Decadron taper. His strength and coordination significantly improved while he was at a rehabilitation facility.  Plan: The patient has completed radiation treatment. The patient will return to radiation oncology clinic for routine followup in one month. I advised them to call or return sooner if they have any questions or concerns related to their recovery or treatment.  ------------------------------------------------  Lurline Hare, MD

## 2013-02-19 ENCOUNTER — Telehealth: Payer: Self-pay | Admitting: *Deleted

## 2013-02-19 NOTE — Telephone Encounter (Signed)
Called pt's wife to check on his status after being d/c to home from [employer] last week. She states she needs refill on pt's Sucralfate 1mg  tabs, Clonazepam 0.5 mg, Hydrocodone-Homatropine S cough syrup, Mucinex. Advised her she can buy Mucinex or it's generic equivalent at drug store.  She states she is low on several other meds. Reviewed meds w/wife and had her read labels to identify meds that have refills left on script. Advised her to call pharmacy and request those refills. She states pt "does not complain of pain much, hasn't had any seizures but has awful cough". She states his cough is nonproductive, and she feels "some of it is from his COPD". She states pt continues to have painful swallowing, difficulty w/meats. Advised she cut meats very small, add gravy, have pt take small bites. Informed wife dr is out of office until tomorrow; will route her requests to dr today and call her back by tomorrow afternoon.  Ms Coste verbalized agreement, understanding of above.

## 2013-02-20 ENCOUNTER — Telehealth: Payer: Self-pay | Admitting: *Deleted

## 2013-02-20 NOTE — Telephone Encounter (Signed)
Spoke w/pt's wife and informed her that Dr Michell Heinrich states pt needs to contact his PCP for refills on meds that wife requested yesterday. Wife verbalized understanding.

## 2013-02-20 NOTE — Telephone Encounter (Signed)
Call PCP for refills. I only prescribed decadron. Not sure where the painful swallowing is coming from.  If it is sore in the back of his throat, it could be thrush.  If it is sore in his esophagus, that is another issue. Happy to see him in clinic tomorrow or next week if they feel it is appropriate.

## 2013-02-21 ENCOUNTER — Telehealth: Payer: Self-pay | Admitting: *Deleted

## 2013-02-21 NOTE — Telephone Encounter (Signed)
Per Dr Michell Heinrich, called pt's wife and inquired exactly where pt's pain is when he swallows. She states it is in his mid chest, not at his throat/neck. She states Dr Arbutus Ped told them that pt "may have acid reflux, and his tumor is pressing on his esophagus". She states pt takes Omeprazole, Maalox prn, Sucralfate. She states Sucralfate is "very helpful". She states she went to pt's PCP yesterday, the dr was not in but she spoke w/nurse re: med refills. She states she "is sure nurse will take care of it". She states pt is not out of meds at this time; she just doesn't want him to run out. We discussed thrush signs/symptoms. She looked into pt's mouth for signs of thrush, and stated "it looks good, there are no white patches and his tongue is not white".  She also states he "gargles w/salt water daily". Informed pt Dr Michell Heinrich will see him today or next week if they feel this is appropriate. She states she doesn't feel it is necessary at this time; she drives pt, and they live out of town. She states she has to bring pt to [facility] for ct chest 4/23. Reinforced that she should call this office for any further problems, questions; gave her contact name/number for office. Ms Herrman verbalized agreement, understanding of above.

## 2013-02-23 ENCOUNTER — Telehealth: Payer: Self-pay | Admitting: *Deleted

## 2013-02-23 NOTE — Telephone Encounter (Signed)
Rec'd call from wife, she states that patient is sleeping a lot and when he wakes up all he does is states that "somethings wrong".  Spoke with wife approx on phone, denies any fever, chills, nausea, vomiting, uncontrolled movements or seizure activity. She states she asks the patient what is wrong, and all he says is he doesn't know. He has mutiple meds that can be causing sedation.  He did complain of a headache yesterday and she gave him Tylenol. He is currently weaning off of Dexamethasone for recently treated brain mets. Instructed patient wife that it is hard to assess and help with current situation if it is not apparent. Informed her if she is uncomfortable with patient, she should take him to PCP or ER for further evaluation. If patient headache continues, or becomes more severe, contact Dr Michell Heinrich for further instructions regarding post-Radiation issues and Decadron tapering. Wife verbalized understanding and will continue current plan with scans and follow up with Dr Arbutus Ped.

## 2013-02-23 NOTE — Telephone Encounter (Signed)
Received call from pt's wife stating pt "is sleeping all the time, and all he wants to eat is sweets." Wife asking if this "will make him a diabetic". Advised her that pt eating sweets will not cause diabetes and for her to encourage him to eat other foods. She states she does. She states pt had HA this morning; she gave him Tylenol w/good relief. She states he is not nauseated, has not verbalized any other c/o today. She states she is tapering his Decadron. Reminded wife of her conversation w/med onc RN earlier today. Wife states she did not take pt to PCP or ED but understands that she should if she feels pt's condition has worsened.  Will call Mon to check on pt.

## 2013-02-24 ENCOUNTER — Emergency Department (HOSPITAL_COMMUNITY): Payer: BC Managed Care – PPO

## 2013-02-24 ENCOUNTER — Inpatient Hospital Stay (HOSPITAL_COMMUNITY)
Admission: EM | Admit: 2013-02-24 | Discharge: 2013-03-02 | DRG: 541 | Disposition: A | Discharge status: Home Health | Payer: BC Managed Care – PPO | Attending: Internal Medicine | Admitting: Internal Medicine

## 2013-02-24 ENCOUNTER — Encounter (HOSPITAL_COMMUNITY): Payer: Self-pay

## 2013-02-24 DIAGNOSIS — D72819 Decreased white blood cell count, unspecified: Secondary | ICD-10-CM | POA: Diagnosis present

## 2013-02-24 DIAGNOSIS — T66XXXS Radiation sickness, unspecified, sequela: Secondary | ICD-10-CM

## 2013-02-24 DIAGNOSIS — J449 Chronic obstructive pulmonary disease, unspecified: Secondary | ICD-10-CM | POA: Diagnosis present

## 2013-02-24 DIAGNOSIS — K8020 Calculus of gallbladder without cholecystitis without obstruction: Secondary | ICD-10-CM | POA: Diagnosis present

## 2013-02-24 DIAGNOSIS — R1310 Dysphagia, unspecified: Secondary | ICD-10-CM

## 2013-02-24 DIAGNOSIS — Y842 Radiological procedure and radiotherapy as the cause of abnormal reaction of the patient, or of later complication, without mention of misadventure at the time of the procedure: Secondary | ICD-10-CM | POA: Diagnosis present

## 2013-02-24 DIAGNOSIS — F3289 Other specified depressive episodes: Secondary | ICD-10-CM | POA: Diagnosis present

## 2013-02-24 DIAGNOSIS — D6959 Other secondary thrombocytopenia: Secondary | ICD-10-CM | POA: Diagnosis present

## 2013-02-24 DIAGNOSIS — E46 Unspecified protein-calorie malnutrition: Secondary | ICD-10-CM | POA: Diagnosis present

## 2013-02-24 DIAGNOSIS — J4489 Other specified chronic obstructive pulmonary disease: Secondary | ICD-10-CM | POA: Diagnosis present

## 2013-02-24 DIAGNOSIS — J690 Pneumonitis due to inhalation of food and vomit: Secondary | ICD-10-CM | POA: Diagnosis present

## 2013-02-24 DIAGNOSIS — G8190 Hemiplegia, unspecified affecting unspecified side: Secondary | ICD-10-CM | POA: Diagnosis present

## 2013-02-24 DIAGNOSIS — C7931 Secondary malignant neoplasm of brain: Secondary | ICD-10-CM | POA: Diagnosis present

## 2013-02-24 DIAGNOSIS — I10 Essential (primary) hypertension: Secondary | ICD-10-CM | POA: Diagnosis present

## 2013-02-24 DIAGNOSIS — R569 Unspecified convulsions: Secondary | ICD-10-CM | POA: Diagnosis present

## 2013-02-24 DIAGNOSIS — D696 Thrombocytopenia, unspecified: Secondary | ICD-10-CM | POA: Diagnosis present

## 2013-02-24 DIAGNOSIS — C3400 Malignant neoplasm of unspecified main bronchus: Secondary | ICD-10-CM | POA: Diagnosis present

## 2013-02-24 DIAGNOSIS — J189 Pneumonia, unspecified organism: Principal | ICD-10-CM | POA: Diagnosis present

## 2013-02-24 DIAGNOSIS — Z9221 Personal history of antineoplastic chemotherapy: Secondary | ICD-10-CM

## 2013-02-24 DIAGNOSIS — Z87891 Personal history of nicotine dependence: Secondary | ICD-10-CM

## 2013-02-24 DIAGNOSIS — F411 Generalized anxiety disorder: Secondary | ICD-10-CM | POA: Diagnosis present

## 2013-02-24 DIAGNOSIS — G9340 Encephalopathy, unspecified: Secondary | ICD-10-CM | POA: Diagnosis present

## 2013-02-24 DIAGNOSIS — G936 Cerebral edema: Secondary | ICD-10-CM | POA: Diagnosis present

## 2013-02-24 DIAGNOSIS — B3781 Candidal esophagitis: Secondary | ICD-10-CM | POA: Diagnosis present

## 2013-02-24 DIAGNOSIS — Z923 Personal history of irradiation: Secondary | ICD-10-CM

## 2013-02-24 DIAGNOSIS — Z79899 Other long term (current) drug therapy: Secondary | ICD-10-CM

## 2013-02-24 DIAGNOSIS — F419 Anxiety disorder, unspecified: Secondary | ICD-10-CM | POA: Diagnosis present

## 2013-02-24 DIAGNOSIS — K208 Other esophagitis without bleeding: Secondary | ICD-10-CM | POA: Diagnosis present

## 2013-02-24 DIAGNOSIS — K7689 Other specified diseases of liver: Secondary | ICD-10-CM | POA: Diagnosis present

## 2013-02-24 DIAGNOSIS — Z681 Body mass index (BMI) 19 or less, adult: Secondary | ICD-10-CM

## 2013-02-24 DIAGNOSIS — D709 Neutropenia, unspecified: Secondary | ICD-10-CM | POA: Diagnosis present

## 2013-02-24 DIAGNOSIS — F329 Major depressive disorder, single episode, unspecified: Secondary | ICD-10-CM | POA: Diagnosis present

## 2013-02-24 DIAGNOSIS — E871 Hypo-osmolality and hyponatremia: Secondary | ICD-10-CM | POA: Diagnosis present

## 2013-02-24 DIAGNOSIS — C3490 Malignant neoplasm of unspecified part of unspecified bronchus or lung: Secondary | ICD-10-CM | POA: Diagnosis present

## 2013-02-24 LAB — COMPREHENSIVE METABOLIC PANEL
ALT: 75 U/L — ABNORMAL HIGH (ref 0–53)
AST: 27 U/L (ref 0–37)
Albumin: 1.9 g/dL — ABNORMAL LOW (ref 3.5–5.2)
Alkaline Phosphatase: 88 U/L (ref 39–117)
CO2: 31 mEq/L (ref 19–32)
Calcium: 8.9 mg/dL (ref 8.4–10.5)
Chloride: 95 mEq/L — ABNORMAL LOW (ref 96–112)
Creatinine, Ser: 0.77 mg/dL (ref 0.50–1.35)
GFR calc Af Amer: >90 mL/min (ref >90)
GFR calc non Af Amer: >90 mL/min (ref >90)
Glucose, Bld: 83 mg/dL (ref 70–99)
Potassium: 3.6 mEq/L (ref 3.5–5.1)
Sodium: 135 mEq/L (ref 135–145)
Total Bilirubin: 0.7 mg/dL (ref 0.3–1.2)

## 2013-02-24 LAB — CBC WITH DIFFERENTIAL/PLATELET
Basophils Relative: 0 % (ref 0–1)
Eosinophils Absolute: 0 10*3/uL (ref 0.0–0.7)
Eosinophils Relative: 0 % (ref 0–5)
Eosinophils Relative: 0 % (ref 0–5)
Hemoglobin: 12.6 g/dL — ABNORMAL LOW (ref 13.0–17.0)
Lymphs Abs: 0.3 10*3/uL — ABNORMAL LOW (ref 0.7–4.0)
MCH: 31 pg (ref 26.0–34.0)
MCH: 30.7 pg (ref 26.0–34.0)
MCHC: 35.1 g/dL (ref 30.0–36.0)
MCV: 88.3 fL (ref 78.0–100.0)
Monocytes Absolute: 0.1 10*3/uL (ref 0.1–1.0)
Monocytes Absolute: 0.1 10*3/uL (ref 0.1–1.0)
Monocytes Relative: 6 % (ref 3–12)
Neutrophils Relative %: 87 % — ABNORMAL HIGH (ref 43–77)
Platelets: 29 10*3/uL — CL (ref 150–400)
RBC: 4.23 MIL/uL (ref 4.22–5.81)
RBC: 4.11 MIL/uL — ABNORMAL LOW (ref 4.22–5.81)
WBC Morphology: INCREASED

## 2013-02-24 LAB — URINALYSIS, ROUTINE W REFLEX MICROSCOPIC
Glucose, UA: NEGATIVE mg/dL
Hgb urine dipstick: NEGATIVE
Specific Gravity, Urine: 1.027 (ref 1.005–1.030)

## 2013-02-24 MED ORDER — GUAIFENESIN ER 600 MG PO TB12
1200.0000 mg | ORAL_TABLET | Freq: Two times a day (BID) | ORAL | Status: DC
  Administered 2013-02-24 – 2013-03-02 (×12): 1200 mg via ORAL
  Filled 2013-02-24 (×13): qty 2

## 2013-02-24 MED ORDER — ONDANSETRON HCL 4 MG/2ML IJ SOLN
4.0000 mg | Freq: Four times a day (QID) | INTRAMUSCULAR | Status: DC | PRN
  Administered 2013-02-24: 4 mg via INTRAVENOUS
  Filled 2013-02-24: qty 2

## 2013-02-24 MED ORDER — DEXAMETHASONE 4 MG PO TABS
4.0000 mg | ORAL_TABLET | Freq: Four times a day (QID) | ORAL | Status: DC
Start: 2013-02-24 — End: 2013-02-24
  Administered 2013-02-24: 4 mg via ORAL
  Filled 2013-02-24 (×4): qty 1

## 2013-02-24 MED ORDER — ASPIRIN EC 81 MG PO TBEC
81.0000 mg | DELAYED_RELEASE_TABLET | Freq: Every day | ORAL | Status: DC
  Administered 2013-02-24 – 2013-03-02 (×7): 81 mg via ORAL
  Filled 2013-02-24 (×7): qty 1

## 2013-02-24 MED ORDER — GABAPENTIN 300 MG PO CAPS
600.0000 mg | ORAL_CAPSULE | Freq: Three times a day (TID) | ORAL | Status: DC
  Administered 2013-02-24 – 2013-03-02 (×17): 600 mg via ORAL
  Filled 2013-02-24 (×20): qty 2

## 2013-02-24 MED ORDER — LORATADINE 10 MG PO TABS
10.0000 mg | ORAL_TABLET | Freq: Every day | ORAL | Status: DC
  Administered 2013-02-24 – 2013-03-02 (×7): 10 mg via ORAL
  Filled 2013-02-24 (×7): qty 1

## 2013-02-24 MED ORDER — ACETAMINOPHEN 325 MG PO TABS: 650.0000 mg | ORAL_TABLET | Freq: Four times a day (QID) | ORAL | Status: DC | PRN

## 2013-02-24 MED ORDER — POLYETHYLENE GLYCOL 3350 17 G PO PACK
17.0000 g | PACK | Freq: Every day | ORAL | Status: DC
  Administered 2013-02-24 – 2013-02-28 (×4): 17 g via ORAL
  Filled 2013-02-24 (×7): qty 1

## 2013-02-24 MED ORDER — SODIUM CHLORIDE 0.9 % IV SOLN
INTRAVENOUS | Status: DC
  Administered 2013-02-24 – 2013-02-26 (×4): via INTRAVENOUS

## 2013-02-24 MED ORDER — CITALOPRAM HYDROBROMIDE 20 MG PO TABS
20.0000 mg | ORAL_TABLET | Freq: Every evening | ORAL | Status: DC
  Administered 2013-02-24 – 2013-03-01 (×6): 20 mg via ORAL
  Filled 2013-02-24 (×7): qty 1

## 2013-02-24 MED ORDER — VANCOMYCIN HCL IN DEXTROSE 1-5 GM/200ML-% IV SOLN
1000.0000 mg | Freq: Once | INTRAVENOUS | Status: AC
  Administered 2013-02-24: 1000 mg via INTRAVENOUS
  Filled 2013-02-24: qty 200

## 2013-02-24 MED ORDER — CLONAZEPAM 0.5 MG PO TABS
0.5000 mg | ORAL_TABLET | Freq: Two times a day (BID) | ORAL | Status: DC | PRN
Start: 2013-02-24 — End: 2013-03-02
  Administered 2013-02-28: 0.5 mg via ORAL
  Filled 2013-02-24: qty 1

## 2013-02-24 MED ORDER — AMITRIPTYLINE HCL 25 MG PO TABS
25.0000 mg | ORAL_TABLET | Freq: Every day | ORAL | Status: DC
  Administered 2013-02-24 – 2013-03-01 (×6): 25 mg via ORAL
  Filled 2013-02-24 (×7): qty 1

## 2013-02-24 MED ORDER — SODIUM CHLORIDE 0.9 % IV SOLN
INTRAVENOUS | Status: AC
  Administered 2013-02-24: 16:00:00 via INTRAVENOUS

## 2013-02-24 MED ORDER — TRIAMCINOLONE ACETONIDE 0.1 % EX CREA
1.0000 "application " | TOPICAL_CREAM | Freq: Two times a day (BID) | CUTANEOUS | Status: DC
  Administered 2013-02-24 – 2013-03-01 (×8): 1 via TOPICAL
  Filled 2013-02-24: qty 15

## 2013-02-24 MED ORDER — MORPHINE SULFATE 2 MG/ML IJ SOLN: 1.0000 mg | INTRAMUSCULAR | Status: DC | PRN

## 2013-02-24 MED ORDER — PIPERACILLIN SOD-TAZOBACTAM SO 2.25 (2-0.25) G IV SOLR
3.3750 g | Freq: Once | INTRAVENOUS | Status: AC
  Administered 2013-02-24: 3.375 g via INTRAVENOUS
  Filled 2013-02-24: qty 3.38

## 2013-02-24 MED ORDER — HYDROCOD POLST-CHLORPHEN POLST 10-8 MG/5ML PO LQCR
5.0000 mL | Freq: Two times a day (BID) | ORAL | Status: DC
  Administered 2013-02-24 – 2013-03-02 (×12): 5 mL via ORAL
  Filled 2013-02-24 (×12): qty 5

## 2013-02-24 MED ORDER — IPRATROPIUM BROMIDE 0.02 % IN SOLN: 0.5000 mg | RESPIRATORY_TRACT | Status: DC | PRN

## 2013-02-24 MED ORDER — PIPERACILLIN-TAZOBACTAM 3.375 G IVPB
3.3750 g | Freq: Three times a day (TID) | INTRAVENOUS | Status: DC
  Administered 2013-02-24 – 2013-03-02 (×17): 3.375 g via INTRAVENOUS
  Filled 2013-02-24 (×19): qty 50

## 2013-02-24 MED ORDER — HYDROCODONE-HOMATROPINE 5-1.5 MG/5ML PO SYRP: 5.0000 mL | ORAL_SOLUTION | Freq: Four times a day (QID) | ORAL | Status: DC | PRN

## 2013-02-24 MED ORDER — LEVETIRACETAM 100 MG/ML PO SOLN
500.0000 mg | Freq: Two times a day (BID) | ORAL | Status: DC
  Administered 2013-02-24 – 2013-03-02 (×13): 500 mg via ORAL
  Filled 2013-02-24 (×15): qty 5

## 2013-02-24 MED ORDER — DEXAMETHASONE SODIUM PHOSPHATE 4 MG/ML IJ SOLN
4.0000 mg | Freq: Four times a day (QID) | INTRAMUSCULAR | Status: DC
  Administered 2013-02-25 – 2013-02-26 (×8): 4 mg via INTRAVENOUS
  Filled 2013-02-24 (×10): qty 1

## 2013-02-24 MED ORDER — OMEGA-3-ACID ETHYL ESTERS 1 G PO CAPS
1.0000 g | ORAL_CAPSULE | Freq: Every day | ORAL | Status: DC
  Administered 2013-02-25 – 2013-03-02 (×6): 1 g via ORAL
  Filled 2013-02-24 (×7): qty 1

## 2013-02-24 MED ORDER — SUCRALFATE 1 G PO TABS
1.0000 g | ORAL_TABLET | Freq: Four times a day (QID) | ORAL | Status: DC
  Administered 2013-02-24 – 2013-02-25 (×6): 1 g via ORAL
  Filled 2013-02-24 (×10): qty 1

## 2013-02-24 MED ORDER — MAGIC MOUTHWASH W/LIDOCAINE
5.0000 mL | Freq: Four times a day (QID) | ORAL | Status: DC | PRN
  Administered 2013-02-25 – 2013-02-28 (×2): 5 mL via ORAL
  Filled 2013-02-24 (×2): qty 5

## 2013-02-24 MED ORDER — OMEGA-3 FATTY ACIDS 1000 MG PO CAPS: 1.0000 g | ORAL_CAPSULE | Freq: Every day | ORAL | Status: DC

## 2013-02-24 MED ORDER — ALBUTEROL SULFATE HFA 108 (90 BASE) MCG/ACT IN AERS
2.0000 | INHALATION_SPRAY | Freq: Four times a day (QID) | RESPIRATORY_TRACT | Status: DC | PRN
  Filled 2013-02-24: qty 6.7

## 2013-02-24 MED ORDER — ONDANSETRON HCL 4 MG PO TABS: 4.0000 mg | ORAL_TABLET | Freq: Four times a day (QID) | ORAL | Status: DC | PRN

## 2013-02-24 MED ORDER — LEVOFLOXACIN IN D5W 750 MG/150ML IV SOLN
750.0000 mg | INTRAVENOUS | Status: DC
  Filled 2013-02-24 (×2): qty 150

## 2013-02-24 MED ORDER — ALUM & MAG HYDROXIDE-SIMETH 200-200-20 MG/5ML PO SUSP
30.0000 mL | Freq: Four times a day (QID) | ORAL | Status: DC | PRN
Start: 2013-02-24 — End: 2013-03-02
  Administered 2013-02-24: 30 mL via ORAL
  Filled 2013-02-24: qty 30

## 2013-02-24 MED ORDER — ALBUTEROL SULFATE (5 MG/ML) 0.5% IN NEBU: 2.5000 mg | INHALATION_SOLUTION | RESPIRATORY_TRACT | Status: DC | PRN

## 2013-02-24 MED ORDER — CHLORPROMAZINE HCL 10 MG PO TABS
10.0000 mg | ORAL_TABLET | Freq: Four times a day (QID) | ORAL | Status: DC | PRN
  Filled 2013-02-24: qty 1

## 2013-02-24 MED ORDER — POLYETHYLENE GLYCOL 3350 17 G PO PACK
17.0000 g | PACK | Freq: Every day | ORAL | Status: DC
  Filled 2013-02-24: qty 1

## 2013-02-24 MED ORDER — POLYETHYLENE GLYCOL 3350 17 GM/SCOOP PO POWD
17.0000 g | Freq: Every day | ORAL | Status: DC
  Filled 2013-02-24: qty 255

## 2013-02-24 NOTE — ED Notes (Signed)
Patient now unable to tell me how old he is and his birth date.
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
## 2013-02-24 NOTE — ED Notes (Signed)
Patient transported to CT 

## 2013-02-24 NOTE — ED Notes (Signed)
Pupils are equal and reactive. +2

## 2013-02-24 NOTE — Progress Notes (Signed)
PT Cancellation Note  Patient Details Name: Derrick Crosby MRN: [id number] DOB: May 20, 1951   Cancelled Treatment:    Reason Eval/Treat Not Completed: Other (comment).  Pt declined OOB, but said he would walk tomorrow. Will follow.   Thanks,    Vista Deck 02/24/2013, 4:32 PM

## 2013-02-24 NOTE — H&P (Signed)
[facility] History and Physical  JAMESROBERT OHANESIAN ZOX:[id number] DOB: [birth year] DOA: 02/24/2013  Referring physician: ER physician PCP: Arlyss Queen   Chief Complaint: mental status changes  HPI:  62 y.o. male with a PMH of metastatic non-small cell lung cancer diagnosed 06/2012, under the care of Dr. 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 Patient was discharge to SNF from the previous admission and per his wife was doing relatively well, subsequently was discharged from rehab and again with pretty good overall function until last night when he suddenly did ont know who he was talking to. His wife reported he urinated on the floor thinking it was a bathroom and he could not recogine family members. His mental status has not significantly improved on arrival to ED and patient himself is not a good historian to provide details of his present or past illness. Per wife, there was no report of chest pain, no shortness of breath. He does have a chronic cough. No fever or chills. No loss of consciousness, no slurred speech. In ED, evaluation included CT had which showed improvement in left frontal lesion and surrounding edema. CXR was significant for left upper lobe airspace opacity. His BP was 97/47 but it improved with IV fluids to 112/74. His CBC revealed neutropenia of 3.0, platelet count of 29. Sodium is only mildly decreased to 134.  Assessment and Plan:  Principal Problem:   Acute encephalopathy - likely secondary to brain lesion although based on CT scan left frontal lesion and surrounding edema have improved - MRI brain to be ordered for further evaluation but it will be done in am to allow patient to rest (per wife request)  - continue decadron 4 mg Q 6 hours IV - needs SLP evaluation Active Problems:  Anxiety and Depression - Continue Celexa, Elavil, and Klonopin   Squamous cell lung cancer - management per oncology   COPD (chronic obstructive pulmonary disease) - Continue albuterol 2 puffs every 6 hours as needed for wheezing.  - Continue Mucinex and Hycodan for cough. 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  Family Communication: [name], wife, [phone] updated at bedside.  .  Medical Consultants:  None  Other Consultants:  Physical therapy  Occupational therapy Anti-infectives:  Levaquin 02/24/2013 --> Zosyn 02/24/2013 -->  Code Status: Full Family Communication: Pt at bedside Disposition Plan: Admit for further evaluation  [name], MD  [facility] Pager [phone]  If 7PM-7AM, please contact night-coverage [email or web address] Password [email or web address] 02/24/2013, 1:53 PM  Review of Systems:  Unable to obtain due to patient's mental status.  Past Medical History  Diagnosis Date  . Hypertension   . Hyperglyceridemia, pure   . COPD (chronic obstructive pulmonary disease)   . Hemoptysis   . Anxiety   . Depression   .  Hemoptysis   . Seasonal allergies   . History of radiation therapy 07/05/12-07/26/12    lung ca   . Cancer     Skin  Cancer - Left ear.-  . Skin cancer     left ear  . Lung cancer 06/13/12    lung mass /right hilar suspicious for primary bronchogenic ca  . Metastasis to brain 01/2013    lung primary   Past  Surgical History  Procedure Laterality Date  . Tympanoplasty    . Video bronchoscopy  06/13/12    with endobronchial u/s/ with biospy left hilar mass/Dr. Lonia Farber  . Tonsillectomy      child   Social History:  reports that he quit smoking about 8 months ago. His smoking use included Cigarettes. He has a 25 pack-year smoking history. He has never used smokeless tobacco. He reports that he does not drink alcohol or use illicit drugs.  No Known Allergies  Family History:  Family History  Problem Relation Age of Onset  . Lung cancer Mother   . Cancer Mother     lung former smoker, age 53 now     Prior to Admission medications   Medication Sig Start Date End Date Taking? Authorizing Provider  acetaminophen (TYLENOL) 325 MG tablet Take 650 mg by mouth every 6 (six) hours as needed. For general aches    Historical Provider, MD  albuterol (PROVENTIL HFA;VENTOLIN HFA) 108 (90 BASE) MCG/ACT inhaler Inhale 2 puffs into the lungs every 6 (six) hours as needed for wheezing.     Historical Provider, MD  alum & mag hydroxide-simeth (MAALOX/MYLANTA) 200-200-20 MG/5ML suspension Take 30 mLs by mouth every 6 (six) hours as needed. 01/29/13   Alison Murray, MD  Alum & Mag Hydroxide-Simeth (MAGIC MOUTHWASH W/LIDOCAINE) SOLN Take 5 mLs by mouth 4 (four) times daily as needed. 01/29/13   Alison Murray, MD  amitriptyline (ELAVIL) 25 MG tablet Take 1 tablet (25 mg total) by mouth at bedtime. 01/29/13   Alison Murray, MD  aspirin EC 81 MG tablet Take 81 mg by mouth daily.    Historical Provider, MD  chlorproMAZINE (THORAZINE) 10 MG tablet Take 1 tablet (10 mg total) by mouth 4 (four) times daily as needed. 01/29/13   Alison Murray, MD  citalopram (CELEXA) 20 MG tablet Take 1 tablet (20 mg total) by mouth every evening. 01/29/13   Alison Murray, MD  clonazePAM (KLONOPIN) 0.5 MG tablet Take 1 tablet (0.5 mg total) by mouth 2 (two) times daily as needed for anxiety. For anxiety 01/29/13   Alison Murray,  MD  dexamethasone (DECADRON) 4 MG tablet Take 1 tablet (4 mg total) by mouth every 6 (six) hours. 01/29/13   Alison Murray, MD  fish oil-omega-3 fatty acids 1000 MG capsule Take 1 g by mouth daily.     Historical Provider, MD  gabapentin (NEURONTIN) 300 MG capsule Take 600 mg by mouth 3 (three) times daily.    Historical Provider, MD  guaiFENesin (MUCINEX) 600 MG 12 hr tablet Take 1,200 mg by mouth 2 (two) times daily.    Historical Provider, MD  HYDROcodone-homatropine (HYCODAN) 5-1.5 MG/5ML syrup Take 5 mLs by mouth every 6 (six) hours as needed for cough. 01/29/13   Alison Murray, MD  levETIRAcetam (KEPPRA) 100 MG/ML solution Take 5 mLs (500 mg total) by mouth 2 (two) times daily. 01/29/13   Alison Murray, MD  lidocaine-prilocaine (EMLA) cream Apply 1 application topically as needed. Apply to  Port A cath site 30-60 min before chemotherapy. Used every Wednesday 06/27/12 06/27/13  [name], MD  loratadine (CLARITIN) 10 MG tablet Take 10 mg by mouth daily.    Historical Provider, MD  omega-3 acid ethyl esters (LOVAZA) 1 G capsule Take 1 capsule (1 g total) by mouth daily. 01/29/13   Alison Murray, MD  polyethylene glycol [facility] Ethelene Hal) packet Take 17 g by mouth daily. 01/29/13   Alison Murray, MD  polyethylene glycol powder (GLYCOLAX/MIRALAX) powder Take 17 g by mouth daily. 09/04/12   Arman Filter, NP  sucralfate (CARAFATE) 1 G tablet Take 1 g by mouth 4 (four) times daily. Dissolve in 15 CC of water    Historical Provider, MD  triamcinolone cream (KENALOG) 0.1 % Apply 1 application topically 2 (two) times daily. Apply to nose and face for dry skin    Historical Provider, MD   Physical Exam: Filed Vitals:   02/24/13 1109 02/24/13 1347  BP: 112/74 97/47  Pulse: 69 67  Temp: 97.7 F (36.5 C) 98.5 F (36.9 C)  TempSrc: Oral Oral  Resp: 16 16  SpO2: 96% 92%    Physical Exam  Constitutional: Appears ill. No distress.  HENT: Normocephalic. No tonsillar erythema or exudates Eyes:  Conjunctivae are normal. PERRLA, no scleral icterus.  Neck: Normal ROM. Neck supple. No JVD. No tracheal deviation. No thyromegaly.  CVS: RRR, S1/S2 +, no murmurs, no gallops, no carotid bruit.  Pulmonary: diminished breath sounds; no wheezing  Abdominal: Soft. BS +,  no distension, tenderness, rebound or guarding.  Musculoskeletal: Normal range of motion. No edema and no tenderness.  Lymphadenopathy: No lymphadenopathy noted, cervical, inguinal. Neuro: Alert. Not oriented, no focal neurologic deficits Skin: Skin is warm and dry. scalp is red Psychiatric: unable to test due to altered mental status  Labs on Admission:  Basic Metabolic Panel:  Recent Labs Lab 02/24/13 1140  NA 134*  K 3.6  CL 96  CO2 28  GLUCOSE 97  BUN 21  CREATININE 0.77  CALCIUM 8.9   Liver Function Tests:  Recent Labs Lab 02/24/13 1140  AST 27  ALT 82*  ALKPHOS 96  BILITOT 0.6  PROT 5.9*  ALBUMIN 1.9*   CBC:  Recent Labs Lab 02/24/13 1140  WBC 3.0*  NEUTROABS 2.6  HGB 13.1  HCT 37.3*  MCV 88.2  PLT 29*   Radiological Exams on Admission: Dg Chest 2 View 02/24/2013  * IMPRESSION: Left upper lobe airspace opacity.  Recommend correlation for any interval radiation to this area has this could represent radiation pneumonitis.  Cannot exclude pneumonia.   Original Report Authenticated By: Charlett Nose, M.D.    Ct Head Wo Contrast 02/24/2013  * IMPRESSION: Significant improvement in the left frontal lesion and surrounding edema.  No acute infarction or hemorrhage.   Original Report Authenticated By: Charlett Nose, M.D.     EKG: Normal sinus rhythm, no ST/T wave changes  Time spent: 75 minutes

## 2013-02-24 NOTE — ED Notes (Signed)
Patient not violent but strongly refusing to give a urine sample.

## 2013-02-24 NOTE — ED Notes (Signed)
Patient is alert and oriented X3. Follows commands. Patient has refused to take medication.(taking medications for seizures.

## 2013-02-24 NOTE — ED Provider Notes (Signed)
History     CSN: [id number]  Arrival date & time 02/24/13  1056   First MD Initiated Contact with Patient 02/24/13 1111      Chief Complaint  Patient presents with  . Altered Mental Status    (Consider location/radiation/quality/duration/timing/severity/associated sxs/prior treatment) Patient is a 62 y.o. male presenting with altered mental status. The history is provided by the patient and a relative.  Altered Mental Status   patient here with altered mental status x2 weeks which is been waxing and waning. Patient has a history of lung cancer with brain metastasis. He is not undergoing any treatment at this time. Family denies fever or chills or cough. Symptoms can last from hours to days. Was started on keppra recently. No urinary symptoms. Nothing seems to make them worse. Patient's appetite has been normal. He has been refusing to take his medications in the morning. Called her Dr. was told to come here for further evaluation.  Past Medical History  Diagnosis Date  . Hypertension   . Hyperglyceridemia, pure   . COPD (chronic obstructive pulmonary disease)   . Hemoptysis   . Anxiety   . Depression   . Hemoptysis   . Seasonal allergies   . History of radiation therapy 07/05/12-07/26/12    lung ca   . Cancer     Skin  Cancer - Left ear.-  . Skin cancer     left ear  . Lung cancer 06/13/12    lung mass /right hilar suspicious for primary bronchogenic ca  . Metastasis to brain 01/2013    lung primary    Past Surgical History  Procedure Laterality Date  . Tympanoplasty    . Video bronchoscopy  06/13/12    with endobronchial u/s/ with biospy left hilar mass/Dr. Lonia Farber  . Tonsillectomy      child    Family History  Problem Relation Age of Onset  . Lung cancer Mother   . Cancer Mother     lung former smoker, age 74 now    History  Substance Use Topics  . Smoking status: Former Smoker -- 0.50 packs/day for 50 years    Types: Cigarettes    Quit date:  06/14/2012  . Smokeless tobacco: Never Used     Comment: pt trying to quit  . Alcohol Use: No     Comment: occasionally beer       Review of Systems  Psychiatric/Behavioral: Positive for altered mental status.  All other systems reviewed and are negative.    Allergies  Review of patient's allergies indicates no known allergies.  Home Medications   Current Outpatient Rx  Name  Route  Sig  Dispense  Refill  . acetaminophen (TYLENOL) 325 MG tablet   Oral   Take 650 mg by mouth every 6 (six) hours as needed. For general aches         . albuterol (PROVENTIL HFA;VENTOLIN HFA) 108 (90 BASE) MCG/ACT inhaler   Inhalation   Inhale 2 puffs into the lungs every 6 (six) hours as needed for wheezing.          Marland Kitchen alum & mag hydroxide-simeth (MAALOX/MYLANTA) 200-200-20 MG/5ML suspension   Oral   Take 30 mLs by mouth every 6 (six) hours as needed.   355 mL   0   . Alum & Mag Hydroxide-Simeth (MAGIC MOUTHWASH W/LIDOCAINE) SOLN   Oral   Take 5 mLs by mouth 4 (four) times daily as needed.   25 mL   0   .  amitriptyline (ELAVIL) 25 MG tablet   Oral   Take 1 tablet (25 mg total) by mouth at bedtime.   30 tablet   2   . aspirin EC 81 MG tablet   Oral   Take 81 mg by mouth daily.         . chlorproMAZINE (THORAZINE) 10 MG tablet   Oral   Take 1 tablet (10 mg total) by mouth 4 (four) times daily as needed.   90 tablet   0   . citalopram (CELEXA) 20 MG tablet   Oral   Take 1 tablet (20 mg total) by mouth every evening.   30 tablet   0   . clonazePAM (KLONOPIN) 0.5 MG tablet   Oral   Take 1 tablet (0.5 mg total) by mouth 2 (two) times daily as needed for anxiety. For anxiety   30 tablet   0   . dexamethasone (DECADRON) 4 MG tablet   Oral   Take 1 tablet (4 mg total) by mouth every 6 (six) hours.   120 tablet   0   . fish oil-omega-3 fatty acids 1000 MG capsule   Oral   Take 1 g by mouth daily.          Marland Kitchen gabapentin (NEURONTIN) 300 MG capsule   Oral   Take  600 mg by mouth 3 (three) times daily.         Marland Kitchen guaiFENesin (MUCINEX) 600 MG 12 hr tablet   Oral   Take 1,200 mg by mouth 2 (two) times daily.         Marland Kitchen HYDROcodone-homatropine (HYCODAN) 5-1.5 MG/5ML syrup   Oral   Take 5 mLs by mouth every 6 (six) hours as needed for cough.   240 mL   0   . levETIRAcetam (KEPPRA) 100 MG/ML solution   Oral   Take 5 mLs (500 mg total) by mouth 2 (two) times daily.   473 mL   0   . lidocaine-prilocaine (EMLA) cream   Topical   Apply 1 application topically as needed. Apply to [facility] A cath site 30-60 min before chemotherapy. Used every Wednesday         . loratadine (CLARITIN) 10 MG tablet   Oral   Take 10 mg by mouth daily.         Marland Kitchen omega-3 acid ethyl esters (LOVAZA) 1 G capsule   Oral   Take 1 capsule (1 g total) by mouth daily.   30 capsule   0   . polyethylene glycol (MIRALAX / GLYCOLAX) packet   Oral   Take 17 g by mouth daily.   14 each   0   . polyethylene glycol powder (GLYCOLAX/MIRALAX) powder   Oral   Take 17 g by mouth daily.   255 g   0   . sucralfate (CARAFATE) 1 G tablet   Oral   Take 1 g by mouth 4 (four) times daily. Dissolve in 15 CC of water         . triamcinolone cream (KENALOG) 0.1 %   Topical   Apply 1 application topically 2 (two) times daily. Apply to nose and face for dry skin           BP 112/74  Pulse 69  Temp(Src) 97.7 F (36.5 C) (Oral)  Resp 16  SpO2 96%  Physical Exam  Nursing note and vitals reviewed. Constitutional: He is oriented to person, place, and time. He appears well-developed and well-nourished.  Non-toxic  appearance. No distress.  HENT:  Head: Normocephalic and atraumatic.  Eyes: Conjunctivae, EOM and lids are normal. Pupils are equal, round, and reactive to light.  Neck: Normal range of motion. Neck supple. No tracheal deviation present. No mass present.  Cardiovascular: Normal rate, regular rhythm and normal heart sounds.  Exam reveals no gallop.   No murmur  heard. Pulmonary/Chest: Effort normal and breath sounds normal. No stridor. No respiratory distress. He has no decreased breath sounds. He has no wheezes. He has no rhonchi. He has no rales.  Abdominal: Soft. Normal appearance and bowel sounds are normal. He exhibits no distension. There is no tenderness. There is no rebound and no CVA tenderness.  Musculoskeletal: Normal range of motion. He exhibits no edema and no tenderness.  Neurological: He is alert and oriented to person, place, and time. He has normal strength. No cranial nerve deficit or sensory deficit. GCS eye subscore is 4. GCS verbal subscore is 5. GCS motor subscore is 6.  Skin: Skin is warm and dry. No abrasion and no rash noted.  Psychiatric: His affect is blunt. His speech is delayed. He is slowed.    ED Course  Procedures (including critical care time)  Labs Reviewed  CBC WITH DIFFERENTIAL  COMPREHENSIVE METABOLIC PANEL  URINALYSIS, ROUTINE W REFLEX MICROSCOPIC   No results found.   No diagnosis found.    MDM  Patient endorses increased cough and congestion. Chest x-ray suspicious for pneumonia. He is also more thrombocytopenic. Start patient on vancomycin and Zosyn for associated pneumonia. [facility]        Toy Baker, MD 02/24/13 1320

## 2013-02-24 NOTE — ED Notes (Signed)
MD Freida Busman made aware of platelet count

## 2013-02-24 NOTE — ED Notes (Signed)
Bed:WA05<BR> Expected date:<BR> Expected time:<BR> Means of arrival:<BR> Comments:<BR>

## 2013-02-25 ENCOUNTER — Inpatient Hospital Stay (HOSPITAL_COMMUNITY): Payer: BC Managed Care – PPO

## 2013-02-25 DIAGNOSIS — D72819 Decreased white blood cell count, unspecified: Secondary | ICD-10-CM

## 2013-02-25 DIAGNOSIS — J189 Pneumonia, unspecified organism: Principal | ICD-10-CM

## 2013-02-25 LAB — CBC
Hemoglobin: 11.4 g/dL — ABNORMAL LOW (ref 13.0–17.0)
MCH: 30.6 pg (ref 26.0–34.0)
MCHC: 35 g/dL (ref 30.0–36.0)
Platelets: 24 10*3/uL — CL (ref 150–400)

## 2013-02-25 LAB — COMPREHENSIVE METABOLIC PANEL
ALT: 62 U/L — ABNORMAL HIGH (ref 0–53)
CO2: 29 mEq/L (ref 19–32)
Calcium: 8.4 mg/dL (ref 8.4–10.5)
Chloride: 96 mEq/L (ref 96–112)
Creatinine, Ser: 0.84 mg/dL (ref 0.50–1.35)
GFR calc Af Amer: >90 mL/min (ref >90)
GFR calc non Af Amer: >90 mL/min (ref >90)
Glucose, Bld: 137 mg/dL — ABNORMAL HIGH (ref 70–99)
Sodium: 132 mEq/L — ABNORMAL LOW (ref 135–145)
Total Bilirubin: 0.4 mg/dL (ref 0.3–1.2)

## 2013-02-25 LAB — LEGIONELLA ANTIGEN, URINE

## 2013-02-25 LAB — GLUCOSE, CAPILLARY

## 2013-02-25 MED ORDER — VANCOMYCIN HCL IN DEXTROSE 750-5 MG/150ML-% IV SOLN
750.0000 mg | Freq: Three times a day (TID) | INTRAVENOUS | Status: DC
  Administered 2013-02-25 – 2013-02-28 (×9): 750 mg via INTRAVENOUS
  Filled 2013-02-25 (×10): qty 150

## 2013-02-25 MED ORDER — SODIUM CHLORIDE 0.9 % IJ SOLN
10.0000 mL | INTRAMUSCULAR | Status: DC | PRN
  Administered 2013-02-25 – 2013-02-27 (×2): 10 mL

## 2013-02-25 MED ORDER — GADOBENATE DIMEGLUMINE 529 MG/ML IV SOLN
14.0000 mL | Freq: Once | INTRAVENOUS | Status: AC | PRN
  Administered 2013-02-25: 14 mL via INTRAVENOUS

## 2013-02-25 MED ORDER — BOOST / RESOURCE BREEZE PO LIQD
1.0000 | Freq: Three times a day (TID) | ORAL | Status: DC
  Administered 2013-02-25 – 2013-03-01 (×5): 1 via ORAL

## 2013-02-25 NOTE — Progress Notes (Signed)
Pt alert x 4, is having some urine retention , bladder scan done, 500cc residual. Order given for in and out cath. Will continue to monitor.

## 2013-02-25 NOTE — Progress Notes (Signed)
INITIAL NUTRITION ASSESSMENT  DOCUMENTATION CODES Per approved criteria  -Non-severe (moderate) malnutrition in the context of chronic illness  Patient meets the criteria for NON-SEVERE malnutrition in the context of chronic illness with PO intake <75% estimated needs for >1 month and muscle loss at the clavicle, calf, and knee.   INTERVENTION: - Resource Breeze TID (8 oz provides 250 kcal, 9 g protein) - Diet per SLP, if unable to tolerate oral intake, patient may require enteral nutrition support.   NUTRITION DIAGNOSIS: Inadequate oral intake related to swallowing difficulty as evidenced by 0% intake.   Goal: Patient will meet >/=90% of estimated nutrition needs  Monitor:  PO intake, weight, labs  Reason for Assessment: Consult, Poor PO intake  62 y.o. male  Admitting Dx: Acute encephalopathy  ASSESSMENT: Patient with a history of non-small cell lung cancer with metastases to the brain. He is s/p chemotherapy and radiation. He complains of pain when swallowing, especially solid foods. Bedside swallow evaluation recommended clear liquids for comfort and GI consult to assess esophageal function. Per wife, patient is not eating much due to fear of swallowing. She is concerned about him getting adequate nutrition.   Height: Ht Readings from Last 1 Encounters:  02/13/13 5\' 9"  (1.753 m)    Weight: Wt Readings from Last 1 Encounters:  02/24/13 157 lb 3 oz (71.3 kg)    Ideal Body Weight: 72.7 kg  % Ideal Body Weight: 99%  Wt Readings from Last 10 Encounters:  02/24/13 157 lb 3 oz (71.3 kg)  02/13/13 157 lb 1.6 oz (71.26 kg)  02/12/13 158 lb (71.668 kg)  02/06/13 158 lb 6.4 oz (71.85 kg)  01/30/13 163 lb (73.936 kg)  01/22/13 168 lb (76.204 kg)  11/29/12 171 lb (77.565 kg)  11/22/12 170 lb 6.4 oz (77.293 kg)  11/09/12 164 lb 3.2 oz (74.481 kg)  10/18/12 163 lb 3.2 oz (74.027 kg)    Usual Body Weight: 75 kg  % Usual Body Weight: 95%  BMI:  Body mass index is 23.2  kg/(m^2). Patient is normal weight.   Estimated Nutritional Needs: Kcal: 1950-2100 kcal Protein: 85-100 g Fluid: > 2.1 L  Skin: Intact  Diet Order: General. 0%  EDUCATION NEEDS: -No education needs identified at this time   Intake/Output Summary (Last 24 hours) at 02/25/13 1301 Last data filed at 02/25/13 0544  Gross per 24 hour  Intake    100 ml  Output    500 ml  Net   -400 ml    Last BM: PTA   Labs:   Recent Labs Lab 02/24/13 1140 02/24/13 1510 02/25/13 0531  NA 134* 135 132*  K 3.6 3.6 3.8  CL 96 95* 96  CO2 28 31 29   BUN 21 21 21   CREATININE 0.77 0.81 0.84  CALCIUM 8.9 8.7 8.4  MG  --  1.7  --   PHOS  --  3.9  --   GLUCOSE 97 83 137*    CBG (last 3)   Recent Labs  02/25/13 0742  GLUCAP 122*    Scheduled Meds: . amitriptyline  25 mg Oral QHS  . aspirin EC  81 mg Oral Daily  . chlorpheniramine-HYDROcodone  5 mL Oral Q12H  . citalopram  20 mg Oral QPM  . dexamethasone  4 mg Intravenous Q6H  . gabapentin  600 mg Oral TID  . guaiFENesin  1,200 mg Oral BID  . levETIRAcetam  500 mg Oral BID  . levofloxacin (LEVAQUIN) IV  750 mg Intravenous Q24H  .  loratadine  10 mg Oral Daily  . omega-3 acid ethyl esters  1 g Oral Daily  . piperacillin-tazobactam (ZOSYN)  IV  3.375 g Intravenous Q8H  . polyethylene glycol  17 g Oral Daily  . sucralfate  1 g Oral QID  . triamcinolone cream  1 application Topical BID    Continuous Infusions: . sodium chloride 20 mL/hr at 02/25/13 0825    Past Medical History  Diagnosis Date  . Hypertension   . Hyperglyceridemia, pure   . COPD (chronic obstructive pulmonary disease)   . Hemoptysis   . Anxiety   . Depression   . Hemoptysis   . Seasonal allergies   . History of radiation therapy 07/05/12-07/26/12    lung ca   . Cancer     Skin  Cancer - Left ear.-  . Skin cancer     left ear  . Lung cancer 06/13/12    lung mass /right hilar suspicious for primary bronchogenic ca  . Metastasis to brain 01/2013    lung  primary    Past Surgical History  Procedure Laterality Date  . Tympanoplasty    . Video bronchoscopy  06/13/12    with endobronchial u/s/ with biospy left hilar mass/Dr. Lonia Farber  . Tonsillectomy      child    Linnell Fulling, RD, LDN Pager #: [phone] After-Hours Pager #: [phone]

## 2013-02-25 NOTE — Evaluation (Addendum)
Clinical/Bedside Swallow Evaluation Patient Details  Name: Derrick Crosby MRN: [id number] Date of Birth: June 13, 1951  Today's Date: 02/25/2013 Time: 0950-1015 SLP Time Calculation (min): 25 min  Past Medical History:  Past Medical History  Diagnosis Date  . Hypertension   . Hyperglyceridemia, pure   . COPD (chronic obstructive pulmonary disease)   . Hemoptysis   . Anxiety   . Depression   . Hemoptysis   . Seasonal allergies   . History of radiation therapy 07/05/12-07/26/12    lung ca   . Cancer     Skin  Cancer - Left ear.-  . Skin cancer     left ear  . Lung cancer 06/13/12    lung mass /right hilar suspicious for primary bronchogenic ca  . 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 Tympanoplasty    . Video bronchoscopy  06/13/12    with endobronchial u/s/ with biospy left hilar mass/Dr. Lonia Farber  . Tonsillectomy      child   HPI:  62 y.o. male with a PMH of metastatic non-small cell lung cancer diagnosed 06/2012, under the care of Dr. 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 Patient was discharge to SNF from the previous admission and per his wife was doing relatively well, subsequently was discharged from rehab and again with pretty good overall function until last night when he suddenly did ont know who he was talking to. His wife reported he urinated on the floor thinking it was a bathroom and he could not recogine family members. His mental status has not significantly improved on arrival to ED and patient himself is not a good historian to provide details of his present or past illness. Per wife, there was no report of chest pain, no shortness of breath. He does have a chronic cough. No fever or chills. No loss of  consciousness, no slurred speech.  BSE indicated to assess risk for aspiration.  Patient's spouse describes dysphagia as pain during swallow with patient hesitant to eat because of discomfort.  BSE completed on 3/24 with diet recommendations of dysphagia 2/thin liquids and recommendations for GI consult.   Assessment / Plan / Recommendation Clinical Impression  Evaluation limited secondary to reports of odynphagia specifically with soft solids.  No outward clinical s/s of aspiration noted with thin water, puree, and mechanical soft trials.  Patient reports globus sensation after swallow of solids requiring several sips of liquid to clear.  Patient also reporting pain in left area of abdomen during and after swallow.  Pain reported during swallow but patient states thin liquid and puree easier to swallow in comparison with solids.   May want to consider clear liquid diet consistency for comfort and proceed with GI consult to assess esoophageal functioning and source of odynphagia.  ST to follow in acute care setting.  Completion of objective evaluation to be determined.     Aspiration Risk  Moderate    Diet Recommendation Thin liquid   Liquid Administration via: Cup Medication Administration: Crushed with puree Supervision: Patient able to self feed;Intermittent supervision to cue for compensatory strategies Compensations: Slow rate;Small sips/bites;Multiple dry swallows after each bite/sip Postural Changes and/or Swallow Maneuvers: Out of bed for meals;Seated upright 90 degrees;Upright 30-60 min after meal    Other  Recommendations Oral Care Recommendations: Oral care QID   Follow Up Recommendations  Other (comment) (TBD)    Frequency and Duration min 2x/week  2 weeks       SLP Swallow Goals Patient will utilize recommended strategies during swallow to increase swallowing safety with: Moderate assistance   Swallow Study Prior Functional Status  Type of Home: Mobile home Lives With:  Spouse Available Help at Discharge: Family;Available 24 hours/day Vocation: Retired    General Date of Onset: 02/24/13 HPI: 62 y.o. male with a PMH of metastatic non-small cell lung cancer diagnosed 06/2012, under the care of Dr. 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 Patient was discharge to SNF from the previous admission and per his wife was doing relatively well, subsequently was discharged from rehab and again with pretty good overall function until last night when he suddenly did ont know who he was talking to. His wife reported he urinated on the floor thinking it was a bathroom and he could not recogine family members. His mental status has not significantly improved on arrival to ED and patient himself is not a good historian to provide details of his present or past illness. Per wife, there was no report of chest pain, no shortness of breath. He does have a chronic cough. No fever or chills. No loss of consciousness, no slurred speech. Type of Study: Bedside swallow evaluation Diet Prior to this Study: Thin liquids;Regular Temperature Spikes Noted: No Respiratory Status: Supplemental O2 delivered via (comment) (nasal cannula) History of Recent Intubation: No Behavior/Cognition: Alert;Pleasant mood;Cooperative Oral Cavity - Dentition: Poor condition;Missing dentition Self-Feeding Abilities: Able to feed self Patient Positioning: Upright in chair Baseline Vocal Quality: Hoarse Volitional Cough: Strong Volitional Swallow: Able to elicit    Oral/Motor/Sensory Function Overall Oral Motor/Sensory Function: Appears within functional limits for tasks assessed   Ice Chips Ice chips: Not tested   Thin Liquid Thin Liquid: Within functional limits Presentation: Cup    Nectar Thick Nectar Thick Liquid: Not tested   Honey  Thick Honey Thick Liquid: Not tested   Puree Puree: Within functional limits Presentation: Self Fed;Spoon   Solid   GO    Solid: Impaired Pharyngeal Phase Impairments: Other (comments) (odynophagia, globas sensation)      Moreen Fowler MS, CCC-SLP [phone] [facility] 02/25/2013,10:37 AM

## 2013-02-25 NOTE — Progress Notes (Addendum)
ANTIBIOTIC CONSULT NOTE - INITIAL  Pharmacy Consult for Vancomycin  Indication: GPC in blood/?aspiration PNA   No Known Allergies  Patient Measurements: Weight: 157 lb 3 oz (71.3 kg) (weight from 02/13/13)  Vital Signs: Temp: 97.8 F (36.6 C) (04/20 0525) Temp src: Oral (04/20 0525) BP: 107/71 mmHg (04/20 0525) Pulse Rate: 58 (04/20 0525) Intake/Output from previous day: 04/19 0701 - 04/20 0700 In: 100 [IV Piggyback:100] Out: 500 [Urine:500] Intake/Output from this shift:    Labs:  Recent Labs  02/24/13 1140 02/24/13 1510 02/25/13 0531  WBC 3.0* 2.3* 2.1*  HGB 13.1 12.6* 11.4*  PLT 29* 28* 24*  CREATININE 0.77 0.81 0.84   The CrCl is unknown because both a height and weight (above a minimum accepted value) are required for this calculation. No results found for this basename: VANCOTROUGH, VANCOPEAK, VANCORANDOM, GENTTROUGH, GENTPEAK, GENTRANDOM, TOBRATROUGH, TOBRAPEAK, TOBRARND, AMIKACINPEAK, AMIKACINTROU, AMIKACIN,  in the last 72 hours   Microbiology: Recent Results (from the past 720 hour(s))  CULTURE, BLOOD (ROUTINE X 2)     Status: None   Collection Time    02/24/13  1:20 PM      Result Value Range Status   Specimen Description BLOOD LEFT ARM   Final   Special Requests BOTTLES DRAWN AEROBIC ONLY 2.5 CC    Final   Culture  Setup Time 02/24/2013 17:47   Final   Culture     Final   Value: GRAM POSITIVE COCCI IN CLUSTERS     Note: Gram Stain Report Called to,Read Back By and Verified With: HEATHER BULLINS 02/25/13 @ 12:38PM BY RUSCA.   Report Status PENDING   Incomplete  CULTURE, BLOOD (ROUTINE X 2)     Status: None   Collection Time    02/24/13  1:26 PM      Result Value Range Status   Specimen Description BLOOD RIGHT ANTECUBITAL   Final   Special Requests     Final   Value: BOTTLES DRAWN AEROBIC AND ANAEROBIC 4 CC BLUE, 3 CC RED   Culture  Setup Time 02/24/2013 17:48   Final   Culture     Final   Value:        BLOOD CULTURE RECEIVED NO GROWTH TO DATE  CULTURE WILL BE HELD FOR 5 DAYS BEFORE ISSUING A FINAL NEGATIVE REPORT   Report Status PENDING   Incomplete  MRSA PCR SCREENING     Status: None   Collection Time    02/24/13  5:45 PM      Result Value Range Status   MRSA by PCR NEGATIVE  NEGATIVE Final   Comment:            The GeneXpert MRSA Assay (FDA     approved for NASAL specimens     only), is one component of a     comprehensive MRSA colonization     surveillance program. It is not     intended to diagnose MRSA     infection nor to guide or     monitor treatment for     MRSA infections.    Medical History: Past Medical History  Diagnosis Date  . Hypertension   . Hyperglyceridemia, pure   . COPD (chronic obstructive pulmonary disease)   . Hemoptysis   . Anxiety   . Depression   . Hemoptysis   . Seasonal allergies   . History of radiation therapy 07/05/12-07/26/12    lung ca   . Cancer     Skin  Cancer -  Left ear.-  . Skin cancer     left ear  . Lung cancer 06/13/12    lung mass /right hilar suspicious for primary bronchogenic ca  . 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 [COMPLETED] sodium chloride   Intravenous STAT  . amitriptyline  25 mg Oral QHS  . aspirin EC  81 mg Oral Daily  . chlorpheniramine-HYDROcodone  5 mL Oral Q12H  . citalopram  20 mg Oral QPM  . dexamethasone  4 mg Intravenous Q6H  . feeding supplement  1 Container Oral TID BM  . gabapentin  600 mg Oral TID  . guaiFENesin  1,200 mg Oral BID  . levETIRAcetam  500 mg Oral BID  . loratadine  10 mg Oral Daily  . omega-3 acid ethyl esters  1 g Oral Daily  . [COMPLETED] piperacillin-tazobactam (ZOSYN)  IV  3.375 g Intravenous Once  . piperacillin-tazobactam (ZOSYN)  IV  3.375 g Intravenous Q8H  . polyethylene glycol  17 g Oral Daily  . sucralfate  1 g Oral QID  . triamcinolone cream  1 application Topical BID  . [COMPLETED] vancomycin  1,000 mg Intravenous Once  . [DISCONTINUED] dexamethasone  4 mg Oral Q6H   . [DISCONTINUED] fish oil-omega-3 fatty acids  1 g Oral Daily  . [DISCONTINUED] levofloxacin (LEVAQUIN) IV  750 mg Intravenous Q24H  . [DISCONTINUED] polyethylene glycol  17 g Oral Daily  . [DISCONTINUED] polyethylene glycol powder  17 g Oral Daily   Infusions:  . sodium chloride 20 mL/hr at 02/25/13 0825   PRN: acetaminophen, albuterol, albuterol, alum & mag hydroxide-simeth, chlorproMAZINE, clonazePAM, ipratropium, magic mouthwash w/lidocaine, morphine injection, ondansetron (ZOFRAN) IV, ondansetron, sodium chloride, [DISCONTINUED] HYDROcodone-homatropine Assessment:  62 yo M with NSCLC with metastases to brain, admitted with acute encephalopathy.  Questionable aspiration PNA on admission, on Zosyn.  Blood cultures from 4/19 with 1/2 with GPC, starting vancomycin    WBC 2.1, Scr wnl, CrCl 93 ml/min, AF   Goal of Therapy:  Vancomycin trough level 15-20 mcg/ml  Plan:  1.) Vancomycin 750 mg IV q8h, continue Zosyn EI 2.Marland Kitchen) Monitor renal function  3.) F/u cultures 4.) Check vancomycin troughs as needed   Abbigale Mcelhaney, Loma Messing PharmD Pager #: [phone] 1:44 PM 02/25/2013

## 2013-02-25 NOTE — Evaluation (Signed)
Physical Therapy Evaluation Patient Details Name: Derrick Crosby MRN: [id number] DOB: Dec 09, 1950 Today's Date: 02/25/2013 Time: 0940-1007 PT Time Calculation (min): 27 min  PT Assessment / Plan / Recommendation Clinical Impression  Pt presents with acute encephalopathy and PNA with history of COPD and lung cancer with mets to brain.  Tolerated OOB and ambulation in hallway very well with RW at min assist level.  Noted that he has not been wearing oxygen and did not wear it during ambulation.  SaO2 following amb was 87% and pt allowed PT to apply 2L O2 with cues for pursed lip breathing.  RN notified.  Pt will benefit from skilled PT in acute venue to address deficits.  PT recommends HHPT for follow up at D/C to maximize pts safety and function.  Wife states that he was already set up with home care.     PT Assessment  Patient needs continued PT services    Follow Up Recommendations  Home health PT;Supervision for mobility/OOB    Does the patient have the potential to tolerate intense rehabilitation      Barriers to Discharge None      Equipment Recommendations  None recommended by PT    Recommendations for Other Services     Frequency Min 3X/week    Precautions / Restrictions Precautions Precautions: Fall Precaution Comments: Note has radiation burn on neck and pain with swallowing.  Restrictions Weight Bearing Restrictions: No   Pertinent Vitals/Pain No pain      Mobility  Bed Mobility Bed Mobility: Supine to Sit Supine to Sit: 5: Supervision;HOB elevated Details for Bed Mobility Assistance: Supervision for safety with min cues for technique.  Transfers Transfers: Sit to Stand;Stand to Sit Sit to Stand: 4: Min guard;With upper extremity assist;From bed Stand to Sit: 4: Min guard;With upper extremity assist;With armrests;To chair/3-in-1 Details for Transfer Assistance: Min/guard for safety and steadying with cues for hand placement and safety when sitting/standing.   Ambulation/Gait Ambulation/Gait Assistance: 4: Min assist;4: Min Government social research officer (Feet): 200 Feet Assistive device: Rolling walker Ambulation/Gait Assistance Details: Cues for maintaining position inside of RW and negotiating around objects in hallway.  Pt is mostly steady with RW and stops if he needs to show something or take a hand off of RW.  Gait Pattern: Step-through pattern;Decreased stride length Gait velocity: decreased Stairs: No Wheelchair Mobility Wheelchair Mobility: No    Exercises     PT Diagnosis: Difficulty walking;Generalized weakness  PT Problem List: Decreased strength;Decreased activity tolerance;Decreased balance;Decreased mobility;Decreased cognition;Decreased knowledge of use of DME;Decreased safety awareness;Decreased knowledge of precautions PT Treatment Interventions: DME instruction;Gait training;Stair training;Functional mobility training;Therapeutic activities;Therapeutic exercise;Balance training;Patient/family education   PT Goals Acute Rehab PT Goals PT Goal Formulation: With patient Time For Goal Achievement: 03/04/13 Potential to Achieve Goals: Good Pt will go Sit to Supine/Side: with modified independence PT Goal: Sit to Supine/Side - Progress: Goal set today Pt will go Sit to Stand: with supervision PT Goal: Sit to Stand - Progress: Goal set today Pt will Ambulate: >150 feet;with supervision;with least restrictive assistive device PT Goal: Ambulate - Progress: Goal set today Pt will Go Up / Down Stairs: 1-2 stairs;with min assist;with least restrictive assistive device PT Goal: Up/Down Stairs - Progress: Goal set today Pt will Perform Home Exercise Program: with supervision, verbal cues required/provided PT Goal: Perform Home Exercise Program - Progress: Goal set today  Visit Information  Last PT Received On: 02/25/13 Assistance Needed: +1    Subjective Data  Subjective: I want to walk back  to bed. I can't believe its 10 'o'clock  already.  Patient Stated Goal: n/a   Prior Functioning  Home Living Lives With: Spouse Available Help at Discharge: Family;Available 24 hours/day Type of Home: Mobile home Home Access: Stairs to enter Entrance Stairs-Number of Steps: 2 Entrance Stairs-Rails: None Home Layout: One level Bathroom Shower/Tub: Tub/shower unit (wife helps him sponge bathe) Bathroom Toilet: Standard Home Adaptive Equipment: Bedside commode/3-in-1;Walker - rolling Additional Comments: Will be getting bedside commode [facility] Prior Function Level of Independence: Needs assistance Needs Assistance: Bathing;Dressing;Toileting;Gait Bath: Minimal Dressing: Minimal Toileting: Minimal Gait Assistance: Pts wife states that she is with him when he is up, but he does most ADL's on his own.  Able to Take Stairs?: Yes Driving: No Vocation: Retired Musician: No difficulties    Copywriter, advertising Arousal/Alertness: Awake/alert Behavior During Therapy: WFL for tasks assessed/performed Overall Cognitive Status: Impaired/Different from baseline Area of Impairment: Orientation;Memory;Attention Orientation Level: Disoriented to;Time;Situation Current Attention Level: Sustained Memory: Decreased short-term memory    Extremity/Trunk Assessment Right Lower Extremity Assessment RLE ROM/Strength/Tone: WFL for tasks assessed RLE Sensation: WFL - Light Touch Left Lower Extremity Assessment LLE ROM/Strength/Tone: WFL for tasks assessed LLE Sensation: WFL - Light Touch Trunk Assessment Trunk Assessment: Normal   Balance    End of Session PT - End of Session Equipment Utilized During Treatment: Gait belt Activity Tolerance: Patient tolerated treatment well Patient left: in chair;with call bell/phone within reach;with family/visitor present Nurse Communication: Mobility status  GP     Vista Deck 02/25/2013, 10:26 AM

## 2013-02-25 NOTE — Progress Notes (Signed)
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  - Continue Mucinex and antitussives.  -stable Convulsions/seizures  - no seizure activity reported  - continue Keppra 500 mg BID  Leukopenia  - due to history of malignancy  - continue to monitor WBC count  Thrombocytopenia  - due to malignancy and sequela of chemotherapy  - platelet count 24 today, from 28/29 -no gross bleeding, follow  Hyponatremia  - likely dehydration  - IV fluids given in ED  PNA (pneumonia)  - continue Zosyn for possible aspiration pneumonia  -blood culture with 1/2 GPC, start empiric vanc, d/clevaquin Probable bacteremia -1/2 blood culture with GPC in clusters -start empiric vanc pending  odynophagia -appreciate ST input -GI consult, Dr Arlyce Dice to see in the am  Code Status: FULL Family Communication: Wife at bedside Disposition Plan: pending clinical course   Consultants:  GI - Dr Arlyce Dice eval pending  Procedures:  none  Antibiotics:  Zosyn started on 4/19  levaquin 4/19>>4/20  vanc >> started 4/20  HPI/Subjective: Sitting in chair, alert and oriented x1, per wife AMS improved. Admits to intermittent pain with swallowing  Objective: Filed Vitals:    02/24/13 1623 02/24/13 2110 02/25/13 0202 02/25/13 0525  BP: 98/67 111/78 108/70 107/71  Pulse: 77 82 59 58  Temp: 98 F (36.7 C) 98 F (36.7 C) 97.4 F (36.3 C) 97.8 F (36.6 C)  TempSrc: Oral Oral Oral Oral  Resp: 18 18 18 18   Weight:      SpO2: 92% 91% 92% 93%    Intake/Output Summary (Last 24 hours) at 02/25/13 1342 Last data filed at 02/25/13 1300  Gross per 24 hour  Intake    100 ml  Output    500 ml  Net   -400 ml   Filed Weights   02/24/13 1347  Weight: 71.3 kg (157 lb 3 oz)    Exam:   General:  Alert and oriented x1, in NAD  Cardiovascular: RRR  Respiratory: bronchial BS and rhonchi in L.lung field  Abdomen: soft +BS NT/ND  Extremities: no edema, no cyanosis   Data Reviewed: Basic Metabolic Panel:  Recent Labs Lab 02/24/13 1140 02/24/13 1510 02/25/13 0531  NA 134* 135 132*  K 3.6 3.6 3.8  CL 96 95* 96  CO2 28 31 29   GLUCOSE 97 83 137*  BUN 21 21 21   CREATININE 0.77 0.81 0.84  CALCIUM 8.9 8.7 8.4  MG  --  1.7  --   PHOS  --  3.9  --    Liver Function Tests:  Recent Labs Lab 02/24/13 1140 02/24/13 1510 02/25/13 0531  AST 27 24 19   ALT 82* 75* 62*  ALKPHOS 96 88 78  BILITOT 0.6 0.7 0.4  PROT 5.9* 5.6* 5.1*  ALBUMIN 1.9*  1.9* 1.7*   No results found for this basename: LIPASE, AMYLASE,  in the last 168 hours No results found for this basename: AMMONIA,  in the last 168 hours CBC:  Recent Labs Lab 02/24/13 1140 02/24/13 1510 02/25/13 0531  WBC 3.0* 2.3* 2.1*  NEUTROABS 2.6 1.9  --   HGB 13.1 12.6* 11.4*  HCT 37.3* 36.3* 32.6*  MCV 88.2 88.3 87.6  PLT 29* 28* 24*   Cardiac Enzymes: No results found for this basename: CKTOTAL, CKMB, CKMBINDEX, TROPONINI,  in the last 168 hours BNP (last 3 results) No results found for this basename: PROBNP,  in the last 8760 hours CBG:  Recent Labs Lab 02/25/13 0742  GLUCAP 122*    Recent Results (from the past 240 hour(s))  CULTURE, BLOOD (ROUTINE X 2)     Status: None    Collection Time    02/24/13  1:20 PM      Result Value Range Status   Specimen Description BLOOD LEFT ARM   Final   Special Requests BOTTLES DRAWN AEROBIC ONLY 2.5 CC    Final   Culture  Setup Time 02/24/2013 17:47   Final   Culture     Final   Value: GRAM POSITIVE COCCI IN CLUSTERS     Note: Gram Stain Report Called to,Read Back By and Verified With: HEATHER BULLINS 02/25/13 @ 12:38PM BY RUSCA.   Report Status PENDING   Incomplete  CULTURE, BLOOD (ROUTINE X 2)     Status: None   Collection Time    02/24/13  1:26 PM      Result Value Range Status   Specimen Description BLOOD RIGHT ANTECUBITAL   Final   Special Requests     Final   Value: BOTTLES DRAWN AEROBIC AND ANAEROBIC 4 CC BLUE, 3 CC RED   Culture  Setup Time 02/24/2013 17:48   Final   Culture     Final   Value:        BLOOD CULTURE RECEIVED NO GROWTH TO DATE CULTURE WILL BE HELD FOR 5 DAYS BEFORE ISSUING A FINAL NEGATIVE REPORT   Report Status PENDING   Incomplete  MRSA PCR SCREENING     Status: None   Collection Time    02/24/13  5:45 PM      Result Value Range Status   MRSA by PCR NEGATIVE  NEGATIVE Final   Comment:            The GeneXpert MRSA Assay (FDA     approved for NASAL specimens     only), is one component of a     comprehensive MRSA colonization     surveillance program. It is not     intended to diagnose MRSA     infection nor to guide or     monitor treatment for     MRSA infections.     Studies: Dg Chest 2 View  02/24/2013  *RADIOLOGY REPORT*  Clinical Data: Cough.  History of lung cancer.  CHEST - 2 VIEW  Comparison: Chest CT 11/27/2012  Findings: Airspace opacity noted in the left upper lobe.  It is unclear if this could represent postradiation changes or area of infection/pneumonia.  No confluent opacity on the right.  Right Port-A-Cath remains in place, unchanged.  Heart is normal size.  No visible effusions.  IMPRESSION: Left upper lobe airspace opacity.  Recommend correlation for any interval  radiation to this area has this could represent radiation pneumonitis.  Cannot exclude pneumonia.   Original  Report Authenticated By: Charlett Nose, M.D.    Ct Head Wo Contrast  02/24/2013  *RADIOLOGY REPORT*  Clinical Data: Confusion.  Metastatic lung cancer.  CT HEAD WITHOUT CONTRAST  Technique:  Contiguous axial images were obtained from the base of the skull through the vertex without contrast.  Comparison: CT and MRI 01/22/2013  Findings: The previously seen left frontal lesion appears improved with near complete resolution of the left frontal edema.  No visible mass currently on this unenhanced CT.  No hemorrhage, hydrocephalus or acute infarction.  Small air-fluid levels in the maxillary sinuses.  Diffuse ethmoid mucosal thickening.  Mastoids are clear.  IMPRESSION: Significant improvement in the left frontal lesion and surrounding edema.  No acute infarction or hemorrhage.   Original Report Authenticated By: Charlett Nose, M.D.     Scheduled Meds: . amitriptyline  25 mg Oral QHS  . aspirin EC  81 mg Oral Daily  . chlorpheniramine-HYDROcodone  5 mL Oral Q12H  . citalopram  20 mg Oral QPM  . dexamethasone  4 mg Intravenous Q6H  . feeding supplement  1 Container Oral TID BM  . gabapentin  600 mg Oral TID  . guaiFENesin  1,200 mg Oral BID  . levETIRAcetam  500 mg Oral BID  . loratadine  10 mg Oral Daily  . omega-3 acid ethyl esters  1 g Oral Daily  . piperacillin-tazobactam (ZOSYN)  IV  3.375 g Intravenous Q8H  . polyethylene glycol  17 g Oral Daily  . sucralfate  1 g Oral QID  . triamcinolone cream  1 application Topical BID   Continuous Infusions: . 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

## 2013-02-26 ENCOUNTER — Encounter (HOSPITAL_COMMUNITY)
Admission: EM | Disposition: A | Discharge status: Home Health | Payer: Self-pay | Source: Home / Self Care | Attending: Internal Medicine

## 2013-02-26 DIAGNOSIS — D696 Thrombocytopenia, unspecified: Secondary | ICD-10-CM

## 2013-02-26 DIAGNOSIS — R1310 Dysphagia, unspecified: Secondary | ICD-10-CM | POA: Diagnosis present

## 2013-02-26 DIAGNOSIS — E871 Hypo-osmolality and hyponatremia: Secondary | ICD-10-CM

## 2013-02-26 LAB — BASIC METABOLIC PANEL
BUN: 23 mg/dL (ref 6–23)
CO2: 29 mEq/L (ref 19–32)
Chloride: 99 mEq/L (ref 96–112)
Creatinine, Ser: 0.78 mg/dL (ref 0.50–1.35)
GFR calc Af Amer: >90 mL/min (ref >90)
Potassium: 3.8 mEq/L (ref 3.5–5.1)

## 2013-02-26 LAB — CBC
HCT: 32.2 % — ABNORMAL LOW (ref 39.0–52.0)
MCV: 87.7 fL (ref 78.0–100.0)
Platelets: 26 10*3/uL — CL (ref 150–400)
RBC: 3.67 MIL/uL — ABNORMAL LOW (ref 4.22–5.81)
WBC: 2.9 10*3/uL — ABNORMAL LOW (ref 4.0–10.5)

## 2013-02-26 SURGERY — ESOPHAGOGASTRODUODENOSCOPY (EGD) WITH ESOPHAGEAL DILATION
Anesthesia: Moderate Sedation

## 2013-02-26 MED ORDER — SUCRALFATE 1 GM/10ML PO SUSP
1.0000 g | Freq: Four times a day (QID) | ORAL | Status: DC
  Administered 2013-02-26 – 2013-03-02 (×12): 1 g via ORAL
  Filled 2013-02-26 (×21): qty 10

## 2013-02-26 MED ORDER — DEXAMETHASONE SODIUM PHOSPHATE 4 MG/ML IJ SOLN
4.0000 mg | Freq: Three times a day (TID) | INTRAMUSCULAR | Status: DC
  Administered 2013-02-27 (×3): 4 mg via INTRAVENOUS
  Filled 2013-02-26 (×4): qty 1

## 2013-02-26 MED ORDER — PANTOPRAZOLE SODIUM 40 MG PO TBEC
40.0000 mg | DELAYED_RELEASE_TABLET | Freq: Two times a day (BID) | ORAL | Status: DC
  Administered 2013-02-26 – 2013-03-02 (×9): 40 mg via ORAL
  Filled 2013-02-26 (×9): qty 1

## 2013-02-26 MED ORDER — SODIUM CHLORIDE 0.9 % IV SOLN
INTRAVENOUS | Status: DC
  Administered 2013-02-27 – 2013-03-01 (×3): via INTRAVENOUS

## 2013-02-26 NOTE — Progress Notes (Signed)
Physical Therapy Treatment Patient Details Name: Derrick Crosby MRN: [id number] DOB: 21-Jul-1951 Today's Date: 02/26/2013 Time: 2956-2130 PT Time Calculation (min): 30 min  PT Assessment / Plan / Recommendation Comments on Treatment Session  Able to increase ambulation distance this session. Recommend HHPT.     Follow Up Recommendations  Home health PT;Supervision for mobility/OOB     Does the patient have the potential to tolerate intense rehabilitation     Barriers to Discharge        Equipment Recommendations  None recommended by PT    Recommendations for Other Services    Frequency Min 3X/week   Plan Discharge plan remains appropriate    Precautions / Restrictions Precautions Precautions: Fall Restrictions Weight Bearing Restrictions: No   Pertinent Vitals/Pain No c/o pain    Mobility  Bed Mobility Bed Mobility: Sit to Supine Supine to Sit: 4: Min guard Transfers Transfers: Sit to Stand;Stand to Sit Sit to Stand: 4: Min guard;From chair/3-in-1;From toilet Stand to Sit: 4: Min guard;To bed;To toilet Ambulation/Gait Ambulation/Gait Assistance: 4: Min guard;4: Min Environmental consultant (Feet): 325 Feet Assistive device: Rolling walker Ambulation/Gait Assistance Details: Assist to stabilize while turning. slow gait speed.  Gait Pattern: Step-through pattern;Decreased stride length Stairs: No    Exercises     PT Diagnosis:    PT Problem List:   PT Treatment Interventions:     PT Goals Acute Rehab PT Goals Pt will go Sit to Stand: with supervision PT Goal: Sit to Stand - Progress: Progressing toward goal Pt will Ambulate: >150 feet;with supervision;with least restrictive assistive device PT Goal: Ambulate - Progress: Progressing toward goal  Visit Information  Last PT Received On: 02/26/13 Assistance Needed: +1    Subjective Data  Subjective: I'll be alright once I get going   Cognition  Cognition Arousal/Alertness: Awake/alert Memory:  Decreased short-term memory    Balance     End of Session PT - End of Session Equipment Utilized During Treatment: Gait belt Activity Tolerance: Patient tolerated treatment well Patient left: in bed;with call bell/phone within reach;with family/visitor present   GP     Rebeca Alert, MPT Pager: [phone]

## 2013-02-26 NOTE — Consult Note (Signed)
[facility] Consult: 9:21 AM 02/26/2013   Referring Provider: Dr Sharon Seller Primary Care Physician:  [facility] Primary Gastroenterologist:  none   Reason for Consultation:  Odynophagia  HPI: Derrick Crosby is a 62 y.o. male.  Diagnosed with squamous cell lung cancer in July 2013.  Underwent 15 radiation sessions and chemo.  Brain mets discovered in 01/2013 when he presented with UE limb paralysis and has undergone radiation to this lesion.  Yesterdays brain imaging shows regression of the brain tumor.  Due to have repeat lung CT on 4/23.  Pt admitted from ED yesterday with altered mental status, urinary incontinence that has since resolved.    Many months of dyphagia/odynophagia since starting therapies last summer/fall.  However problems worst in past few weeks.  Able to get and keep down soft foods and liquids.  No weight loss.  Some sores in mouth.  Radiation burns to both sides of head near ears and into left neck.  Has Porta cath in place.     Never had an EGD.  At home has been taking Carafate qid and Magic mouthwash qid to no avail.  Unfortunately platelets are in the 20s and WBCs severely reduced as well.    Bedside SLP eval cleared him for D2 and thin liquids.   Labs indicate protein malnutrition.   Past Medical History  Diagnosis Date  . Hypertension   . Hyperglyceridemia, pure   . COPD (chronic obstructive pulmonary disease)   . Hemoptysis   . Anxiety   . Depression   . Hemoptysis   . Seasonal allergies   . History of radiation therapy 07/05/12-07/26/12    lung ca   . Cancer     Skin  Cancer - Left ear.-  . Skin cancer     left ear  . Lung cancer 06/13/12    lung mass /right hilar suspicious for primary bronchogenic ca  . Metastasis to brain 01/2013    lung primary    Past Surgical History  Procedure Laterality Date  . Tympanoplasty    . Video bronchoscopy  06/13/12    with  endobronchial u/s/ with biospy left hilar mass/Dr. Lonia Farber  . Tonsillectomy      child    Prior to Admission medications   Medication Sig Start Date End Date Taking? Authorizing Provider  acetaminophen (TYLENOL) 325 MG tablet Take 650 mg by mouth every 6 (six) hours as needed. For general aches   Yes Historical Provider, MD  albuterol (PROVENTIL HFA;VENTOLIN HFA) 108 (90 BASE) MCG/ACT inhaler Inhale 2 puffs into the lungs every 6 (six) hours as needed for wheezing.    Yes Historical Provider, MD  alum & mag hydroxide-simeth (MAALOX/MYLANTA) 200-200-20 MG/5ML suspension Take 30 mLs by mouth every 6 (six) hours as needed. 01/29/13  Yes Alison Murray, MD  Alum & Mag Hydroxide-Simeth (MAGIC MOUTHWASH W/LIDOCAINE) SOLN Take 5 mLs by mouth 4 (four) times daily as needed. 01/29/13  Yes Alison Murray, MD  amitriptyline (ELAVIL) 25 MG tablet Take 25 mg by mouth at bedtime.   Yes Historical Provider, MD  aspirin EC 81 MG tablet Take 81 mg by mouth daily.   Yes Historical Provider, MD  chlorproMAZINE (THORAZINE) 10 MG tablet Take 1 tablet (10 mg total) by mouth 4 (four) times daily as needed. 01/29/13  Yes Alison Murray, MD  citalopram (CELEXA) 20 MG tablet Take 1 tablet (20 mg total) by mouth every evening. 01/29/13  Yes Alison Murray, MD  clonazePAM (KLONOPIN) 0.5 MG tablet Take 1 tablet (0.5 mg total) by mouth 2 (two) times daily as needed for anxiety. For anxiety 01/29/13  Yes Alison Murray, MD  dexamethasone (DECADRON) 4 MG tablet Take 1 tablet (4 mg total) by mouth every 6 (six) hours. 01/29/13  Yes Alison Murray, MD  gabapentin (NEURONTIN) 300 MG capsule Take 600 mg by mouth 3 (three) times daily.   Yes Historical Provider, MD  guaiFENesin (MUCINEX) 600 MG 12 hr tablet Take 600 mg by mouth 2 (two) times daily.   Yes Historical Provider, MD  HYDROcodone-homatropine (HYCODAN) 5-1.5 MG/5ML syrup Take 5 mLs by mouth every 6 (six) hours as needed for cough. 01/29/13  Yes Alison Murray, MD   levETIRAcetam (KEPPRA) 100 MG/ML solution Take 5 mLs (500 mg total) by mouth 2 (two) times daily. 01/29/13  Yes Alison Murray, MD  lidocaine-prilocaine (EMLA) cream Apply 1 application topically as needed. Apply to [facility] A cath site 30-60 min before chemotherapy. Used every Wednesday 06/27/12 06/27/13 Yes [name], MD  loratadine (CLARITIN) 10 MG tablet Take 10 mg by mouth daily.   Yes Historical Provider, MD  omega-3 acid ethyl esters (LOVAZA) 1 G capsule Take 1 capsule (1 g total) by mouth daily. 01/29/13  Yes Alison Murray, MD  polyethylene glycol [facility] / Ethelene Hal) packet Take 17 g by mouth at bedtime.    Yes Historical Provider, MD  sucralfate (CARAFATE) 1 G tablet Take 1 g by mouth 4 (four) times daily. Dissolve in 15 CC of water   Yes Historical Provider, MD  triamcinolone cream (KENALOG) 0.1 % Apply 1 application topically 2 (two) times daily. Apply to nose and face for dry skin   Yes Historical Provider, MD    Scheduled Meds: . amitriptyline  25 mg Oral QHS  . aspirin EC  81 mg Oral Daily  . chlorpheniramine-HYDROcodone  5 mL Oral Q12H  . citalopram  20 mg Oral QPM  . dexamethasone  4 mg Intravenous Q6H  . feeding supplement  1 Container Oral TID BM  . gabapentin  600 mg Oral TID  . guaiFENesin  1,200 mg Oral BID  . levETIRAcetam  500 mg Oral BID  . loratadine  10 mg Oral Daily  . omega-3 acid ethyl esters  1 g Oral Daily  . pantoprazole  40 mg Oral BID  . piperacillin-tazobactam (ZOSYN)  IV  3.375 g Intravenous Q8H  . polyethylene glycol  17 g Oral Daily  . sucralfate  1 g Oral Q6H  . triamcinolone cream  1 application Topical BID  . vancomycin  750 mg Intravenous Q8H   Infusions: . sodium chloride 75 mL/hr at 02/25/13 2327   PRN Meds: acetaminophen, albuterol, albuterol, alum & mag hydroxide-simeth, chlorproMAZINE, clonazePAM, ipratropium, magic mouthwash w/lidocaine, morphine injection, ondansetron (ZOFRAN) IV, ondansetron, sodium chloride   Allergies as of  02/24/2013  . (No Known Allergies)    Family History  Problem Relation Age of Onset  . Lung cancer Mother   . Cancer Mother     lung former smoker, age 67 now    History   Social History  . Marital Status: Married    Spouse Name: [name]    Number of Children: 1  . Years of Education: N/A   Occupational History  . works in [employer] and [employer]  .     Social History Main Topics  . Smoking status: Former Smoker -- 0.50 packs/day for 50 years  Types: Cigarettes    Quit date: 06/14/2012  . Smokeless tobacco: Never Used     Comment: pt trying to quit  . Alcohol Use: No     Comment: occasionally beer   . Drug Use: No  . Sexually Active: No   Other Topics Concern  . Not on file   Social History Narrative   Married to [place].  Normally walks without assistance.    REVIEW OF SYSTEMS: + clear productive cough. No ETOH Quit cigs in July 2013 but heavy smoker before that.  + urine hesitancy. + DOE No palpitations or chest pain Daily Miralax controls constipation No NSAIDs, ASA products.  No foot or hand swelling  No large bruises or vigorous bleeding Transfused in past 6 months with PRBC   PHYSICAL EXAM: Vital signs in last 24 hours: Temp:  [97.3 F (36.3 C)-98.8 F (37.1 C)] 97.5 F (36.4 C) (04/21 0600) Pulse Rate:  [54-77] 54 (04/21 0600) Resp:  [18] 18 (04/21 0600) BP: (106-134)/(66-84) 130/80 mmHg (04/21 0600) SpO2:  [93 %-94 %] 93 % (04/21 0600) Weight:  [73.6 kg (162 lb 4.1 oz)] 73.6 kg (162 lb 4.1 oz) (04/20 1434)  General: looks unwell, not toxic.  Head:  Symmetric, no edema.  Radiation burns to side of head bil and to left neck  Eyes:  No icterus. Ears:  Radiation burns in both ears.  Good hearing.  Nose:  No discharge Mouth:  Poor dentition, no sores or exudates.  Moisture level WNL. Neck:  Radiation burn on left, partly covered with occlusive dressing. Lungs:  Clear B. No cough or labored breathing Heart: RRR Abdomen:  Soft,  NT, ND.  No mass or HSM.   Rectal: deferred   Musc/Skeltl: no joint swelling or deformities Extremities:  No pedal edema  Neurologic:  Pleasant, drowsy but arousable.  No confusion.  Skin:  Dry and falking skin on face, head, radiation burns on bothe ears and left neck Tattoos:  None seen Nodes:  No cervical adenopathy   Psych:  Pleasant, not anxious or agitated.   Intake/Output from previous day: 04/20 0701 - 04/21 0700 In: 2200 [P.O.:240; I.V.:1360; IV Piggyback:600] Out: 900 [Urine:900] Intake/Output this shift:    LAB RESULTS:  Recent Labs  02/24/13 1510 02/25/13 0531 02/26/13 0500  WBC 2.3* 2.1* 2.9*  HGB 12.6* 11.4* 11.4*  HCT 36.3* 32.6* 32.2*  PLT 28* 24* 26*   BMET Lab Results  Component Value Date   NA 134* 02/26/2013   NA 132* 02/25/2013   NA 135 02/24/2013   K 3.8 02/26/2013   K 3.8 02/25/2013   K 3.6 02/24/2013   CL 99 02/26/2013   CL 96 02/25/2013   CL 95* 02/24/2013   CO2 29 02/26/2013   CO2 29 02/25/2013   CO2 31 02/24/2013   GLUCOSE 132* 02/26/2013   GLUCOSE 137* 02/25/2013   GLUCOSE 83 02/24/2013   BUN 23 02/26/2013   BUN 21 02/25/2013   BUN 21 02/24/2013   CREATININE 0.78 02/26/2013   CREATININE 0.84 02/25/2013   CREATININE 0.81 02/24/2013   CALCIUM 8.4 02/26/2013   CALCIUM 8.4 02/25/2013   CALCIUM 8.7 02/24/2013   LFT  Recent Labs  02/24/13 1140 02/24/13 1510 02/25/13 0531  PROT 5.9* 5.6* 5.1*  ALBUMIN 1.9* 1.9* 1.7*  AST 27 24 19   ALT 82* 75* 62*  ALKPHOS 96 88 78  BILITOT 0.6 0.7 0.4   PT/INR Lab Results  Component Value Date   INR 1.07 02/24/2013   INR  1.00 06/13/2012     RADIOLOGY STUDIES: Dg Chest 2 View 02/24/2013  IMPRESSION: Left upper lobe airspace opacity.  Recommend correlation for any interval radiation to this area has this could represent radiation pneumonitis.  Cannot exclude pneumonia.   Original Report Authenticated By: Charlett Nose, M.D.    Ct Head Wo Contrast 02/24/2013  .  IMPRESSION: Significant improvement in the left  frontal lesion and surrounding edema.  No acute infarction or hemorrhage.   Original Report Authenticated By: Charlett Nose, M.D.    Mr Laqueta Jean Wo Contrast 02/25/2013    IMPRESSION: 1.  Regression of solitary left superior frontal gyrus brain metastasis.  Significantly reduced edema and no significant mass effect at this time. 2.  No additional metastatic disease or new intracranial abnormality identified.   Original Report Authenticated By: Erskine Speed, M.D.     ENDOSCOPIC STUDIES: None ever.   IMPRESSION: *  Odynophagia/Dysphagia for several months corresponding with initiation of radiation to chest for treatment of lung cancer late summer of 2013.  Rule out radiation esophagitis, rule out chest lesion with mass effect, though this would not cause the odynophagia. Rule out viral or fungal esophagitis.  Continues on empiric BID po Protonix and Carafate *  Lung cancer with brain mets.  Radiation to lung followed by Chemo, completed chemo Jan 2013.  Subsequent radiation to brain mets 01/2013.  Yesterday's head imaging shows regression of brain lesion.  *  Thrombocytopenia, leukopenia.  *  Hypoalbuminemia, suspect  Protein malnutrition in pt with poor appetite.  *  Elevated ALT.   *  Rule out PNA, possibly due to aspiration.  On empiric Vanco, Zosyn.   PLAN: *  Unable to perform EGD due to thrombocytopenia.  Discussed with pt, spouse and MD. If/when platelets approach 50K, can then pursue EGD.  *  Allow D 2 diet. Dietary will provide Mighty Shakes, as pt does not tolerate Ensure, though used to tolerate chocolate ensure.  *  Consider esophagram, "regular" barium swallow to assess.  *  ? Imaging of liver with CT along with chest CT to assess for liver mets? *  Check prealbumin and add Ensure.     LOS: 2 days   Jennye Moccasin  02/26/2013, 9:21 AM Pager: [phone]

## 2013-02-26 NOTE — Progress Notes (Signed)
[facility] PROGRESS NOTE  TRASHAUN STREIGHT ZOX:[id number] DOB: Nov 06, 1951 DOA: 02/24/2013 PCP: Arlyss Queen  Assessment/Plan:  Acute encephalopathy  - more likely secondary PN since the left frontal brain lesion  and surrounding edema have improved  Per CT, MRI also shows regression of frontal lobe lesion with significant decrease in edema -empiric abx for PNA as below - discussed pt with Dr Arbutus Ped and he agrees encephalopathy more likle secondary to PNA  And to taper  decadron and change back to outpt dose once tolerating PO. 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  - Continue Mucinex and antitussives.  -stable Convulsions/seizures  - no seizure activity reported  - continue Keppra 500 mg BID  Leukopenia  - due to history of malignancy  - continue to monitor WBC count  Thrombocytopenia  - last chemo was in Jan and plt count in 01/2013 was 200 - platelet count 26 today, trending up, but on review of records-last plt count in 01/2013 was 200 - radiation which pt has been receiving, and infection all contributing factors. -avoiding antiplatelet agents -treating infection as above -no gross bleeding, follow  Hyponatremia  - likely dehydration  - IV fluids given in ED  PNA (pneumonia)  -continue Zosyn for possible aspiration pneumonia  -blood culture with 1/2 GPC, continue vanc pending culture ID -levaquin dc'ed on 4/20 Probable bacteremia -1/2 blood culture with GPC in clusters, awaiting ID and sens -Continue empiric vanc pending ID and sens odynophagia -appreciate ST input -seen by GI consult - Dr Arlyce Dice  this am, EGD held off due to thrombocytopenia -discussed platelet transfusion with  Code Status: FULL Family Communication: Wife at bedside Disposition Plan: pending clinical course   Consultants:  GI - Dr Arlyce Dice eval pending  Procedures:  none  Antibiotics:  Zosyn started on 4/19  levaquin 4/19>>4/20  vanc >> started 4/20  HPI/Subjective: Sitting in chair,attempting to eat - has odynophagia to both solids and liquids but states worse with solids  Objective: Filed Vitals:   02/25/13 1433 02/25/13 1434 02/25/13 2200 02/26/13 0600  BP: 123/77 134/84 106/66 130/80  Pulse: 73 77 71 54  Temp: 97.3 F (36.3 C) 98.8 F (37.1 C) 97.5 F (36.4 C) 97.5 F (36.4 C)  TempSrc: Oral Oral Oral Oral  Resp: 18  18 18   Height:  6\' 2"  (1.88 m)    Weight:  73.6 kg (162 lb 4.1 oz)    SpO2: 93% 94% 93% 93%    Intake/Output Summary (Last 24 hours) at 02/26/13 1107 Last data filed at 02/26/13 0526  Gross per 24 hour  Intake   2200 ml  Output    900 ml  Net   1300 ml   Filed Weights   02/24/13 1347 02/25/13 1434  Weight: 71.3 kg (157 lb 3 oz) 73.6 kg (162 lb 4.1 oz)    Exam:   General:  Alert and oriented x1, in NAD  Cardiovascular: RRR  Respiratory: bronchial BS and rhonchi in L.lung field  Abdomen: soft +BS NT/ND  Extremities: no edema, no cyanosis   Data Reviewed: Basic Metabolic Panel:  Recent Labs Lab 02/24/13 1140 02/24/13 1510 02/25/13 0531 02/26/13  0500  NA 134* 135 132* 134*  K 3.6 3.6 3.8 3.8  CL 96 95* 96 99  CO2 28 31 29 29   GLUCOSE 97 83 137* 132*  BUN 21 21 21 23   CREATININE 0.77 0.81 0.84 0.78  CALCIUM 8.9 8.7 8.4 8.4  MG  --  1.7  --   --   PHOS  --  3.9  --   --    Liver Function Tests:  Recent Labs Lab 02/24/13 1140 02/24/13 1510 02/25/13 0531  AST 27 24 19   ALT 82* 75* 62*  ALKPHOS 96 88 78  BILITOT 0.6 0.7 0.4  PROT 5.9* 5.6* 5.1*  ALBUMIN 1.9* 1.9* 1.7*   No results found for this basename: LIPASE, AMYLASE,  in the  last 168 hours No results found for this basename: AMMONIA,  in the last 168 hours CBC:  Recent Labs Lab 02/24/13 1140 02/24/13 1510 02/25/13 0531 02/26/13 0500  WBC 3.0* 2.3* 2.1* 2.9*  NEUTROABS 2.6 1.9  --   --   HGB 13.1 12.6* 11.4* 11.4*  HCT 37.3* 36.3* 32.6* 32.2*  MCV 88.2 88.3 87.6 87.7  PLT 29* 28* 24* 26*   Cardiac Enzymes: No results found for this basename: CKTOTAL, CKMB, CKMBINDEX, TROPONINI,  in the last 168 hours BNP (last 3 results) No results found for this basename: PROBNP,  in the last 8760 hours CBG:  Recent Labs Lab 02/25/13 0742  GLUCAP 122*    Recent Results (from the past 240 hour(s))  CULTURE, BLOOD (ROUTINE X 2)     Status: None   Collection Time    02/24/13  1:20 PM      Result Value Range Status   Specimen Description BLOOD LEFT ARM   Final   Special Requests BOTTLES DRAWN AEROBIC ONLY 2.5 CC    Final   Culture  Setup Time 02/24/2013 17:47   Final   Culture     Final   Value: GRAM POSITIVE COCCI IN CLUSTERS     Note: Gram Stain Report Called to,Read Back By and Verified With: HEATHER BULLINS 02/25/13 @ 12:38PM BY RUSCA.   Report Status PENDING   Incomplete  CULTURE, BLOOD (ROUTINE X 2)     Status: None   Collection Time    02/24/13  1:26 PM      Result Value Range Status   Specimen Description BLOOD RIGHT ANTECUBITAL   Final   Special Requests     Final   Value: BOTTLES DRAWN AEROBIC AND ANAEROBIC 4 CC BLUE, 3 CC RED   Culture  Setup Time 02/24/2013 17:48   Final   Culture     Final   Value:        BLOOD CULTURE RECEIVED NO GROWTH TO DATE CULTURE WILL BE HELD FOR 5 DAYS BEFORE ISSUING A FINAL NEGATIVE REPORT   Report Status PENDING   Incomplete  MRSA PCR SCREENING     Status: None   Collection Time    02/24/13  5:45 PM      Result Value Range Status   MRSA by PCR NEGATIVE  NEGATIVE Final   Comment:            The GeneXpert MRSA Assay (FDA     approved for NASAL specimens     only), is one component of a     comprehensive MRSA  colonization     surveillance program. It is not     intended to diagnose MRSA     infection nor  to guide or     monitor treatment for     MRSA infections.     Studies: Dg Chest 2 View  02/24/2013  *RADIOLOGY REPORT*  Clinical Data: Cough.  History of lung cancer.  CHEST - 2 VIEW  Comparison: Chest CT 11/27/2012  Findings: Airspace opacity noted in the left upper lobe.  It is unclear if this could represent postradiation changes or area of infection/pneumonia.  No confluent opacity on the right.  Right Port-A-Cath remains in place, unchanged.  Heart is normal size.  No visible effusions.  IMPRESSION: Left upper lobe airspace opacity.  Recommend correlation for any interval radiation to this area has this could represent radiation pneumonitis.  Cannot exclude pneumonia.   Original Report Authenticated By: Charlett Nose, M.D.    Ct Head Wo Contrast  02/24/2013  *RADIOLOGY REPORT*  Clinical Data: Confusion.  Metastatic lung cancer.  CT HEAD WITHOUT CONTRAST  Technique:  Contiguous axial images were obtained from the base of the skull through the vertex without contrast.  Comparison: CT and MRI 01/22/2013  Findings: The previously seen left frontal lesion appears improved with near complete resolution of the left frontal edema.  No visible mass currently on this unenhanced CT.  No hemorrhage, hydrocephalus or acute infarction.  Small air-fluid levels in the maxillary sinuses.  Diffuse ethmoid mucosal thickening.  Mastoids are clear.  IMPRESSION: Significant improvement in the left frontal lesion and surrounding edema.  No acute infarction or hemorrhage.   Original Report Authenticated By: Charlett Nose, M.D.    Mr Laqueta Jean Wo Contrast  02/25/2013  *RADIOLOGY REPORT*  Clinical Data: 62 year old male with metastatic lung cancer. Solitary brain metastasis detected on 01/22/2013.  Confusion. Restaging.  MRI HEAD WITHOUT AND WITH CONTRAST  Technique:  Multiplanar, multiecho pulse sequences of the brain and  surrounding structures were obtained according to standard protocol without and with intravenous contrast  Contrast: 14mL MULTIHANCE GADOBENATE DIMEGLUMINE 529 MG/ML IV SOLN  Comparison: 01/22/2013 and earlier.  Findings: Regressed left superior frontal gyrus enhancing metastasis, enhancing component now measuring up to 16 x 16 mm transaxially, previously 32 x 22 mm.  No additional brain parenchymal metastasis identified.  Significantly reduced edema surrounding the known metastasis. Stable gray and white matter signal elsewhere.  Currently no significant mass effect. No restricted diffusion to suggest acute infarction.  Major intracranial vascular flow voids are stable. No acute intracranial hemorrhage identified.  Negative pituitary, cervicomedullary junction, and visible cervical spine.  The normal bone marrow signal.  Visualized orbit soft tissues are within normal limits.  Increased paranasal sinus mucosal thickening and fluid levels.  Small left mastoid effusion is new.  Negative nasopharynx.  Negative scalp soft tissues.  IMPRESSION: 1.  Regression of solitary left superior frontal gyrus brain metastasis.  Significantly reduced edema and no significant mass effect at this time. 2.  No additional metastatic disease or new intracranial abnormality identified.   Original Report Authenticated By: Erskine Speed, M.D.     Scheduled Meds: . amitriptyline  25 mg Oral QHS  . aspirin EC  81 mg Oral Daily  . chlorpheniramine-HYDROcodone  5 mL Oral Q12H  . citalopram  20 mg Oral QPM  . dexamethasone  4 mg Intravenous Q6H  . feeding supplement  1 Container Oral TID BM  . gabapentin  600 mg Oral TID  . guaiFENesin  1,200 mg Oral BID  . levETIRAcetam  500 mg Oral BID  . loratadine  10 mg Oral Daily  . omega-3 acid ethyl esters  1 g Oral Daily  . pantoprazole  40 mg Oral BID  . piperacillin-tazobactam (ZOSYN)  IV  3.375 g Intravenous Q8H  . polyethylene glycol  17 g Oral Daily  . sucralfate  1 g Oral Q6H  .  triamcinolone cream  1 application Topical BID  . vancomycin  750 mg Intravenous Q8H   Continuous Infusions: . sodium chloride 75 mL/hr at 02/25/13 2327  . 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

## 2013-02-26 NOTE — Consult Note (Addendum)
Patient was seen and examined. History reviewed. He's been complaining of odynophagia and dysphagia since he started radiation and chemotherapy therapy.  Both symptoms have been worsening recently. 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 He may also have an esophageal stricture. Candida esophagitis remains a possibility as well.  Recommendations #1 twice a day PPI therapy #2 Carafate slurry #3 upper endoscopy when platelet counts exceed 50,000

## 2013-02-26 NOTE — Progress Notes (Signed)
Speech Language Pathology Dysphagia Treatment Patient Details Name: Derrick Crosby MRN: [id number] DOB: 1950/12/05 Today's Date: 02/26/2013 Time: 0850-0908 SLP Time Calculation (min): 18 min  Assessment / Plan / Recommendation Clinical Impression  Pt seen for education re: strategies to compensate for dysphagia and mitigation of aspiration risk.  Appreciate GI referral and plan.  Pt's spouse reports use of carafate at home is helpful.  Odynophagia worse now since recent radiation per spouse.  Pt reports improved tolerance of liquids over solids.  Pt ate 100% of clear liquid diet last night with food tolerance if he "took it slow."  Dys2 diet was recommended by SLP during last hospital admission due to dentition and symptoms of multifactorial dysphagia.  SLP to follow up later this week to see if can offer further education for dysphagia mitigation.      Diet Recommendation  Continue with Current Diet:  (clears pending GI EGD due to pt ease of swallowing)    SLP Plan Continue with current plan of care   Pertinent Vitals/Pain Afebrile, decreased   Swallowing Goals  SLP Swallowing Goals Swallow Study Goal #2 - Progress: Progressing toward goal  General Temperature Spikes Noted: No Respiratory Status: Room air Behavior/Cognition: Alert;Pleasant mood;Cooperative Oral Cavity - Dentition: Poor condition;Missing dentition (pt has 3 lower teeth only) Patient Positioning: Partially reclined  Oral Cavity - Oral Hygiene     Dysphagia Treatment Treatment focused on: Patient/family/caregiver education Family/Caregiver Educated: spouse Patient observed directly with PO's: No Reason PO's not observed: Other (comment) (pt later during session was to be npo for egd) Feeding: Able to feed self   GO     Donavan Burnet, MS [facility] SLP [phone]

## 2013-02-27 ENCOUNTER — Inpatient Hospital Stay (HOSPITAL_COMMUNITY): Payer: BC Managed Care – PPO

## 2013-02-27 LAB — BASIC METABOLIC PANEL
BUN: 18 mg/dL (ref 6–23)
CO2: 25 mEq/L (ref 19–32)
Calcium: 8 mg/dL — ABNORMAL LOW (ref 8.4–10.5)
Chloride: 102 mEq/L (ref 96–112)
Creatinine, Ser: 0.75 mg/dL (ref 0.50–1.35)
GFR calc Af Amer: >90 mL/min (ref >90)
GFR calc non Af Amer: >90 mL/min (ref >90)
Glucose, Bld: 115 mg/dL — ABNORMAL HIGH (ref 70–99)
Potassium: 3.4 mEq/L — ABNORMAL LOW (ref 3.5–5.1)
Sodium: 135 mEq/L (ref 135–145)

## 2013-02-27 LAB — CBC
HCT: 29.1 % — ABNORMAL LOW (ref 39.0–52.0)
Hemoglobin: 10.2 g/dL — ABNORMAL LOW (ref 13.0–17.0)
MCH: 30.7 pg (ref 26.0–34.0)
MCHC: 35.1 g/dL (ref 30.0–36.0)
MCV: 87.7 fL (ref 78.0–100.0)
Platelets: 29 10*3/uL — CL (ref 150–400)
RBC: 3.32 MIL/uL — ABNORMAL LOW (ref 4.22–5.81)
RDW: 13.8 % (ref 11.5–15.5)
WBC: 3.3 10*3/uL — ABNORMAL LOW (ref 4.0–10.5)

## 2013-02-27 LAB — GLUCOSE, CAPILLARY: Glucose-Capillary: 112 mg/dL — ABNORMAL HIGH (ref 70–99)

## 2013-02-27 LAB — CULTURE, BLOOD (ROUTINE X 2)

## 2013-02-27 MED ORDER — FLUCONAZOLE 100 MG PO TABS
100.0000 mg | ORAL_TABLET | Freq: Every day | ORAL | Status: DC
  Administered 2013-02-27 – 2013-02-28 (×2): 100 mg via ORAL
  Filled 2013-02-27 (×3): qty 1

## 2013-02-27 MED ORDER — DEXAMETHASONE 4 MG PO TABS
4.0000 mg | ORAL_TABLET | Freq: Three times a day (TID) | ORAL | Status: DC
  Administered 2013-02-28 – 2013-03-02 (×7): 4 mg via ORAL
  Filled 2013-02-27 (×12): qty 1

## 2013-02-27 NOTE — Progress Notes (Signed)
Pt has had 6 loose stools since 10am on 02-26-13.

## 2013-02-27 NOTE — Progress Notes (Signed)
[facility] PROGRESS NOTE  Derrick Crosby AVW:[id number] DOB: 03-30-1951 DOA: 02/24/2013 PCP: Arlyss Queen  Assessment/Plan:  Acute encephalopathy  - more likely secondary PN since the left frontal brain lesion  and surrounding edema have improved  Per CT, MRI also shows regression of frontal lobe lesion with significant decrease in edema -empiric abx for PNA as below - discussed pt with Dr Arbutus Ped and he agrees encephalopathy more likle secondary to PNA  And to taper  Decadron, change to PO- per Dr Michell Heinrich he was on q8 dexamethasone 4mg  but 2mg  in the pm and tapering by 2mg  every 5days. 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  - Continue Mucinex and antitussives.  -stable Convulsions/seizures  - no seizure activity reported  - continue Keppra 500 mg BID  Leukopenia  - due to history of malignancy  - continue to monitor WBC count  Thrombocytopenia  - last chemo was in Jan and plt count in 01/2013 was 200 - platelet count 29 today, trending up, but on review of records-last plt count in 01/2013 was 200 - radiation which pt has been receiving, and infection all contributing factors. -avoiding antiplatelet agents -treating infection as above -no gross bleeding, follow  Hyponatremia  - likely dehydration  - IV fluids given in ED  PNA (pneumonia)  -continue Zosyn for possible aspiration pneumonia  -blood culture with 1/2 GPC, culture with coag neg staph  likely contaminant- will d/c vanc and monitor for fevers/clinical course. -levaquin dc'ed on 4/20 ?Probable bacteremia - likely contaminant -1/2 blood culture  with GPC in clusters- culture with coag neg staph  likely contaminant - will d/c vanc and monitor  odynophagia -appreciate ST input -seen by GI consult - Dr Arlyce Dice 4/21, EGD held off due to thrombocytopenia -discussed plt transfusion prior to procedure with GI on 4/21>> await final plan per GI -discussed pt with Dr Michell Heinrich today and she states radiation induced esophagitis very unlikely since his last chest- she states candida esophagitis more likely as he has been on high doses of dexamethasone -Pt empirically placed on diflucan per GI, follow   Elevated LFTs -await [place]  Code Status: FULL Family Communication: Wife at bedside Disposition Plan: pending clinical course   Consultants:  GI - Dr Arlyce Dice   Procedures:  none  Antibiotics:  Zosyn started on 4/19  levaquin 4/19>>4/20  vanc >> started 4/20  Diflucan started 4/22  HPI/Subjective: eating a little better today although per wife still appears to be in pain with swallowing. Objective: Filed Vitals:   02/26/13 0600 02/26/13 1350 02/26/13 2100 02/27/13 0536  BP: 130/80 107/61 133/86 115/73  Pulse: 54 64 73 55  Temp: 97.5 F (36.4 C) 97.8 F (36.6 C) 98.2 F (36.8 C) 97.6 F (36.4 C)  TempSrc: Oral Oral Oral Oral  Resp: 18 20 18 16   Height:      Weight:      SpO2: 93% 93% 91% 92%    Intake/Output Summary (Last 24 hours) at 02/27/13 1017 Last data filed at 02/27/13 0629  Gross per 24 hour  Intake 3287.33 ml  Output      0 ml  Net 3287.33 ml  Filed Weights   02/24/13 1347 02/25/13 1434  Weight: 71.3 kg (157 lb 3 oz) 73.6 kg (162 lb 4.1 oz)    Exam:   General:  Alert and oriented x1, in NAD  Cardiovascular: RRR  Respiratory: decreased BS at bases  Abdomen: soft +BS NT/ND  Extremities: no edema, no cyanosis   Data Reviewed: Basic Metabolic Panel:  Recent Labs Lab 02/24/13 1140 02/24/13 1510 02/25/13 0531 02/26/13 0500 02/27/13 0500  NA 134* 135 132* 134* 135  K 3.6 3.6 3.8 3.8  3.4*  CL 96 95* 96 99 102  CO2 28 31 29 29 25   GLUCOSE 97 83 137* 132* 115*  BUN 21 21 21 23 18   CREATININE 0.77 0.81 0.84 0.78 0.75  CALCIUM 8.9 8.7 8.4 8.4 8.0*  MG  --  1.7  --   --   --   PHOS  --  3.9  --   --   --    Liver Function Tests:  Recent Labs Lab 02/24/13 1140 02/24/13 1510 02/25/13 0531  AST 27 24 19   ALT 82* 75* 62*  ALKPHOS 96 88 78  BILITOT 0.6 0.7 0.4  PROT 5.9* 5.6* 5.1*  ALBUMIN 1.9* 1.9* 1.7*   No results found for this basename: LIPASE, AMYLASE,  in the last 168 hours No results found for this basename: AMMONIA,  in the last 168 hours CBC:  Recent Labs Lab 02/24/13 1140 02/24/13 1510 02/25/13 0531 02/26/13 0500 02/27/13 0500  WBC 3.0* 2.3* 2.1* 2.9* 3.3*  NEUTROABS 2.6 1.9  --   --   --   HGB 13.1 12.6* 11.4* 11.4* 10.2*  HCT 37.3* 36.3* 32.6* 32.2* 29.1*  MCV 88.2 88.3 87.6 87.7 87.7  PLT 29* 28* 24* 26* 29*   Cardiac Enzymes: No results found for this basename: CKTOTAL, CKMB, CKMBINDEX, TROPONINI,  in the last 168 hours BNP (last 3 results) No results found for this basename: PROBNP,  in the last 8760 hours CBG:  Recent Labs Lab 02/25/13 0742 02/26/13 0728 02/27/13 0740  GLUCAP 122* 142* 112*    Recent Results (from the past 240 hour(s))  CULTURE, BLOOD (ROUTINE X 2)     Status: None   Collection Time    02/24/13  1:20 PM      Result Value Range Status   Specimen Description BLOOD LEFT ARM   Final   Special Requests BOTTLES DRAWN AEROBIC ONLY 2.5 CC    Final   Culture  Setup Time 02/24/2013 17:47   Final   Culture     Final   Value: STAPHYLOCOCCUS SPECIES (COAGULASE NEGATIVE)     Note: THE SIGNIFICANCE OF ISOLATING THIS ORGANISM FROM A SINGLE SET OF BLOOD CULTURES WHEN MULTIPLE SETS ARE DRAWN IS UNCERTAIN. PLEASE NOTIFY THE MICROBIOLOGY DEPARTMENT WITHIN ONE WEEK IF SPECIATION AND SENSITIVITIES ARE REQUIRED.     Note: Gram Stain Report Called to,Read Back By and Verified With: HEATHER BULLINS 02/25/13 @ 12:38PM BY RUSCA.    Report Status 02/27/2013 FINAL   Final  CULTURE, BLOOD (ROUTINE X 2)     Status: None   Collection Time    02/24/13  1:26 PM      Result Value Range Status   Specimen Description BLOOD RIGHT ANTECUBITAL   Final   Special Requests     Final   Value: BOTTLES DRAWN AEROBIC AND ANAEROBIC 4 CC BLUE, 3 CC RED   Culture  Setup Time 02/24/2013 17:48   Final   Culture  Final   Value:        BLOOD CULTURE RECEIVED NO GROWTH TO DATE CULTURE WILL BE HELD FOR 5 DAYS BEFORE ISSUING A FINAL NEGATIVE REPORT   Report Status PENDING   Incomplete  MRSA PCR SCREENING     Status: None   Collection Time    02/24/13  5:45 PM      Result Value Range Status   MRSA by PCR NEGATIVE  NEGATIVE Final   Comment:            The GeneXpert MRSA Assay (FDA     approved for NASAL specimens     only), is one component of a     comprehensive MRSA colonization     surveillance program. It is not     intended to diagnose MRSA     infection nor to guide or     monitor treatment for     MRSA infections.     Studies: Mr Laqueta Jean ZO Contrast  27-Feb-2013  *RADIOLOGY REPORT*  Clinical Data: 62 year old male with metastatic lung cancer. Solitary brain metastasis detected on 01/22/2013.  Confusion. Restaging.  MRI HEAD WITHOUT AND WITH CONTRAST  Technique:  Multiplanar, multiecho pulse sequences of the brain and surrounding structures were obtained according to standard protocol without and with intravenous contrast  Contrast: 14mL MULTIHANCE GADOBENATE DIMEGLUMINE 529 MG/ML IV SOLN  Comparison: 01/22/2013 and earlier.  Findings: Regressed left superior frontal gyrus enhancing metastasis, enhancing component now measuring up to 16 x 16 mm transaxially, previously 32 x 22 mm.  No additional brain parenchymal metastasis identified.  Significantly reduced edema surrounding the known metastasis. Stable gray and white matter signal elsewhere.  Currently no significant mass effect. No restricted diffusion to suggest acute infarction.   Major intracranial vascular flow voids are stable. No acute intracranial hemorrhage identified.  Negative pituitary, cervicomedullary junction, and visible cervical spine.  The normal bone marrow signal.  Visualized orbit soft tissues are within normal limits.  Increased paranasal sinus mucosal thickening and fluid levels.  Small left mastoid effusion is new.  Negative nasopharynx.  Negative scalp soft tissues.  IMPRESSION: 1.  Regression of solitary left superior frontal gyrus brain metastasis.  Significantly reduced edema and no significant mass effect at this time. 2.  No additional metastatic disease or new intracranial abnormality identified.   Original Report Authenticated By: Erskine Speed, M.D.     Scheduled Meds: . amitriptyline  25 mg Oral QHS  . aspirin EC  81 mg Oral Daily  . chlorpheniramine-HYDROcodone  5 mL Oral Q12H  . citalopram  20 mg Oral QPM  . dexamethasone  4 mg Intravenous Q8H  . feeding supplement  1 Container Oral TID BM  . fluconazole  100 mg Oral Daily  . gabapentin  600 mg Oral TID  . guaiFENesin  1,200 mg Oral BID  . levETIRAcetam  500 mg Oral BID  . loratadine  10 mg Oral Daily  . omega-3 acid ethyl esters  1 g Oral Daily  . pantoprazole  40 mg Oral BID  . piperacillin-tazobactam (ZOSYN)  IV  3.375 g Intravenous Q8H  . polyethylene glycol  17 g Oral Daily  . sucralfate  1 g Oral Q6H  . triamcinolone cream  1 application Topical BID  . vancomycin  750 mg Intravenous Q8H   Continuous Infusions: . sodium chloride Stopped (02/27/13 0536)  . 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

## 2013-02-27 NOTE — Progress Notes (Signed)
[facility] Progress Note  SUBJECTIVE: No significant improvement in swallowing. He is NPO but still hurts to swallow saliva  OBJECTIVE:  Vital signs in last 24 hours: Temp:  [97.6 F (36.4 C)-98.2 F (36.8 C)] 97.6 F (36.4 C) (04/22 0536) Pulse Rate:  [55-73] 55 (04/22 0536) Resp:  [16-20] 16 (04/22 0536) BP: (107-133)/(61-86) 115/73 mmHg (04/22 0536) SpO2:  [91 %-93 %] 92 % (04/22 0536) Last BM Date: 02/27/13 General:    white male in NAD Heart:  Slightly bradycardic Lungs: Respirations even and unlabored Abdomen:  Soft, nontender and nondistended. Normal bowel sounds. Extremities:  Without edema. Neurologic:  Sleepy  Lab Results:  Recent Labs  02/25/13 0531 02/26/13 0500 02/27/13 0500  WBC 2.1* 2.9* 3.3*  HGB 11.4* 11.4* 10.2*  HCT 32.6* 32.2* 29.1*  PLT 24* 26* 29*   BMET  Recent Labs  02/25/13 0531 02/26/13 0500 02/27/13 0500  NA 132* 134* 135  K 3.8 3.8 3.4*  CL 96 99 102  CO2 29 29 25   GLUCOSE 137* 132* 115*  BUN 21 23 18   CREATININE 0.84 0.78 0.75  CALCIUM 8.4 8.4 8.0*   LFT  Recent Labs  02/25/13 0531  PROT 5.1*  ALBUMIN 1.7*  AST 19  ALT 62*  ALKPHOS 78  BILITOT 0.4   PT/INR  Recent Labs  02/24/13 1510  LABPROT 13.8  INR 1.07    ASSESSMENT / PLAN  1. Odynophagia/dysphagia since initiation of chest radiation for lung cancer last summer. 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 Continue BID PPI and carafate slurries. Will add diflucan  2. Lung cancer with brain mets, s/p chemoxrt.  3. Thrombocytopenia, leukopenia. WBC up to 3.3 today but platelets only 29.     LOS: 3 days   Willette Cluster  02/27/2013, 9:14 AM  Unable to do EGD b/c of thrombocytopenia.  Plan barium esophagram.

## 2013-02-28 ENCOUNTER — Inpatient Hospital Stay (HOSPITAL_COMMUNITY): Payer: BC Managed Care – PPO

## 2013-02-28 ENCOUNTER — Ambulatory Visit (HOSPITAL_COMMUNITY): Payer: BC Managed Care – PPO

## 2013-02-28 ENCOUNTER — Other Ambulatory Visit: Payer: BC Managed Care – PPO | Admitting: Lab

## 2013-02-28 LAB — GLUCOSE, CAPILLARY: Glucose-Capillary: 112 mg/dL — ABNORMAL HIGH (ref 70–99)

## 2013-02-28 LAB — COMPREHENSIVE METABOLIC PANEL
Albumin: 2 g/dL — ABNORMAL LOW (ref 3.5–5.2)
BUN: 18 mg/dL (ref 6–23)
Chloride: 100 mEq/L (ref 96–112)
Creatinine, Ser: 0.83 mg/dL (ref 0.50–1.35)
Total Bilirubin: 0.4 mg/dL (ref 0.3–1.2)

## 2013-02-28 LAB — CBC
HCT: 34.1 % — ABNORMAL LOW (ref 39.0–52.0)
MCHC: 34.9 g/dL (ref 30.0–36.0)
MCV: 87 fL (ref 78.0–100.0)
RDW: 13.7 % (ref 11.5–15.5)
WBC: 4.1 10*3/uL (ref 4.0–10.5)

## 2013-02-28 MED ORDER — POTASSIUM CHLORIDE 20 MEQ/15ML (10%) PO LIQD
40.0000 meq | Freq: Once | ORAL | Status: AC
  Administered 2013-02-28: 40 meq via ORAL
  Filled 2013-02-28: qty 30

## 2013-02-28 NOTE — Progress Notes (Signed)
Physical Therapy Treatment Patient Details Name: Derrick Crosby MRN: [id number] DOB: November 10, 1950 Today's Date: 02/28/2013 Time: 0920-0943 PT Time Calculation (min): 23 min  PT Assessment / Plan / Recommendation Comments on Treatment Session  Pt progressing well and plans to D/C to home.  has all equipment. Spouse able to assist.    Follow Up Recommendations  Home health PT;Supervision for mobility/OOB     Does the patient have the potential to tolerate intense rehabilitation     Barriers to Discharge        Equipment Recommendations  None recommended by PT    Recommendations for Other Services    Frequency     Plan Discharge plan remains appropriate    Precautions / Restrictions Precautions Precautions: Fall Precaution Comments: Note has radiation burn on neck and pain with swallowing.  Restrictions Weight Bearing Restrictions: No   Pertinent Vitals/Pain C/o "soreness" throat    Mobility  Bed Mobility Bed Mobility: Not assessed Details for Bed Mobility Assistance: Pt sitting EOB on arrival Transfers Transfers: Sit to Stand;Stand to Sit Sit to Stand: 5: Supervision;4: Min guard;From bed Stand to Sit: 5: Supervision;4: Min guard Details for Transfer Assistance: Min/guard for safety and steadying with cues for hand placement and safety when sitting/standing.  Ambulation/Gait Ambulation/Gait Assistance: 4: Min guard Ambulation Distance (Feet): 345 Feet Assistive device: Rolling walker Ambulation/Gait Assistance Details: good alternating gait with good safety cognition Gait Pattern: Step-through pattern;Decreased stride length Gait velocity: decreased Stairs: Yes Stairs Assistance: 4: Min guard Stair Management Technique: No rails;Forwards Number of Stairs: 2    PT Goals                                            progressing    Visit Information  Last PT Received On: 02/28/13 Assistance Needed: +1    Subjective Data      Cognition    good   Balance  good  End of Session PT - End of Session Equipment Utilized During Treatment: Gait belt Activity Tolerance: Patient tolerated treatment well Patient left: in chair;with family/visitor present;with call bell/phone within reach   Felecia Shelling  PTA [facility]  Acute  Rehab Pager      [phone]

## 2013-02-28 NOTE — Progress Notes (Signed)
[facility] Progress Note  SUBJECTIVE: Odynophagia slightly better today. He did eat some eggs. He does complain of watery stools but no more than twice daily  OBJECTIVE:  Vital signs in last 24 hours: Temp:  [97.6 F (36.4 C)-98 F (36.7 C)] 98 F (36.7 C) (04/23 0538) Pulse Rate:  [51-88] 51 (04/23 0538) Resp:  [16] 16 (04/23 0538) BP: (112-146)/(74-83) 132/79 mmHg (04/23 0538) SpO2:  [94 %-97 %] 95 % (04/23 0538) Last BM Date: 02/27/13 General:    white male in NAD, up in chair. Wife in room Abdomen:  Soft, nontender and nondistended. Normal bowel sounds. Psych:  Cooperative. Normal mood and affect.   Lab Results:  Recent Labs  02/26/13 0500 02/27/13 0500 02/28/13 0510  WBC 2.9* 3.3* 4.1  HGB 11.4* 10.2* 11.9*  HCT 32.2* 29.1* 34.1*  PLT 26* 29* 42*   BMET  Recent Labs  02/26/13 0500 02/27/13 0500 02/28/13 0510  NA 134* 135 134*  K 3.8 3.4* 3.4*  CL 99 102 100  CO2 29 25 25   GLUCOSE 132* 115* 122*  BUN 23 18 18   CREATININE 0.78 0.75 0.83  CALCIUM 8.4 8.0* 8.5   LFT  Recent Labs  02/28/13 0510  PROT 5.2*  ALBUMIN 2.0*  AST 20  ALT 68*  ALKPHOS 88  BILITOT 0.4    Studies/Results: US Abdomen Complete  02/27/2013  *RADIOLOGY REPORT*  Clinical Data:    Elevated liver function tests.  Worsening thrombocytopenia.  The  COMPLETE ABDOMINAL ULTRASOUND  Comparison:  CT abdomen and pelvis 11/27/2012.  Findings:  Gallbladder:  Multiple small stones are present.  Gallbladder wall thickening is noted up to 4.5 mm.  Negative sonographic Murphy's sign.  Common bile duct:  Normal measuring 4.0 mm.  Liver:  Increased echogenicity. No intrahepatic biliary ductal dilatation.Multiple cysts are demonstrated consistent with the CT appearance.  No solid masses.  IVC:  Appears normal.  Pancreas:  Not well visualized due to bowel gas.  Spleen:  Normal measuring 8.2 cm.  Right Kidney:  Normal measuring 10.1 cm.  Left Kidney:  Normal measuring 11.6 cm.  Abdominal  aorta:  No aneurysm identified.  IMPRESSION: Cholelithiasis.  Gallbladder wall thickening up to 4.5 mm.  No right upper quadrant tenderness or biliary ductal dilatation. Hepatic steatosis without visible metastatic disease.   Original Report Authenticated By: Davonna Belling, M.D.      ASSESSMENT / PLAN:  1. Odynophagia/dysphagia since initiation of chest radiation for lung cancer last summer. Patient tells me today that main problem is odynophagia rather than dysphagia. He ate eggs this am, taking PO meds okay. Pain slightly better today. Started Diflucan empircally yesterday. Hospitalist spoke to radiation onc and patient recently received high dose dexamethasone so this certainly could be candida esophagitis. Patient for esophagram this am to make sure no strictures present. If no strictures and he continues to improve then will hold off on EGD. Continue BID PPI, carafate slurries, and PO Diflucan.   2. Lung cancer with brain mets, s/p chemoxrt.   3. Thrombocytopenia, leukopenia.  Both improving. Platelet now up to 42.   4. Loose stool. Only having 2-3 episodes a day, no abdominal pain. WBC normal. On Zosyn, so may be antibiotic related. If diarrhea persists after completion of antibiotics, or if having more frequent episodes then need to check C-diff.   5. PNA, on Zosyn.    LOS: 4 days   I have personally taken an interval history, reviewed the chart, and examined the patient.  I  agree with the extender's note, impression and recommendations.  Barbette Hair. Arlyce Dice, MD, [facility] [address]  02/28/2013, 10:18 AM

## 2013-02-28 NOTE — Progress Notes (Addendum)
SLP Note:  Pt seen for skilled dysphagia treatment to assess tolerance of diet and for family education.  Rec consider advancing diet to mechanical soft/thin per pt/spouse wishes.  Note results of esophagram indicating esophagitis.     Education completed to mitigate aspiration risk/compensate for multifactorial dysphagia including esophagitis and xerostomia.     Observed pt with small bite of cookie and water- delayed mastication secondary to lack of dentition and pt's reports of blisters in oral cavity.  Appearance of mild red tinged area on lower gum.  Delayed cough apparent with water, but pt position in bed was poor.     Aspiration risk ongoing in this SLP's opinion secondary to multifactorial dysphagia with decreased pt awareness, medical condition and pt pulmonary status.    Intake remains poor per spouse and she attributes some of this to limited diet.  Pain continues with swallowing and pt noted to wince when swallowing any intake.   Spouse asked re: magic mouthwash to help with pain- SLP checked order in computer and mouthwash is active order PRN, encouraged spouse to ask RN/MD re: receiving this medication.    Spouse in agreement to SLP signing off as all education completed re: oropharyngeal dysphagia .  Hopeful for improved intake with diet advancement and treatment of esophagitis.    Please reorder if desire.  Thanks!   Donavan Burnet, MS [facility] SLP [phone]

## 2013-02-28 NOTE — Progress Notes (Signed)
[facility] PROGRESS NOTE  Derrick Crosby ZOX:[id number] DOB: 09-27-1951 DOA: 02/24/2013 PCP: Arlyss Queen  Assessment/Plan:  Acute encephalopathy  - more likely secondary PN since the left frontal brain lesion  and surrounding edema have improved  Per CT, MRI also shows regression of frontal lobe lesion with significant decrease in edema -empiric abx for PNA as below - discussed pt with Dr Arbutus Ped and he agrees encephalopathy more likle secondary to PNA  And to taper  Decadron, change to PO - per Dr Michell Heinrich he was on q8 dexamethasone 4mg  but 2mg  in the pm and tapering by 2mg  every 5days and is to continue the taper when discharged. 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  - Continue Mucinex and antitussives.  -stable Convulsions/seizures  - no seizure activity reported  - continue Keppra 500 mg BID  Leukopenia  - due to history of malignancy  - continue to monitor WBC count  Thrombocytopenia  - last chemo was in Jan and plt count in 01/2013 was 200 - platelet count 42 today,continues to trend up, (on review of records-last plt count in 01/2013 was 200 - infection most likelycontributing factor. -avoiding antiplatelet agents -treating infection as above -no gross bleeding, follow  Hyponatremia  - likely dehydration  - IV fluids given in ED  PNA (pneumonia)  -continue Zosyn for possible aspiration pneumonia  -blood culture with 1/2 GPC, culture with coag neg staph  likely contaminant- will d/c vanc and monitor for fevers/clinical course. -levaquin dc'ed on 4/20 ?Probable bacteremia -  likely contaminant -1/2 blood culture with GPC in clusters- culture with coag neg staph  likely contaminant - vanc dc'ed on 4/22 and pt remaining afebrile so far, continue to monitor  Odynophagia -likely candida, continue diflucan -seen by GI consult - Dr Arlyce Dice 4/21, EGD held off due to thrombocytopenia -discussed plt transfusion prior to procedure with GI on 4/21>> but for now GI holding EGD, follow for improvement on difucan -discussed pt with Dr Michell Heinrich today and she states radiation induced esophagitis very unlikely since his last chest- she states candida esophagitis more likely as he has been on high doses of dexamethasone -pt with some improvement, follow -appreciate GI assistance, Esophagram to eval for stricture ordered for today, follow  Elevated LFTs -await [place]  Code Status: FULL Family Communication: Wife at bedside Disposition Plan: pending clinical course   Consultants:  GI - Dr Arlyce Dice   Procedures:  none  Antibiotics:  Zosyn started on 4/19  levaquin 4/19>>4/20  vanc >> started 4/20  Diflucan started 4/22  HPI/Subjective: eating a little better today although per wife still appears to be in pain with swallowing. Objective: Filed Vitals:   02/27/13 0536 02/27/13 1327 02/27/13 2121 02/28/13 0538  BP: 115/73 112/74 146/83 132/79  Pulse: 55 88 68 51  Temp: 97.6 F (36.4 C) 97.6 F (36.4 C) 98 F (36.7 C) 98 F (36.7 C)  TempSrc: Oral Oral Oral Oral  Resp: 16 16 16 16   Height:      Weight:      SpO2: 92% 97% 94% 95%    Intake/Output Summary (Last 24 hours) at 02/28/13 1121 Last  data filed at 02/27/13 1841  Gross per 24 hour  Intake    240 ml  Output      0 ml  Net    240 ml   Filed Weights   02/24/13 1347 02/25/13 1434  Weight: 71.3 kg (157 lb 3 oz) 73.6 kg (162 lb 4.1 oz)    Exam:   General:  Alert and oriented x1, in NAD  Cardiovascular: RRR  Respiratory: decreased BS at bases  Abdomen: soft +BS NT/ND  Extremities: no  edema, no cyanosis   Data Reviewed: Basic Metabolic Panel:  Recent Labs Lab 02/24/13 1140 02/24/13 1510 02/25/13 0531 02/26/13 0500 02/27/13 0500 02/28/13 0510  NA 134* 135 132* 134* 135 134*  K 3.6 3.6 3.8 3.8 3.4* 3.4*  CL 96 95* 96 99 102 100  CO2 28 31 29 29 25 25   GLUCOSE 97 83 137* 132* 115* 122*  BUN 21 21 21 23 18 18   CREATININE 0.77 0.81 0.84 0.78 0.75 0.83  CALCIUM 8.9 8.7 8.4 8.4 8.0* 8.5  MG  --  1.7  --   --   --   --   PHOS  --  3.9  --   --   --   --    Liver Function Tests:  Recent Labs Lab 02/24/13 1140 02/24/13 1510 02/25/13 0531 02/28/13 0510  AST 27 24 19 20   ALT 82* 75* 62* 68*  ALKPHOS 96 88 78 88  BILITOT 0.6 0.7 0.4 0.4  PROT 5.9* 5.6* 5.1* 5.2*  ALBUMIN 1.9* 1.9* 1.7* 2.0*   No results found for this basename: LIPASE, AMYLASE,  in the last 168 hours No results found for this basename: AMMONIA,  in the last 168 hours CBC:  Recent Labs Lab 02/24/13 1140 02/24/13 1510 02/25/13 0531 02/26/13 0500 02/27/13 0500 02/28/13 0510  WBC 3.0* 2.3* 2.1* 2.9* 3.3* 4.1  NEUTROABS 2.6 1.9  --   --   --   --   HGB 13.1 12.6* 11.4* 11.4* 10.2* 11.9*  HCT 37.3* 36.3* 32.6* 32.2* 29.1* 34.1*  MCV 88.2 88.3 87.6 87.7 87.7 87.0  PLT 29* 28* 24* 26* 29* 42*   Cardiac Enzymes: No results found for this basename: CKTOTAL, CKMB, CKMBINDEX, TROPONINI,  in the last 168 hours BNP (last 3 results) No results found for this basename: PROBNP,  in the last 8760 hours CBG:  Recent Labs Lab 02/25/13 0742 02/26/13 0728 02/27/13 0740 02/28/13 0739  GLUCAP 122* 142* 112* 112*    Recent Results (from the past 240 hour(s))  CULTURE, BLOOD (ROUTINE X 2)     Status: None   Collection Time    02/24/13  1:20 PM      Result Value Range Status   Specimen Description BLOOD LEFT ARM   Final   Special Requests BOTTLES DRAWN AEROBIC ONLY 2.5 CC    Final   Culture  Setup Time 02/24/2013 17:47   Final   Culture     Final   Value: STAPHYLOCOCCUS SPECIES (COAGULASE  NEGATIVE)     Note: THE SIGNIFICANCE OF ISOLATING THIS ORGANISM FROM A SINGLE SET OF BLOOD CULTURES WHEN MULTIPLE SETS ARE DRAWN IS UNCERTAIN. PLEASE NOTIFY THE MICROBIOLOGY DEPARTMENT WITHIN ONE WEEK IF SPECIATION AND SENSITIVITIES ARE REQUIRED.     Note: Gram Stain Report Called to,Read Back By and Verified With: HEATHER BULLINS 02/25/13 @ 12:38PM BY RUSCA.   Report Status 02/27/2013 FINAL   Final  CULTURE, BLOOD (ROUTINE X 2)     Status:  None   Collection Time    02/24/13  1:26 PM      Result Value Range Status   Specimen Description BLOOD RIGHT ANTECUBITAL   Final   Special Requests     Final   Value: BOTTLES DRAWN AEROBIC AND ANAEROBIC 4 CC BLUE, 3 CC RED   Culture  Setup Time 02/24/2013 17:48   Final   Culture     Final   Value:        BLOOD CULTURE RECEIVED NO GROWTH TO DATE CULTURE WILL BE HELD FOR 5 DAYS BEFORE ISSUING A FINAL NEGATIVE REPORT   Report Status PENDING   Incomplete  MRSA PCR SCREENING     Status: None   Collection Time    02/24/13  5:45 PM      Result Value Range Status   MRSA by PCR NEGATIVE  NEGATIVE Final   Comment:            The GeneXpert MRSA Assay (FDA     approved for NASAL specimens     only), is one component of a     comprehensive MRSA colonization     surveillance program. It is not     intended to diagnose MRSA     infection nor to guide or     monitor treatment for     MRSA infections.     Studies: US Abdomen Complete  02/27/2013  *RADIOLOGY REPORT*  Clinical Data:    Elevated liver function tests.  Worsening thrombocytopenia.  The  COMPLETE ABDOMINAL ULTRASOUND  Comparison:  CT abdomen and pelvis 11/27/2012.  Findings:  Gallbladder:  Multiple small stones are present.  Gallbladder wall thickening is noted up to 4.5 mm.  Negative sonographic Murphy's sign.  Common bile duct:  Normal measuring 4.0 mm.  Liver:  Increased echogenicity. No intrahepatic biliary ductal dilatation.Multiple cysts are demonstrated consistent with the CT appearance.  No  solid masses.  IVC:  Appears normal.  Pancreas:  Not well visualized due to bowel gas.  Spleen:  Normal measuring 8.2 cm.  Right Kidney:  Normal measuring 10.1 cm.  Left Kidney:  Normal measuring 11.6 cm.  Abdominal aorta:  No aneurysm identified.  IMPRESSION: Cholelithiasis.  Gallbladder wall thickening up to 4.5 mm.  No right upper quadrant tenderness or biliary ductal dilatation. Hepatic steatosis without visible metastatic disease.   Original Report Authenticated By: Davonna Belling, M.D.     Scheduled Meds: . amitriptyline  25 mg Oral QHS  . aspirin EC  81 mg Oral Daily  . chlorpheniramine-HYDROcodone  5 mL Oral Q12H  . citalopram  20 mg Oral QPM  . dexamethasone  4 mg Oral Q8H  . feeding supplement  1 Container Oral TID BM  . fluconazole  100 mg Oral Daily  . gabapentin  600 mg Oral TID  . guaiFENesin  1,200 mg Oral BID  . levETIRAcetam  500 mg Oral BID  . loratadine  10 mg Oral Daily  . omega-3 acid ethyl esters  1 g Oral Daily  . pantoprazole  40 mg Oral BID  . piperacillin-tazobactam (ZOSYN)  IV  3.375 g Intravenous Q8H  . polyethylene glycol  17 g Oral Daily  . sucralfate  1 g Oral Q6H  . triamcinolone cream  1 application Topical BID   Continuous Infusions: . sodium chloride Stopped (02/27/13 0536)  . 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

## 2013-03-01 ENCOUNTER — Ambulatory Visit: Payer: Self-pay | Admitting: Internal Medicine

## 2013-03-01 DIAGNOSIS — K208 Other esophagitis without bleeding: Secondary | ICD-10-CM | POA: Diagnosis present

## 2013-03-01 LAB — CBC
HCT: 32.1 % — ABNORMAL LOW (ref 39.0–52.0)
Platelets: 44 10*3/uL — ABNORMAL LOW (ref 150–400)
RDW: 13.8 % (ref 11.5–15.5)
WBC: 4.5 10*3/uL (ref 4.0–10.5)

## 2013-03-01 LAB — BASIC METABOLIC PANEL
Calcium: 8.2 mg/dL — ABNORMAL LOW (ref 8.4–10.5)
Chloride: 103 mEq/L (ref 96–112)
Creatinine, Ser: 0.78 mg/dL (ref 0.50–1.35)
GFR calc Af Amer: >90 mL/min (ref >90)
GFR calc non Af Amer: >90 mL/min (ref >90)

## 2013-03-01 LAB — MAGNESIUM: Magnesium: 1.7 mg/dL (ref 1.5–2.5)

## 2013-03-01 LAB — GLUCOSE, CAPILLARY: Glucose-Capillary: 108 mg/dL — ABNORMAL HIGH (ref 70–99)

## 2013-03-01 MED ORDER — POTASSIUM CHLORIDE CRYS ER 20 MEQ PO TBCR
40.0000 meq | EXTENDED_RELEASE_TABLET | Freq: Once | ORAL | Status: AC
  Administered 2013-03-01: 40 meq via ORAL
  Filled 2013-03-01: qty 2

## 2013-03-01 MED ORDER — MAGIC MOUTHWASH W/LIDOCAINE
5.0000 mL | Freq: Four times a day (QID) | ORAL | Status: DC
  Administered 2013-03-01 – 2013-03-02 (×3): 5 mL via ORAL
  Filled 2013-03-01 (×6): qty 5

## 2013-03-01 NOTE — Progress Notes (Signed)
Pt was active with [facility] before admission to hospital. I spoke with the wife who told me she wants to continue using them as the home health care provider. I left a VM for Bonnye Fava informing her of possible d/c for 4/25 and needed services.  Algernon Huxley, RN BSN  [phone]

## 2013-03-01 NOTE — Progress Notes (Signed)
Patient ID: Derrick Crosby, male   DOB: 02/20/1951, 62 y.o.   MRN: [id number] [facility] Progress Note  Subjective: Barium swallow shows an 8 cm area of esophagitis in mid to distal esophagus  In area of previous radiation- no mass or stricture Pt in good spirits-feeling better-tolerating soft foods without difficulty-some foods and meds "Burn" but definitely improving. He wants to go home. Long discussion with wife and family- they are confused about his course, etc-answered questions and gave the hospitalists name to discuss further   Objective:  Vital signs in last 24 hours: Temp:  [97.4 F (36.3 C)-97.7 F (36.5 C)] 97.4 F (36.3 C) (04/24 0604) Pulse Rate:  [53-84] 53 (04/24 0604) Resp:  [18] 18 (04/24 0604) BP: (116-133)/(72-84) 116/72 mmHg (04/24 0604) SpO2:  [92 %-97 %] 92 % (04/24 0604) Last BM Date: 02/27/13 General:   Alert,  Well-developed, WM   in NAD Heart:  Regular rate and rhythm; no murmurs Pulm;clear ant Abdomen:  Soft, nontender and nondistended. Normal bowel sounds, without guarding, and without rebound.   Extremities:  Without edema. Neurologic:  Alert and  oriented x4;  grossly normal neurologically. Psych:  Alert and cooperative. Normal mood and affect.  Intake/Output from previous day:   Intake/Output this shift:    Lab Results:  Recent Labs  02/27/13 0500 02/28/13 0510 03/01/13 0630  WBC 3.3* 4.1 4.5  HGB 10.2* 11.9* 11.3*  HCT 29.1* 34.1* 32.1*  PLT 29* 42* 44*   BMET  Recent Labs  02/27/13 0500 02/28/13 0510 03/01/13 0630  NA 135 134* 137  K 3.4* 3.4* 3.4*  CL 102 100 103  CO2 25 25 26   GLUCOSE 115* 122* 127*  BUN 18 18 19   CREATININE 0.75 0.83 0.78  CALCIUM 8.0* 8.5 8.2*   LFT  Recent Labs  02/28/13 0510  PROT 5.2*  ALBUMIN 2.0*  AST 20  ALT 68*  ALKPHOS 88  BILITOT 0.4     Assessment / Plan: #1 62 yo male with metastatic squamous cell lung Ca to brain #2 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 Soft  diet as tolerates Continue BID PPI on discharge , addCarafate slurry  4 times daily between meals and bedtime and continue at discharge Complete 10 day course of flucanazole  We will be available- no EGD indicated  Principal Problem:   Acute encephalopathy Active Problems:   Anxiety   Depression   Malignant neoplasm of main bronchus   Squamous cell lung cancer   COPD (chronic obstructive pulmonary disease)   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  I agree with the extender's note, impression and recommendations.    Barbette Hair. Derrick Dice, MD, [facility] [phone]

## 2013-03-01 NOTE — Progress Notes (Signed)
[facility] PROGRESS NOTE  Derrick Crosby JYN:[id number] DOB: [birth year] DOA: 02/24/2013 PCP: Arlyss Queen  Assessment/Plan:  Acute encephalopathy  - more likely secondary PNA since the left frontal brain lesion  and surrounding edema have improved  Per CT, MRI also shows regression of frontal lobe lesion with significant decrease in edema -empiric abx for PNA as below - Dr Suanne Marker discussed pt with Dr Arbutus Ped and he agrees encephalopathy more likle secondary to PNA  And to taper  Decadron, change to PO - per Dr Michell Heinrich he was on q8 dexamethasone 4mg  but 2mg  in the pm and tapering by 2mg  every 5days and is to continue the taper when discharged. 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  - Continue Mucinex and antitussives.  -stable Convulsions/seizures  - no seizure activity reported  - continue Keppra 500 mg BID  Leukopenia  - due to history of malignancy  - continue to monitor WBC count  Thrombocytopenia  - last chemo was in Jan and plt count in 01/2013 was 200 - platelet count 44 today from 42 yesterday,continues to trend up, (on review of records-last plt count in 01/2013 was 200 - infection most likely contributing factor. -avoiding antiplatelet agents -treating infection as above -no gross bleeding, follow  Hyponatremia  - likely dehydration  - IV fluids given, resolved.   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 and prob radiation esophagitis, continue diflucan -seen by GI consult - Dr Arlyce Dice 4/21, EGD held off due to thrombocytopenia -Dr Suanne Marker discussed plt transfusion prior to procedure with GI on 4/21>> but for now GI holding EGD, follow for improvement on difucan -Dr Suanne Marker discussed pt with Dr Michell Heinrich and she states radiation induced esophagitis very unlikely since his last chest- she states candida esophagitis more likely as he has been on high doses of dexamethasone -pt with clinical improvement, tolerating current diet ,follow -appreciate GI assistance, Esophagram with 8 cm area of esophagitis in the mid to distal esophagus in area of previous radiation with no mass or stricture noted. Continue PPI twice a day on discharge, Carafate, Magic mouthwash, and Diflucan.   Elevated LFTs -[place] with choleliathiasis, negative for cholecystitis, hepatic steatosis. F/u as outpatient.  Code Status: FULL Family Communication: Updated patient, Wife and family at bedside Disposition Plan: Home with HH in 1-2 days   Consultants:  GI - Dr Arlyce Dice 02/26/13  Procedures:  Abdominal US 02/27/13  MRI head 02/25/13  Esophagram/ Barium swallow 02/28/13  CT head 02/24/13  CXR 02/28/13, 02/24/13  Antibiotics:  Zosyn started on 4/19  levaquin 4/19>>4/20  vanc >> started 4/20>>>4/23  Diflucan started 4/22  HPI/Subjective: Eating a little better today although per wife still appears to be in pain with swallowing. Patient with no complaints. Patient wants to go home. Objective: Filed  Vitals:   02/28/13 1525 02/28/13 2109 03/01/13 0604 03/01/13 1349  BP: 133/79 128/84 116/72 131/77  Pulse: 84 78 53 73  Temp: 97.5 F (36.4 C) 97.7 F (36.5 C) 97.4 F (36.3 C) 98.1 F (36.7 C)   TempSrc: Oral Oral Oral Oral  Resp: 18 18 18 20   Height:      Weight:      SpO2: 97% 96% 92% 94%   No intake or output data in the 24 hours ending 03/01/13 1409 Filed Weights   02/24/13 1347 02/25/13 1434  Weight: 71.3 kg (157 lb 3 oz) 73.6 kg (162 lb 4.1 oz)    Exam:   General:  Alert and oriented, in NAD  Cardiovascular: RRR  Respiratory: decreased BS at bases  Abdomen: soft +BS NT/ND  Extremities: no edema, no cyanosis   Data Reviewed: Basic Metabolic Panel:  Recent Labs Lab 02/24/13 1140 02/24/13 1510 02/25/13 0531 02/26/13 0500 02/27/13 0500 02/28/13 0510 03/01/13 0630 03/01/13 0637  NA 134* 135 132* 134* 135 134* 137  --   K 3.6 3.6 3.8 3.8 3.4* 3.4* 3.4*  --   CL 96 95* 96 99 102 100 103  --   CO2 28 31 29 29 25 25 26   --   GLUCOSE 97 83 137* 132* 115* 122* 127*  --   BUN 21 21 21 23 18 18 19   --   CREATININE 0.77 0.81 0.84 0.78 0.75 0.83 0.78  --   CALCIUM 8.9 8.7 8.4 8.4 8.0* 8.5 8.2*  --   MG  --  1.7  --   --   --   --   --  1.7  PHOS  --  3.9  --   --   --   --   --   --    Liver Function Tests:  Recent Labs Lab 02/24/13 1140 02/24/13 1510 02/25/13 0531 02/28/13 0510  AST 27 24 19 20   ALT 82* 75* 62* 68*  ALKPHOS 96 88 78 88  BILITOT 0.6 0.7 0.4 0.4  PROT 5.9* 5.6* 5.1* 5.2*  ALBUMIN 1.9* 1.9* 1.7* 2.0*   No results found for this basename: LIPASE, AMYLASE,  in the last 168 hours No results found for this basename: AMMONIA,  in the last 168 hours CBC:  Recent Labs Lab 02/24/13 1140 02/24/13 1510 02/25/13 0531 02/26/13 0500 02/27/13 0500 02/28/13 0510 03/01/13 0630  WBC 3.0* 2.3* 2.1* 2.9* 3.3* 4.1 4.5  NEUTROABS 2.6 1.9  --   --   --   --   --   HGB 13.1 12.6* 11.4* 11.4* 10.2* 11.9* 11.3*  HCT 37.3* 36.3* 32.6* 32.2* 29.1* 34.1* 32.1*  MCV 88.2 88.3 87.6 87.7 87.7 87.0 87.5  PLT 29* 28* 24* 26* 29* 42* 44*   Cardiac Enzymes: No results found for this basename: CKTOTAL, CKMB, CKMBINDEX, TROPONINI,  in the last 168  hours BNP (last 3 results) No results found for this basename: PROBNP,  in the last 8760 hours CBG:  Recent Labs Lab 02/25/13 0742 02/26/13 0728 02/27/13 0740 02/28/13 0739 03/01/13 0738  GLUCAP 122* 142* 112* 112* 108*    Recent Results (from the past 240 hour(s))  CULTURE, BLOOD (ROUTINE X 2)     Status: None   Collection Time    02/24/13  1:20 PM      Result Value Range Status   Specimen Description BLOOD LEFT ARM   Final   Special Requests BOTTLES DRAWN AEROBIC ONLY 2.5 CC  Final   Culture  Setup Time 02/24/2013 17:47   Final   Culture     Final   Value: STAPHYLOCOCCUS SPECIES (COAGULASE NEGATIVE)     Note: THE SIGNIFICANCE OF ISOLATING THIS ORGANISM FROM A SINGLE SET OF BLOOD CULTURES WHEN MULTIPLE SETS ARE DRAWN IS UNCERTAIN. PLEASE NOTIFY THE MICROBIOLOGY DEPARTMENT WITHIN ONE WEEK IF SPECIATION AND SENSITIVITIES ARE REQUIRED.     Note: Gram Stain Report Called to,Read Back By and Verified With: HEATHER BULLINS 02/25/13 @ 12:38PM BY RUSCA.   Report Status 02/27/2013 FINAL   Final  CULTURE, BLOOD (ROUTINE X 2)     Status: None   Collection Time    02/24/13  1:26 PM      Result Value Range Status   Specimen Description BLOOD RIGHT ANTECUBITAL   Final   Special Requests     Final   Value: BOTTLES DRAWN AEROBIC AND ANAEROBIC 4 CC BLUE, 3 CC RED   Culture  Setup Time 02/24/2013 17:48   Final   Culture     Final   Value:        BLOOD CULTURE RECEIVED NO GROWTH TO DATE CULTURE WILL BE HELD FOR 5 DAYS BEFORE ISSUING A FINAL NEGATIVE REPORT   Report Status PENDING   Incomplete  MRSA PCR SCREENING     Status: None   Collection Time    02/24/13  5:45 PM      Result Value Range Status   MRSA by PCR NEGATIVE  NEGATIVE Final   Comment:            The GeneXpert MRSA Assay (FDA     approved for NASAL specimens     only), is one component of a     comprehensive MRSA colonization     surveillance program. It is not     intended to diagnose MRSA     infection nor to guide or      monitor treatment for     MRSA infections.     Studies: Dg Esophagus  02/28/2013  *RADIOLOGY REPORT*  Clinical Data:Dysphagia.  Esophageal pain.  The patient has had radiation therapy secondary to lung cancer, which was immediately adjacent to the esophagus in the subcarinal region on the left.  ESOPHAGUS/BARIUM SWALLOW/TABLET STUDY  Fluoroscopy Time: 1 minute 7 seconds slow pulsed fluoroscopy  Comparison: None.  Findings: There is an 8 cm segment of irregularity of the esophageal mucosa from approximately the level of the carina to just above the gastroesophageal junction consistent with esophagitis.  However, there is no stricture or mass.  A 13 mm barium tablet passed immediately through that area without delay. The cervical and upper thoracic esophagus appears normal.  There is no hiatal hernia.  IMPRESSION: 8 cm segment of esophagitis of the mid to distal esophagus in the region of the area of previous radiation.  However, there is no mass lesion or stricture.   Original Report Authenticated By: Francene Boyers, M.D.     Scheduled Meds: . amitriptyline  25 mg Oral QHS  . aspirin EC  81 mg Oral Daily  . chlorpheniramine-HYDROcodone  5 mL Oral Q12H  . citalopram  20 mg Oral QPM  . dexamethasone  4 mg Oral Q8H  . feeding supplement  1 Container Oral TID BM  . gabapentin  600 mg Oral TID  . guaiFENesin  1,200 mg Oral BID  . levETIRAcetam  500 mg Oral BID  . loratadine  10 mg Oral Daily  . magic mouthwash w/lidocaine  5 mL Oral QID  . omega-3 acid ethyl esters  1 g Oral Daily  . pantoprazole  40 mg Oral BID  . piperacillin-tazobactam (ZOSYN)  IV  3.375 g Intravenous Q8H  . polyethylene glycol  17 g Oral Daily  . sucralfate  1 g Oral Q6H  . triamcinolone cream  1 application Topical BID   Continuous Infusions: . sodium chloride Stopped (02/27/13 0536)  . 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

## 2013-03-02 ENCOUNTER — Telehealth: Payer: Self-pay | Admitting: Medical Oncology

## 2013-03-02 LAB — BASIC METABOLIC PANEL
BUN: 20 mg/dL (ref 6–23)
CO2: 27 mEq/L (ref 19–32)
Chloride: 100 mEq/L (ref 96–112)
GFR calc Af Amer: >90 mL/min (ref >90)
Glucose, Bld: 103 mg/dL — ABNORMAL HIGH (ref 70–99)
Potassium: 3.9 mEq/L (ref 3.5–5.1)

## 2013-03-02 LAB — CBC
HCT: 35.9 % — ABNORMAL LOW (ref 39.0–52.0)
Hemoglobin: 12.7 g/dL — ABNORMAL LOW (ref 13.0–17.0)
MCHC: 35.4 g/dL (ref 30.0–36.0)
MCV: 87.6 fL (ref 78.0–100.0)

## 2013-03-02 LAB — CULTURE, BLOOD (ROUTINE X 2): Culture: NO GROWTH

## 2013-03-02 MED ORDER — PANTOPRAZOLE SODIUM 40 MG PO TBEC: 40.0000 mg | DELAYED_RELEASE_TABLET | Freq: Two times a day (BID) | ORAL | Status: AC

## 2013-03-02 MED ORDER — HEPARIN SOD (PORK) LOCK FLUSH 100 UNIT/ML IV SOLN
INTRAVENOUS | Status: AC
  Filled 2013-03-02: qty 5

## 2013-03-02 MED ORDER — MAGIC MOUTHWASH W/LIDOCAINE: 10.0000 mL | Freq: Four times a day (QID) | ORAL | Status: AC

## 2013-03-02 MED ORDER — FLUCONAZOLE 100 MG PO TABS: 100.0000 mg | ORAL_TABLET | Freq: Every day | ORAL | Status: AC

## 2013-03-02 MED ORDER — BOOST / RESOURCE BREEZE PO LIQD: 1.0000 | Freq: Three times a day (TID) | ORAL | Status: DC

## 2013-03-02 MED ORDER — DEXAMETHASONE 4 MG PO TABS: 4.0000 mg | ORAL_TABLET | Freq: Three times a day (TID) | ORAL | Status: AC

## 2013-03-02 MED ORDER — SUCRALFATE 1 GM/10ML PO SUSP: 1.0000 g | Freq: Three times a day (TID) | ORAL | Status: AC

## 2013-03-02 NOTE — Progress Notes (Signed)
Physical Therapy Treatment Patient Details Name: Derrick Crosby MRN: [id number] DOB: [birth year] Today's Date: 03/02/2013 Time: 4098-1191 PT Time Calculation (min): 13 min  PT Assessment / Plan / Recommendation Comments on Treatment Session  Pt progressing well and plans to D/C to home.  has all equipment. Spouse able to assist.    Follow Up Recommendations  Home health PT;Supervision for mobility/OOB     Equipment Recommendations  None recommended by PT    Frequency Min 3X/week   Plan Discharge plan remains appropriate    Precautions / Restrictions Precautions Precautions: Fall   Pertinent Vitals/Pain Pt denies pain at this time    Mobility  Bed Mobility Bed Mobility: Not assessed Details for Bed Mobility Assistance: Pt sitting EOB on arrival Transfers Transfers: Sit to Stand;Stand to Sit Sit to Stand: 5: Supervision;4: Min guard;From bed Stand to Sit: 5: Supervision;4: Min guard;To bed Ambulation/Gait Ambulation/Gait Assistance: 4: Min guard Ambulation Distance (Feet): 60 Feet Assistive device: Rolling walker Ambulation/Gait Assistance Details: Good alternating gait with good safety cognition and balance Gait Pattern: Within Functional Limits;Step-through pattern Gait velocity: WFL Stairs: Yes Stairs Assistance: 4: Min assist Stairs Assistance Details (indicate cue type and reason): Pt not as steady with stairs this time, tho he stated that his stairs at home are shorter than the ones he practiced on. Stair Management Technique: No rails;Forwards Number of Stairs: 2       PT Goals Acute Rehab PT Goals PT Goal Formulation: With patient Time For Goal Achievement: 03/04/13 Potential to Achieve Goals: Good Pt will go Sit to Stand: with supervision PT Goal: Sit to Stand - Progress: Progressing toward goal Pt will Ambulate: >150 feet;with supervision;with least restrictive assistive device PT Goal: Ambulate - Progress: Progressing toward goal Pt will Go Up /  Down Stairs: 1-2 stairs;with min assist;with least restrictive assistive device PT Goal: Up/Down Stairs - Progress: Progressing toward goal  Visit Information  Last PT Received On: 03/02/13 Assistance Needed: +1    Cognition    Good   Balance   Good  End of Session PT - End of Session Equipment Utilized During Treatment: Gait belt Activity Tolerance: Patient tolerated treatment well Patient left: in bed;with family/visitor present;with call bell/phone within reach   GP     BROWN-SMEDLEY, NICOLE 03/02/2013, 3:07 PM  Felecia Shelling  PTA [facility]  Acute  Rehab Pager      [phone]

## 2013-03-02 NOTE — Telephone Encounter (Signed)
Will schedule per Dr Arbutus Ped recommendation.
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
## 2013-03-02 NOTE — Telephone Encounter (Signed)
pt called to r/s missed appt just got out of the hospital..Marland KitchenMarland KitchenDone

## 2013-03-02 NOTE — Discharge Summary (Signed)
Physician Discharge Summary  Derrick Crosby HYQ:[id number] DOB: February 01, 1951 DOA: 02/24/2013  PCP: Arlyss Queen  Admit date: 02/24/2013 Discharge date: 03/02/2013  Time spent: 65 minutes  Recommendations for Outpatient Follow-up:  1. Patient is to followup at his oncologist office early next week for lab draws of CBC and a comprehensive metabolic profile to followup on patient's counts, electrolytes, liver enzymes, renal function. 2. Patient is to followup with his oncologist Dr. Shirline Frees one week post discharge. 3. Patient is to followup with his PCP one week post discharge.   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 male with a PMH of metastatic non-small cell lung cancer diagnosed 06/2012, under the care of Dr. Arbutus Ped, status post systemic chemotherapy and XRT, recent admission for right arm paralysis secondary to brain metastases (in 01/22/2013) who presented to [facility] ED 02/24/2013 with sudden onset mental status changes. Patient was discharge to SNF from the previous admission and per his wife was doing relatively well, subsequently was discharged from rehab and again with pretty good overall function until last night when he suddenly did ont know who he was talking to. His wife reported he urinated on the floor thinking it was a bathroom and he could not recogine family  members. His mental status has not significantly improved on arrival to ED and patient himself is not a good historian to provide details of his present or past illness. Per wife, there was no report of chest pain, no shortness of breath. He does have a chronic cough. No fever or chills. No loss of consciousness, no slurred speech.  In ED, evaluation included CT had which showed improvement in left frontal lesion and surrounding edema. CXR was significant for left upper lobe airspace opacity. His BP was 97/47 but it improved with IV fluids to 112/74. His CBC revealed neutropenia of 3.0, platelet count of 29. Sodium is only mildly decreased to 134.   Hospital Course:  Acute encephalopathy  Patient was admitted with acute encephalopathy. Due to his history of squamous cell lung cancer with metastases to the brain was some concern about metastatic disease. Patient also noted to have a pneumonia and placed empirically on IV antibiotics. Patient underwent CT of the head that shows significant improvement in the left frontal lesion and surrounding edema with no acute infarction or hemorrhage. Patient underwent MRI of the brain that showed regression of solitary left superior frontal gyrus brain metastases significantly reduced edema and no significant mass effect at that time. There was no additional metastatic disease or new intracranial abnormality identified. Dr. Suanne Marker discussed with patient's oncologist Dr. Shirline Frees and it was felt that patient's acute encephalopathy was likely secondary to infectious etiology of his pneumonia. Patient was maintained on Decadron. Patient improved clinically and was back to his baseline by day of discharge. Patient will be discharged in  stable and improved condition.  Squamous cell lung cancer/metastases to the brain with right upper extremity paralysis Remained stable during the hospitalization. CT of the head and MRI of the head showed improvement in metastatic disease.  Patient was maintained on Decadron. Patient will followup with his oncologist Dr. Shirline Frees as outpatient.  Healthcare associated pneumonia On admission patient was noted per chest x-ray to have a left upper lobe airspace opacity. On admission patient was noted to be neutropenic with a white count of 2.3. Patient was empirically started on IV vancomycin IV Zosyn and IV Levaquin. Blood cultures which were obtained came back with one out of two gram-positive cocci in clusters/coag negative staph which was felt to be likely a contaminant. Patient's vancomycin was discontinued. Patient was maintained on IV Zosyn. Patient received 7 days of antibiotic treatment during the hospitalization with discharge home in stable and improved condition.  Odynophagia/radiation esophagitis Patient during the hospitalization had some complaints of odynophagia and dysphagia since starting his radiation and chemotherapy with worsening of his symptoms. There was some concern that patient likely had an esophagitis likely radiation induced in addition to peptic esophagitis and possible Candida esophagitis as patient had been on Decadron. Patient was placed on PPI twice daily, Carafate slurry, Magic mouthwash with lidocaine, and Diflucan. GI consultation was obtained and patient was seen in consultation by Dr. Arlyce Dice 02/26/2013. Due to patient's thrombocytopenia and upper endoscopy was not done. Patient was followed and improved clinically with treatment as stated above. Patient underwent an esophagram which showed 8 cm area of esophagitis in the mid to distal esophagus in the area of previous radiation with no mass or stricture noted. Patient was discharged home with Magic mouthwash with lidocaine, Carafate slurry, and 8 more days of oral Diflucan to complete a ten-day course of therapy. Patient was discharged in stable and improved condition.  Thrombocytopenia On admission patient was noted to be thrombocytopenic with platelet count  of 29. Patient did not have any bleeding episodes during the hospitalization. It was felt patient's thrombocytopenia was likely secondary to an infectious etiology. Antiplatelet agents were discontinued and patient was treated empirically with IV antibiotics as well as antifungal agent. Patient's platelet count slowly improved during the hospitalization such that by day of discharge his platelet count was 54. Patient will followup at his oncologist office as outpatient for lab work and hospital followup.  Transaminitis Patient on admission was noted to have a transaminitis. Ultrasound which was done showed cholelithiasis and was negative for cholecystitis. He did also show hepatic steatosis. Patient remained asymptomatic and patient will need to followup as outpatient.  The rest of patient's chronic medical issues remained stable throughout the hospitalization the patient will be discharged in stable and improved condition.          Procedures: Abdominal US 02/27/13  MRI head 02/25/13  Esophagram/ Barium swallow 02/28/13  CT head 02/24/13  CXR 02/28/13, 02/24/13   Consultations: GI - Dr Arlyce Dice 02/26/13     Discharge Exam: Filed Vitals:   03/01/13 2147 03/02/13 0527 03/02/13 0630 03/02/13 1347  BP: 152/88 151/74  140/95  Pulse: 90 59  67  Temp: 97.8 F (36.6 C) 97.3 F (36.3 C)  98 F (36.7 C)  TempSrc: Oral Oral    Resp: 16 16  18   Height:      Weight:   67.1 kg (147 lb 14.9 oz)   SpO2: 96% 98%  100%    General: NAD Cardiovascular: RRR Respiratory: CTAB  Discharge Instructions  Discharge  Orders   Future Appointments Provider Department Dept Phone   03/15/2013 2:00 PM Lurline Hare, MD [facility] [phone]   Future Orders Complete By Expires     Diet general  As directed     Comments:      Dysphagia 2 diet    Discharge instructions  As directed     Comments:      Follow up next week at Dr Shirline Frees office for CBC. Follow up with  Dr Shirline Frees in 1 week Follow up with CONROY,NATHAN, PA-C in 1 week.    Increase activity slowly  As directed         Medication List    STOP taking these medications       aspirin EC 81 MG tablet     guaiFENesin 600 MG 12 hr tablet  Commonly known as:  MUCINEX     sucralfate 1 G tablet  Commonly known as:  CARAFATE      TAKE these medications       acetaminophen 325 MG tablet  Commonly known as:  TYLENOL  Take 650 mg by mouth every 6 (six) hours as needed. For general aches     albuterol 108 (90 BASE) MCG/ACT inhaler  Commonly known as:  PROVENTIL HFA;VENTOLIN HFA  Inhale 2 puffs into the lungs every 6 (six) hours as needed for wheezing.     alum & mag hydroxide-simeth 200-200-20 MG/5ML suspension  Commonly known as:  MAALOX/MYLANTA  Take 30 mLs by mouth every 6 (six) hours as needed.     amitriptyline 25 MG tablet  Commonly known as:  ELAVIL  Take 25 mg by mouth at bedtime.     chlorproMAZINE 10 MG tablet  Commonly known as:  THORAZINE  Take 1 tablet (10 mg total) by mouth 4 (four) times daily as needed.     citalopram 20 MG tablet  Commonly known as:  CELEXA  Take 1 tablet (20 mg total) by mouth every evening.     clonazePAM 0.5 MG tablet  Commonly known as:  KLONOPIN  Take 1 tablet (0.5 mg total) by mouth 2 (two) times daily as needed for anxiety. For anxiety     dexamethasone 4 MG tablet  Commonly known as:  DECADRON  Take 1 tablet (4 mg total) by mouth 3 (three) times daily.     feeding supplement Liqd  Take 1 Container by mouth 3 (three) times daily between meals.     fluconazole 100 MG tablet  Commonly known as:  DIFLUCAN  Take 1 tablet (100 mg total) by mouth daily. Take for 8 days.     gabapentin 300 MG capsule  Commonly known as:  NEURONTIN  Take 600 mg by mouth 3 (three) times daily.     HYDROcodone-homatropine 5-1.5 MG/5ML syrup  Commonly known as:  HYCODAN  Take 5 mLs by mouth every 6 (six) hours as needed for cough.     levETIRAcetam  100 MG/ML solution  Commonly known as:  KEPPRA  Take 5 mLs (500 mg total) by mouth 2 (two) times daily.     lidocaine-prilocaine cream  Commonly known as:  EMLA  Apply 1 application topically as needed. Apply to [facility] A cath site 30-60 min before chemotherapy. Used every Wednesday     loratadine 10 MG tablet  Commonly known as:  CLARITIN  Take 10 mg by mouth daily.     magic mouthwash w/lidocaine Soln  Take 10 mLs by mouth 4 (four) times daily.  Take for 10 days.     omega-3 acid ethyl esters 1 G capsule  Commonly known as:  LOVAZA  Take 1 capsule (1 g total) by mouth daily.     pantoprazole 40 MG tablet  Commonly known as:  PROTONIX  Take 1 tablet (40 mg total) by mouth 2 (two) times daily.     polyethylene glycol packet  Commonly known as:  MIRALAX / GLYCOLAX  Take 17 g by mouth at bedtime.     sucralfate 1 GM/10ML suspension  Commonly known as:  CARAFATE  Take 10 mLs (1 g total) by mouth 4 (four) times daily -  with meals and at bedtime.     triamcinolone cream 0.1 %  Commonly known as:  KENALOG  Apply 1 application topically 2 (two) times daily. Apply to nose and face for dry skin           Follow-up Information   Follow up with [facility] K., MD. Schedule an appointment as soon as possible for a visit in 1 week.   Contact information:   [address] [place] [address] [phone]       Follow up with [facility]. Schedule an appointment as soon as possible for a visit in 1 week. (f/u early next week for labwork of CBC, BMET)       Follow up with CONROY,NATHAN, PA-C. Schedule an appointment as soon as possible for a visit in 1 week.   Contact information:   [facility] [address] [place] [address] [phone]        The results of significant diagnostics from this hospitalization (including imaging, microbiology, ancillary and laboratory) are listed below for reference.    Significant Diagnostic Studies: Dg  Chest 2 View  02/24/2013  *RADIOLOGY REPORT*  Clinical Data: Cough.  History of lung cancer.  CHEST - 2 VIEW  Comparison: Chest CT 11/27/2012  Findings: Airspace opacity noted in the left upper lobe.  It is unclear if this could represent postradiation changes or area of infection/pneumonia.  No confluent opacity on the right.  Right Port-A-Cath remains in place, unchanged.  Heart is normal size.  No visible effusions.  IMPRESSION: Left upper lobe airspace opacity.  Recommend correlation for any interval radiation to this area has this could represent radiation pneumonitis.  Cannot exclude pneumonia.   Original Report Authenticated By: Charlett Nose, M.D.    Ct Head Wo Contrast  02/24/2013  *RADIOLOGY REPORT*  Clinical Data: Confusion.  Metastatic lung cancer.  CT HEAD WITHOUT CONTRAST  Technique:  Contiguous axial images were obtained from the base of the skull through the vertex without contrast.  Comparison: CT and MRI 01/22/2013  Findings: The previously seen left frontal lesion appears improved with near complete resolution of the left frontal edema.  No visible mass currently on this unenhanced CT.  No hemorrhage, hydrocephalus or acute infarction.  Small air-fluid levels in the maxillary sinuses.  Diffuse ethmoid mucosal thickening.  Mastoids are clear.  IMPRESSION: Significant improvement in the left frontal lesion and surrounding edema.  No acute infarction or hemorrhage.   Original Report Authenticated By: Charlett Nose, M.D.    Mr Laqueta Jean Wo Contrast  02/25/2013  *RADIOLOGY REPORT*  Clinical Data: 62 year old male with metastatic lung cancer. Solitary brain metastasis detected on 01/22/2013.  Confusion. Restaging.  MRI HEAD WITHOUT AND WITH CONTRAST  Technique:  Multiplanar, multiecho pulse sequences of the brain and surrounding structures were obtained according to standard protocol without and  with intravenous contrast  Contrast: 14mL MULTIHANCE GADOBENATE DIMEGLUMINE 529 MG/ML IV SOLN  Comparison:  01/22/2013 and earlier.  Findings: Regressed left superior frontal gyrus enhancing metastasis, enhancing component now measuring up to 16 x 16 mm transaxially, previously 32 x 22 mm.  No additional brain parenchymal metastasis identified.  Significantly reduced edema surrounding the known metastasis. Stable gray and white matter signal elsewhere.  Currently no significant mass effect. No restricted diffusion to suggest acute infarction.  Major intracranial vascular flow voids are stable. No acute intracranial hemorrhage identified.  Negative pituitary, cervicomedullary junction, and visible cervical spine.  The normal bone marrow signal.  Visualized orbit soft tissues are within normal limits.  Increased paranasal sinus mucosal thickening and fluid levels.  Small left mastoid effusion is new.  Negative nasopharynx.  Negative scalp soft tissues.  IMPRESSION: 1.  Regression of solitary left superior frontal gyrus brain metastasis.  Significantly reduced edema and no significant mass effect at this time. 2.  No additional metastatic disease or new intracranial abnormality identified.   Original Report Authenticated By: Erskine Speed, M.D.    US Abdomen Complete  02/27/2013  *RADIOLOGY REPORT*  Clinical Data:    Elevated liver function tests.  Worsening thrombocytopenia.  The  COMPLETE ABDOMINAL ULTRASOUND  Comparison:  CT abdomen and pelvis 11/27/2012.  Findings:  Gallbladder:  Multiple small stones are present.  Gallbladder wall thickening is noted up to 4.5 mm.  Negative sonographic Murphy's sign.  Common bile duct:  Normal measuring 4.0 mm.  Liver:  Increased echogenicity. No intrahepatic biliary ductal dilatation.Multiple cysts are demonstrated consistent with the CT appearance.  No solid masses.  IVC:  Appears normal.  Pancreas:  Not well visualized due to bowel gas.  Spleen:  Normal measuring 8.2 cm.  Right Kidney:  Normal measuring 10.1 cm.  Left Kidney:  Normal measuring 11.6 cm.  Abdominal aorta:  No aneurysm  identified.  IMPRESSION: Cholelithiasis.  Gallbladder wall thickening up to 4.5 mm.  No right upper quadrant tenderness or biliary ductal dilatation. Hepatic steatosis without visible metastatic disease.   Original Report Authenticated By: Davonna Belling, M.D.    Dg Esophagus  02/28/2013  *RADIOLOGY REPORT*  Clinical Data:Dysphagia.  Esophageal pain.  The patient has had radiation therapy secondary to lung cancer, which was immediately adjacent to the esophagus in the subcarinal region on the left.  ESOPHAGUS/BARIUM SWALLOW/TABLET STUDY  Fluoroscopy Time: 1 minute 7 seconds slow pulsed fluoroscopy  Comparison: None.  Findings: There is an 8 cm segment of irregularity of the esophageal mucosa from approximately the level of the carina to just above the gastroesophageal junction consistent with esophagitis.  However, there is no stricture or mass.  A 13 mm barium tablet passed immediately through that area without delay. The cervical and upper thoracic esophagus appears normal.  There is no hiatal hernia.  IMPRESSION: 8 cm segment of esophagitis of the mid to distal esophagus in the region of the area of previous radiation.  However, there is no mass lesion or stricture.   Original Report Authenticated By: Francene Boyers, M.D.     Microbiology: Recent Results (from the past 240 hour(s))  CULTURE, BLOOD (ROUTINE X 2)     Status: None   Collection Time    02/24/13  1:20 PM      Result Value Range Status   Specimen Description BLOOD LEFT ARM   Final   Special Requests BOTTLES DRAWN AEROBIC ONLY 2.5 CC    Final   Culture  Setup Time 02/24/2013  17:47   Final   Culture     Final   Value: STAPHYLOCOCCUS SPECIES (COAGULASE NEGATIVE)     Note: THE SIGNIFICANCE OF ISOLATING THIS ORGANISM FROM A SINGLE SET OF BLOOD CULTURES WHEN MULTIPLE SETS ARE DRAWN IS UNCERTAIN. PLEASE NOTIFY THE MICROBIOLOGY DEPARTMENT WITHIN ONE WEEK IF SPECIATION AND SENSITIVITIES ARE REQUIRED.     Note: Gram Stain Report Called to,Read Back  By and Verified With: HEATHER BULLINS 02/25/13 @ 12:38PM BY RUSCA.   Report Status 02/27/2013 FINAL   Final  CULTURE, BLOOD (ROUTINE X 2)     Status: None   Collection Time    02/24/13  1:26 PM      Result Value Range Status   Specimen Description BLOOD RIGHT ANTECUBITAL   Final   Special Requests     Final   Value: BOTTLES DRAWN AEROBIC AND ANAEROBIC 4 CC BLUE, 3 CC RED   Culture  Setup Time 02/24/2013 17:48   Final   Culture NO GROWTH 5 DAYS   Final   Report Status 03/02/2013 FINAL   Final  MRSA PCR SCREENING     Status: None   Collection Time    02/24/13  5:45 PM      Result Value Range Status   MRSA by PCR NEGATIVE  NEGATIVE Final   Comment:            The GeneXpert MRSA Assay (FDA     approved for NASAL specimens     only), is one component of a     comprehensive MRSA colonization     surveillance program. It is not     intended to diagnose MRSA     infection nor to guide or     monitor treatment for     MRSA infections.     Labs: Basic Metabolic Panel:  Recent Labs Lab 02/24/13 1140 02/24/13 1510  02/26/13 0500 02/27/13 0500 02/28/13 0510 03/01/13 0630 03/01/13 0637 03/02/13 0545  NA 134* 135  < > 134* 135 134* 137  --  136  K 3.6 3.6  < > 3.8 3.4* 3.4* 3.4*  --  3.9  CL 96 95*  < > 99 102 100 103  --  100  CO2 28 31  < > 29 25 25 26   --  27  GLUCOSE 97 83  < > 132* 115* 122* 127*  --  103*  BUN 21 21  < > 23 18 18 19   --  20  CREATININE 0.77 0.81  < > 0.78 0.75 0.83 0.78  --  0.85  CALCIUM 8.9 8.7  < > 8.4 8.0* 8.5 8.2*  --  8.6  MG  --  1.7  --   --   --   --   --  1.7  --   PHOS  --  3.9  --   --   --   --   --   --   --   < > = values in this interval not displayed. Liver Function Tests:  Recent Labs Lab 02/24/13 1140 02/24/13 1510 02/25/13 0531 02/28/13 0510  AST 27 24 19 20   ALT 82* 75* 62* 68*  ALKPHOS 96 88 78 88  BILITOT 0.6 0.7 0.4 0.4  PROT 5.9* 5.6* 5.1* 5.2*  ALBUMIN 1.9* 1.9* 1.7* 2.0*   No results found for this basename: LIPASE,  AMYLASE,  in the last 168 hours No results found for this basename: AMMONIA,  in the last 168 hours CBC:  Recent Labs Lab 02/24/13 1140 02/24/13 1510  02/26/13 0500 02/27/13 0500 02/28/13 0510 03/01/13 0630 03/02/13 0545  WBC 3.0* 2.3*  < > 2.9* 3.3* 4.1 4.5 6.1  NEUTROABS 2.6 1.9  --   --   --   --   --   --   HGB 13.1 12.6*  < > 11.4* 10.2* 11.9* 11.3* 12.7*  HCT 37.3* 36.3*  < > 32.2* 29.1* 34.1* 32.1* 35.9*  MCV 88.2 88.3  < > 87.7 87.7 87.0 87.5 87.6  PLT 29* 28*  < > 26* 29* 42* 44* 54*  < > = values in this interval not displayed. Cardiac Enzymes: No results found for this basename: CKTOTAL, CKMB, CKMBINDEX, TROPONINI,  in the last 168 hours BNP: BNP (last 3 results) No results found for this basename: PROBNP,  in the last 8760 hours CBG:  Recent Labs Lab 02/26/13 0728 02/27/13 0740 02/28/13 0739 03/01/13 0738 03/02/13 0734  GLUCAP 142* 112* 112* 108* 100*       Signed:  Starasia Sinko  [facility] 03/02/2013, 2:59 PM

## 2013-03-05 ENCOUNTER — Other Ambulatory Visit: Payer: Self-pay | Admitting: Medical Oncology

## 2013-03-05 DIAGNOSIS — C3400 Malignant neoplasm of unspecified main bronchus: Secondary | ICD-10-CM

## 2013-03-05 NOTE — Telephone Encounter (Addendum)
Per Dr. Arbutus Ped he wants PCP to refill his rx. Addendum _ ( wife called yesterday and wants refills for gabapentin, keppra, and hycodan)

## 2013-03-05 NOTE — Telephone Encounter (Signed)
I called PCP and he will refill rx s

## 2013-03-06 ENCOUNTER — Other Ambulatory Visit: Payer: Self-pay | Admitting: Medical Oncology

## 2013-03-06 ENCOUNTER — Telehealth: Payer: Self-pay | Admitting: Internal Medicine

## 2013-03-06 ENCOUNTER — Ambulatory Visit (HOSPITAL_BASED_OUTPATIENT_CLINIC_OR_DEPARTMENT_OTHER): Payer: BC Managed Care – PPO | Admitting: Internal Medicine

## 2013-03-06 ENCOUNTER — Other Ambulatory Visit (HOSPITAL_BASED_OUTPATIENT_CLINIC_OR_DEPARTMENT_OTHER): Payer: BC Managed Care – PPO | Admitting: Lab

## 2013-03-06 ENCOUNTER — Encounter: Payer: Self-pay | Admitting: Internal Medicine

## 2013-03-06 VITALS — BP 122/87 | HR 69 | Temp 97.4°F | Resp 18 | Ht 74.0 in | Wt 147.8 lb

## 2013-03-06 DIAGNOSIS — C3410 Malignant neoplasm of upper lobe, unspecified bronchus or lung: Secondary | ICD-10-CM

## 2013-03-06 DIAGNOSIS — C7931 Secondary malignant neoplasm of brain: Secondary | ICD-10-CM

## 2013-03-06 DIAGNOSIS — C3400 Malignant neoplasm of unspecified main bronchus: Secondary | ICD-10-CM

## 2013-03-06 DIAGNOSIS — B370 Candidal stomatitis: Secondary | ICD-10-CM

## 2013-03-06 DIAGNOSIS — C3490 Malignant neoplasm of unspecified part of unspecified bronchus or lung: Secondary | ICD-10-CM

## 2013-03-06 LAB — TECHNOLOGIST REVIEW

## 2013-03-06 LAB — CBC WITH DIFFERENTIAL/PLATELET
BASO%: 0.2 % (ref 0.0–2.0)
Eosinophils Absolute: 0 10*3/uL (ref 0.0–0.5)
HCT: 37.8 % — ABNORMAL LOW (ref 38.4–49.9)
LYMPH%: 8.5 % — ABNORMAL LOW (ref 14.0–49.0)
MCHC: 34.3 g/dL (ref 32.0–36.0)
MCV: 90.2 fL (ref 79.3–98.0)
MONO#: 0.6 10*3/uL (ref 0.1–0.9)
NEUT%: 85.9 % — ABNORMAL HIGH (ref 39.0–75.0)
Platelets: 84 10*3/uL — ABNORMAL LOW (ref 140–400)
WBC: 10.6 10*3/uL — ABNORMAL HIGH (ref 4.0–10.3)

## 2013-03-06 LAB — COMPREHENSIVE METABOLIC PANEL (CC13)
CO2: 26 mEq/L (ref 22–29)
Creatinine: 0.8 mg/dL (ref 0.7–1.3)
Glucose: 114 mg/dl — ABNORMAL HIGH (ref 70–99)
Total Bilirubin: 0.44 mg/dL (ref 0.20–1.20)

## 2013-03-06 NOTE — Patient Instructions (Signed)
I will repeat CT scan of the chest, abdomen and pelvis for evaluation your disease. Followup visit in one week

## 2013-03-06 NOTE — Progress Notes (Signed)
[facility] Telephone:([phone]) [phone]   [phone]) [phone]  OFFICE PROGRESS NOTE  [facility] [address] [place] [address]  DIAGNOSIS: Metastatic non-small cell lung cancer, squamous cell carcinoma diagnosed in August of 2013.   PRIOR THERAPY:  1) status post palliative radiotherapy to the left hilar mass under the care of Dr. Mitzi Hansen.  2) Systemic chemotherapy with carboplatin for AUC of 5 on day 1 and Abraxane 100 mg/M2 on days 1, 8 and 15 every 3 weeks. The patient is status post 4 cycles and day 1 and 8 of cycle 5 3) Whole brain irradiation under the care of Dr. Michell Heinrich completed on 02/13/2013  CURRENT THERAPY: Observation.  INTERVAL HISTORY: Derrick Crosby 62 y.o. male returns to the clinic today for followup visit accompanied by his wife and sister-in-law. The patient was admitted recently to [facility] with mental status change after completion of her brain irradiation for brain metastasis. MRI of the brain performed during his hospitalization showed improvement in the metastatic brain lesions. He was also found to have thrombocytopenia and treated for questionable aspiration pneumonia with Zosyn and Levaquin. He was also complaining of dysphagia and odynophagia secondary to previous radiotherapy induced esophagitis. He was seen by Dr. Arlyce Dice and was started on treatment with pump inhibitors twice a day in addition to Carafate. He was supposed to have repeat CT scan of the chest, abdomen and pelvis for restaging of his disease but because of his recent hospitalization this was not done.  He is feeling a little bit better today. The patient denied having any significant chest pain, shortness breath, cough or hemoptysis. He denied having any nausea or vomiting. He is still confused at times.   MEDICAL HISTORY: Past Medical History  Diagnosis Date  . Hypertension   . Hyperglyceridemia, pure   . COPD (chronic  obstructive pulmonary disease)   . Hemoptysis   . Anxiety   . Depression   . Hemoptysis   . Seasonal allergies   . History of radiation therapy 07/05/12-07/26/12    lung ca   . Cancer     Skin  Cancer - Left ear.-  . Skin cancer     left ear  . Lung cancer 06/13/12    lung mass /right hilar suspicious for primary bronchogenic ca  . 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  MEDICATIONS:  Current Outpatient Prescriptions  Medication Sig Dispense Refill  . acetaminophen (TYLENOL) 325 MG tablet Take 650 mg by mouth every 6 (six) hours as needed. For general aches      . albuterol (PROVENTIL HFA;VENTOLIN HFA) 108 (90 BASE) MCG/ACT inhaler Inhale 2 puffs into the lungs every 6 (six) hours as needed for wheezing.       Marland Kitchen alum & mag hydroxide-simeth (MAALOX/MYLANTA) 200-200-20 MG/5ML suspension Take 30 mLs by mouth every 6 (six) hours as needed.  355 mL  0  . Alum & Mag Hydroxide-Simeth (MAGIC MOUTHWASH W/LIDOCAINE) SOLN Take 10 mLs by mouth 4 (four) times daily. Take for 10 days.  400 mL  0  . amitriptyline (ELAVIL) 25 MG tablet Take 25 mg by mouth at bedtime.      . chlorproMAZINE (THORAZINE) 10 MG tablet Take 1 tablet (10 mg total) by mouth 4 (four) times daily as needed.  90 tablet  0  . citalopram (CELEXA) 20 MG tablet Take 1 tablet (20 mg total) by  mouth every evening.  30 tablet  0  . clonazePAM (KLONOPIN) 0.5 MG tablet Take 1 tablet (0.5 mg total) by mouth 2 (two) times daily as needed for anxiety. For anxiety  30 tablet  0  . dexamethasone (DECADRON) 4 MG tablet Take 1 tablet (4 mg total) by mouth 3 (three) times daily.  90 tablet  0  . feeding supplement (RESOURCE BREEZE) LIQD Take 1 Container by mouth 3 (three) times daily between meals.      . fluconazole (DIFLUCAN) 100 MG tablet Take 1 tablet (100 mg total) by mouth daily. Take for 8 days.  10 tablet  0  . gabapentin (NEURONTIN) 300 MG capsule Take 600 mg by mouth 3 (three) times daily.        Marland Kitchen HYDROcodone-homatropine (HYCODAN) 5-1.5 MG/5ML syrup Take 5 mLs by mouth every 6 (six) hours as needed for cough.  240 mL  0  . levETIRAcetam (KEPPRA) 100 MG/ML solution Take 5 mLs (500 mg total) by mouth 2 (two) times daily.  473 mL  0  . lidocaine-prilocaine (EMLA) cream Apply 1 application topically as needed. Apply to [facility] A cath site 30-60 min before chemotherapy. Used every Wednesday      . loratadine (CLARITIN) 10 MG tablet Take 10 mg by mouth daily.      Marland Kitchen omega-3 acid ethyl esters (LOVAZA) 1 G capsule Take 1 capsule (1 g total) by mouth daily.  30 capsule  0  . pantoprazole (PROTONIX) 40 MG tablet Take 1 tablet (40 mg total) by mouth 2 (two) times daily.  62 tablet  0  . polyethylene glycol (MIRALAX / GLYCOLAX) packet Take 17 g by mouth at bedtime.       . sucralfate (CARAFATE) 1 GM/10ML suspension Take 10 mLs (1 g total) by mouth 4 (four) times daily -  with meals and at bedtime.  420 mL  0  . triamcinolone cream (KENALOG) 0.1 % Apply 1 application topically 2 (two) times daily. Apply to nose and face for dry skin       No current facility-administered medications for this visit.    SURGICAL HISTORY:  Past Surgical History  Procedure Laterality Date  . Tympanoplasty    . Video bronchoscopy  06/13/12    with endobronchial u/s/ with biospy left hilar mass/Dr. Lonia Farber  . Tonsillectomy      child    REVIEW OF SYSTEMS:  A comprehensive review of systems was negative except for: Constitutional: positive for anorexia, fatigue and weight loss Musculoskeletal: positive for muscle weakness   PHYSICAL EXAMINATION: General appearance: alert, cooperative, fatigued and no distress Head: Normocephalic, without obvious abnormality, atraumatic Neck: no adenopathy Lymph nodes: Cervical, supraclavicular, and axillary nodes normal. Resp: clear to auscultation bilaterally Cardio: regular rate and rhythm, S1, S2 normal, no murmur, click, rub or gallop GI: soft, non-tender;  bowel sounds normal; no masses,  no organomegaly Extremities: extremities normal, atraumatic, no cyanosis or edema Neurologic: Alert and oriented X 3, normal strength and tone. Normal symmetric reflexes. Normal coordination and gait  ECOG PERFORMANCE STATUS: 2 - Symptomatic, <50% confined to bed  Blood pressure 122/87, pulse 69, temperature 97.4 F (36.3 C), temperature source Oral, resp. rate 18, height 6\' 2"  (1.88 m), weight 147 lb 12.8 oz (67.042 kg), SpO2 96.00%.  LABORATORY DATA: Lab Results  Component Value Date   WBC 10.6* 03/06/2013   HGB 13.0 03/06/2013   HCT 37.8* 03/06/2013   MCV 90.2 03/06/2013   PLT 84* 03/06/2013  Chemistry      Component Value Date/Time   NA 135* 03/06/2013 1001   NA 136 03/02/2013 0545   K 4.6 03/06/2013 1001   K 3.9 03/02/2013 0545   CL 100 03/06/2013 1001   CL 100 03/02/2013 0545   CO2 26 03/06/2013 1001   CO2 27 03/02/2013 0545   BUN 20.9 03/06/2013 1001   BUN 20 03/02/2013 0545   CREATININE 0.8 03/06/2013 1001   CREATININE 0.85 03/02/2013 0545      Component Value Date/Time   CALCIUM 8.8 03/06/2013 1001   CALCIUM 8.6 03/02/2013 0545   ALKPHOS 101 03/06/2013 1001   ALKPHOS 88 02/28/2013 0510   AST 17 03/06/2013 1001   AST 20 02/28/2013 0510   ALT 65* 03/06/2013 1001   ALT 68* 02/28/2013 0510   BILITOT 0.44 03/06/2013 1001   BILITOT 0.4 02/28/2013 0510       RADIOGRAPHIC STUDIES: Dg Chest 2 View  02/24/2013  *RADIOLOGY REPORT*  Clinical Data: Cough.  History of lung cancer.  CHEST - 2 VIEW  Comparison: Chest CT 11/27/2012  Findings: Airspace opacity noted in the left upper lobe.  It is unclear if this could represent postradiation changes or area of infection/pneumonia.  No confluent opacity on the right.  Right Port-A-Cath remains in place, unchanged.  Heart is normal size.  No visible effusions.  IMPRESSION: Left upper lobe airspace opacity.  Recommend correlation for any interval radiation to this area has this could represent radiation pneumonitis.   Cannot exclude pneumonia.   Original Report Authenticated By: Charlett Nose, M.D.    Ct Head Wo Contrast  02/24/2013  *RADIOLOGY REPORT*  Clinical Data: Confusion.  Metastatic lung cancer.  CT HEAD WITHOUT CONTRAST  Technique:  Contiguous axial images were obtained from the base of the skull through the vertex without contrast.  Comparison: CT and MRI 01/22/2013  Findings: The previously seen left frontal lesion appears improved with near complete resolution of the left frontal edema.  No visible mass currently on this unenhanced CT.  No hemorrhage, hydrocephalus or acute infarction.  Small air-fluid levels in the maxillary sinuses.  Diffuse ethmoid mucosal thickening.  Mastoids are clear.  IMPRESSION: Significant improvement in the left frontal lesion and surrounding edema.  No acute infarction or hemorrhage.   Original Report Authenticated By: Charlett Nose, M.D.    Mr Laqueta Jean Wo Contrast  02/25/2013  *RADIOLOGY REPORT*  Clinical Data: 62 year old male with metastatic lung cancer. Solitary brain metastasis detected on 01/22/2013.  Confusion. Restaging.  MRI HEAD WITHOUT AND WITH CONTRAST  Technique:  Multiplanar, multiecho pulse sequences of the brain and surrounding structures were obtained according to standard protocol without and with intravenous contrast  Contrast: 14mL MULTIHANCE GADOBENATE DIMEGLUMINE 529 MG/ML IV SOLN  Comparison: 01/22/2013 and earlier.  Findings: Regressed left superior frontal gyrus enhancing metastasis, enhancing component now measuring up to 16 x 16 mm transaxially, previously 32 x 22 mm.  No additional brain parenchymal metastasis identified.  Significantly reduced edema surrounding the known metastasis. Stable gray and white matter signal elsewhere.  Currently no significant mass effect. No restricted diffusion to suggest acute infarction.  Major intracranial vascular flow voids are stable. No acute intracranial hemorrhage identified.  Negative pituitary, cervicomedullary  junction, and visible cervical spine.  The normal bone marrow signal.  Visualized orbit soft tissues are within normal limits.  Increased paranasal sinus mucosal thickening and fluid levels.  Small left mastoid effusion is new.  Negative nasopharynx.  Negative scalp soft tissues.  IMPRESSION: 1.  Regression of solitary left superior frontal gyrus brain metastasis.  Significantly reduced edema and no significant mass effect at this time. 2.  No additional metastatic disease or new intracranial abnormality identified.   Original Report Authenticated By: Erskine Speed, M.D.    US Abdomen Complete  02/27/2013  *RADIOLOGY REPORT*  Clinical Data:    Elevated liver function tests.  Worsening thrombocytopenia.  The  COMPLETE ABDOMINAL ULTRASOUND  Comparison:  CT abdomen and pelvis 11/27/2012.  Findings:  Gallbladder:  Multiple small stones are present.  Gallbladder wall thickening is noted up to 4.5 mm.  Negative sonographic Murphy's sign.  Common bile duct:  Normal measuring 4.0 mm.  Liver:  Increased echogenicity. No intrahepatic biliary ductal dilatation.Multiple cysts are demonstrated consistent with the CT appearance.  No solid masses.  IVC:  Appears normal.  Pancreas:  Not well visualized due to bowel gas.  Spleen:  Normal measuring 8.2 cm.  Right Kidney:  Normal measuring 10.1 cm.  Left Kidney:  Normal measuring 11.6 cm.  Abdominal aorta:  No aneurysm identified.  IMPRESSION: Cholelithiasis.  Gallbladder wall thickening up to 4.5 mm.  No right upper quadrant tenderness or biliary ductal dilatation. Hepatic steatosis without visible metastatic disease.   Original Report Authenticated By: Davonna Belling, M.D.    Dg Esophagus  02/28/2013  *RADIOLOGY REPORT*  Clinical Data:Dysphagia.  Esophageal pain.  The patient has had radiation therapy secondary to lung cancer, which was immediately adjacent to the esophagus in the subcarinal region on the left.  ESOPHAGUS/BARIUM SWALLOW/TABLET STUDY  Fluoroscopy Time: 1 minute 7  seconds slow pulsed fluoroscopy  Comparison: None.  Findings: There is an 8 cm segment of irregularity of the esophageal mucosa from approximately the level of the carina to just above the gastroesophageal junction consistent with esophagitis.  However, there is no stricture or mass.  A 13 mm barium tablet passed immediately through that area without delay. The cervical and upper thoracic esophagus appears normal.  There is no hiatal hernia.  IMPRESSION: 8 cm segment of esophagitis of the mid to distal esophagus in the region of the area of previous radiation.  However, there is no mass lesion or stricture.   Original Report Authenticated By: Francene Boyers, M.D.     ASSESSMENT: This is a very pleasant 62 years old white male with metastatic non-small cell lung cancer with recent multiple brain metastasis status post whole brain irradiation.  PLAN: The patient is to have significant weakness and fatigue as well as oral thrush. He is still on a taper dose of Decadron.  I will repeat the CT scan of the chest, abdomen and pelvis in few days for evaluation of his disease. For the oral thrush, I will start the patient on Diflucan 100 mg by mouth daily. The patient would come back for followup visit in one week for evaluation and discussion of his scan results and recommendation regarding treatment of his metastatic lung cancer. He was advised to call immediately if he has any concerning symptoms in the interval. All questions were answered. The patient knows to call the clinic with any problems, questions or concerns. We can certainly see the patient much sooner if necessary.  I spent 15 minutes counseling the patient face to face. The total time spent in the appointment was 25 minutes.

## 2013-03-07 ENCOUNTER — Ambulatory Visit (HOSPITAL_COMMUNITY)
Admission: RE | Admit: 2013-03-07 | Discharge: 2013-03-07 | Disposition: A | Discharge status: Home / Self Care | Payer: BC Managed Care – PPO | Source: Ambulatory Visit | Attending: Internal Medicine | Admitting: Internal Medicine

## 2013-03-07 DIAGNOSIS — J479 Bronchiectasis, uncomplicated: Secondary | ICD-10-CM | POA: Insufficient documentation

## 2013-03-07 DIAGNOSIS — K2289 Other specified disease of esophagus: Secondary | ICD-10-CM | POA: Insufficient documentation

## 2013-03-07 DIAGNOSIS — J438 Other emphysema: Secondary | ICD-10-CM | POA: Insufficient documentation

## 2013-03-07 DIAGNOSIS — I7090 Unspecified atherosclerosis: Secondary | ICD-10-CM | POA: Insufficient documentation

## 2013-03-07 DIAGNOSIS — R0602 Shortness of breath: Secondary | ICD-10-CM | POA: Insufficient documentation

## 2013-03-07 DIAGNOSIS — K7689 Other specified diseases of liver: Secondary | ICD-10-CM | POA: Insufficient documentation

## 2013-03-07 DIAGNOSIS — K228 Other specified diseases of esophagus: Secondary | ICD-10-CM | POA: Insufficient documentation

## 2013-03-07 DIAGNOSIS — R079 Chest pain, unspecified: Secondary | ICD-10-CM | POA: Insufficient documentation

## 2013-03-07 DIAGNOSIS — Z9221 Personal history of antineoplastic chemotherapy: Secondary | ICD-10-CM | POA: Insufficient documentation

## 2013-03-07 DIAGNOSIS — K4020 Bilateral inguinal hernia, without obstruction or gangrene, not specified as recurrent: Secondary | ICD-10-CM | POA: Insufficient documentation

## 2013-03-07 DIAGNOSIS — C3490 Malignant neoplasm of unspecified part of unspecified bronchus or lung: Secondary | ICD-10-CM | POA: Insufficient documentation

## 2013-03-07 DIAGNOSIS — C7970 Secondary malignant neoplasm of unspecified adrenal gland: Secondary | ICD-10-CM | POA: Insufficient documentation

## 2013-03-07 DIAGNOSIS — Z923 Personal history of irradiation: Secondary | ICD-10-CM | POA: Insufficient documentation

## 2013-03-07 MED ORDER — IOHEXOL 300 MG/ML  SOLN
100.0000 mL | Freq: Once | INTRAMUSCULAR | Status: AC | PRN
  Administered 2013-03-07: 100 mL via INTRAVENOUS

## 2013-03-13 ENCOUNTER — Encounter: Payer: Self-pay | Admitting: Radiation Oncology

## 2013-03-13 ENCOUNTER — Telehealth: Payer: Self-pay | Admitting: *Deleted

## 2013-03-13 NOTE — Telephone Encounter (Signed)
Returned call to [phone], couldn';t understand person name calling from [facility], she is requesting dressing changes for patients  Cheek wound, states dressing changes q 3 days, when removing dressing top layer skin comes off,patient has appt this Thursday , left my numv=ber [phone], requesting she call back, and that I am here today until 4:30pm and here in am from 8-4pm, person on phone was talking so fast ,couldn't understand all that was said  3:50 PM

## 2013-03-15 ENCOUNTER — Ambulatory Visit
Admission: RE | Admit: 2013-03-15 | Discharge: 2013-03-15 | Disposition: A | Discharge status: Home / Self Care | Payer: BC Managed Care – PPO | Source: Ambulatory Visit | Attending: Radiation Oncology | Admitting: Radiation Oncology

## 2013-03-15 VITALS — BP 104/71 | HR 92 | Temp 98.8°F | Ht 74.0 in | Wt 142.8 lb

## 2013-03-15 DIAGNOSIS — C3400 Malignant neoplasm of unspecified main bronchus: Secondary | ICD-10-CM

## 2013-03-15 NOTE — Progress Notes (Signed)
Department of Radiation Oncology  Phone:  [phone] Fax:        [phone]   Name: RUARI MUDGETT MRN: [id number]  DOB: December 24, 1950  Date: 03/15/2013  Follow Up Visit Note  Diagnosis: Metastatic non small cell lung cancer  Summary and Interval since last radiation: 1 month from RT to the whole brain completed 02/13/13 to a total dose of 37.5 Gy   Interval History: Dakota presents today for routine followup.  He unfortunately was hospitalized several times after finishing treatment for esophagitis and mental status changes. During his most recent hospitalization an MRI of the brain was performed on 02/25/2013. This showed a decrease in size of his acetic lesion. It now measured 1.6 cm in size previously measuring 3.2 cm. The vasogenic edema had improved as well. No new metastases were noted. He had a CT of the abdomen and pelvis on 03/07/2013 which showed in the left adrenal metastases and radiation change in the lung. This radiation change could also be progressive disease. His wife reason for clinic today in her main complaint is increased somnolence. She states that he has been on increasing doses of Neurontin since being discharged. She stated prior to coming into the hospital he was on 2 pills a day now he is on 6. He spends most of the day sleeping. He is on Decadron 4 mg 3 times a day. They have tried to taper him down but he was hospitalized and mental status changes. She is also noted that he is complained of decreased vision in both of his eyes. He is also been complaining complaining of a mild headache which responds to Tylenol. She's also noticed sores behind his ear as which are improving as well as mouth sores and no sores. The patient himself is unable to communicate today secondary to somnolence. He is falling asleep in his wheelchair.   Allergies: No Known Allergies  Medications:  Current Outpatient Prescriptions  Medication Sig Dispense Refill  . acetaminophen (TYLENOL)  325 MG tablet Take 650 mg by mouth every 6 (six) hours as needed. For general aches      . albuterol (PROVENTIL HFA;VENTOLIN HFA) 108 (90 BASE) MCG/ACT inhaler Inhale 2 puffs into the lungs every 6 (six) hours as needed for wheezing.       Marland Kitchen alum & mag hydroxide-simeth (MAALOX/MYLANTA) 200-200-20 MG/5ML suspension Take 30 mLs by mouth every 6 (six) hours as needed.  355 mL  0  . Alum & Mag Hydroxide-Simeth (MAGIC MOUTHWASH W/LIDOCAINE) SOLN Take 10 mLs by mouth 4 (four) times daily. Take for 10 days.  400 mL  0  . amitriptyline (ELAVIL) 25 MG tablet Take 25 mg by mouth at bedtime.      . citalopram (CELEXA) 20 MG tablet Take 1 tablet (20 mg total) by mouth every evening.  30 tablet  0  . clonazePAM (KLONOPIN) 0.5 MG tablet Take 1 tablet (0.5 mg total) by mouth 2 (two) times daily as needed for anxiety. For anxiety  30 tablet  0  . dexamethasone (DECADRON) 4 MG tablet Take 1 tablet (4 mg total) by mouth 3 (three) times daily.  90 tablet  0  . gabapentin (NEURONTIN) 300 MG capsule Take 600 mg by mouth 3 (three) times daily.      Marland Kitchen levETIRAcetam (KEPPRA) 100 MG/ML solution Take 5 mLs (500 mg total) by mouth 2 (two) times daily.  473 mL  0  . loratadine (CLARITIN) 10 MG tablet Take 10 mg by mouth daily.      Marland Kitchen  omega-3 acid ethyl esters (LOVAZA) 1 G capsule Take 1 capsule (1 g total) by mouth daily.  30 capsule  0  . pantoprazole (PROTONIX) 40 MG tablet Take 1 tablet (40 mg total) by mouth 2 (two) times daily.  62 tablet  0  . polyethylene glycol (MIRALAX / GLYCOLAX) packet Take 17 g by mouth at bedtime.       . sucralfate (CARAFATE) 1 GM/10ML suspension Take 10 mLs (1 g total) by mouth 4 (four) times daily -  with meals and at bedtime.  420 mL  0  . triamcinolone cream (KENALOG) 0.1 % Apply 1 application topically 2 (two) times daily. Apply to nose and face for dry skin      . chlorproMAZINE (THORAZINE) 10 MG tablet Take 1 tablet (10 mg total) by mouth 4 (four) times daily as needed.  90 tablet  0  .  feeding supplement (RESOURCE BREEZE) LIQD Take 1 Container by mouth 3 (three) times daily between meals.      Marland Kitchen HYDROcodone-homatropine (HYCODAN) 5-1.5 MG/5ML syrup Take 5 mLs by mouth every 6 (six) hours as needed for cough.  240 mL  0  . lidocaine-prilocaine (EMLA) cream Apply 1 application topically as needed. Apply to [facility] A cath site 30-60 min before chemotherapy. Used every Wednesday       No current facility-administered medications for this encounter.    Physical Exam:  Filed Vitals:   03/15/13 1401  BP: 104/71  Pulse: 92  Temp: 98.8 F (37.1 C)   Somnolent male in no distress. Sores on mouth and inside of left nose. Dry desquamation behind his bilateral ears.   IMPRESSION: Rameses is a 62 y.o. male s/p whole brain and chest radiation  PLAN:  I spoke to Mr. but his wife today. I think his somnolence is due to his increasing dose of Neurontin. I encouraged her to contact his primary care physician to have that tapered. I'm not sure where the sores in his mouth and nose are coming from. I was clearly not in the radiated fields. The moist escalation in his ears is healing up well. I prescribed Neosporin for this. I don't think it is safe right now to wean him off of his Decadron given his slight headaches and his decreased mental status. I will see him back in 2 weeks and we can talk about Decadron at that time. He is seeing Dr. Shirline Frees next week for discussion of his scan results. I highly doubt he will be a candidate for chemotherapy. I've asked our social worker to be present at that visit for a goals of care discussion.    Lurline Hare, MD

## 2013-03-15 NOTE — Progress Notes (Signed)
Derrick Crosby is here for follow up with his wife after treatment to his whole brain.  Today he denies pain.  He is oriented to person and place.  He is confused about time.  He is sleeping on and off and states that his medication is making him sleepy.  He also has sore areas on both sides of his head underneath his ears.  On the left side, his wife and home health nurse have been applying a dry dressing to it.  The other side is open to air.  He also has scabbed areas on the right side of his lip.  His wife states he eats small amounts. His left nostril is also scabbed and sore.  He states his mouth is also sore.  On inspection, his mouth is red.  He has lost 5 lbs since 03/06/2013.  He denies nausea.  He does have dizziness and states his vision has been getting worse.  He does have dry eyes but his wife thinks it is due to something else.

## 2013-03-19 ENCOUNTER — Telehealth: Payer: Self-pay | Admitting: Dietician

## 2013-03-19 NOTE — Telephone Encounter (Signed)
Brief Outpatient Oncology Nutrition Note  Patient has been identified to be at risk on malnutrition screen.  Wt Readings from Last 10 Encounters:  03/15/13 142 lb 12.8 oz (64.774 kg)  03/06/13 147 lb 12.8 oz (67.042 kg)  03/02/13 147 lb 14.9 oz (67.1 kg)  03/02/13 147 lb 14.9 oz (67.1 kg)  02/13/13 157 lb 1.6 oz (71.26 kg)  02/12/13 158 lb (71.668 kg)  02/06/13 158 lb 6.4 oz (71.85 kg)  01/30/13 163 lb (73.936 kg)  01/22/13 168 lb (76.204 kg)  11/29/12 171 lb (77.565 kg)     Patient with a 17% weight loss in the last 5 months.  Lung cancer mets to brain.    Called and spoke with wife.  Patient with continued problems with a very sore mouth.  Feels that Magic Mouthwash is not working.  Drinking only water.  Dislikes Ensure.    Discussed trying [employer] or other shakes and foods/beverages that are easier to swallow.  Will send tips for sore mouth, coupon for [employer] and contact info for [facility].  Encouraged to call for any questions or an appointment.  Oran Rein, RD, LDN

## 2013-03-20 ENCOUNTER — Encounter: Payer: Self-pay | Admitting: Internal Medicine

## 2013-03-20 ENCOUNTER — Ambulatory Visit (HOSPITAL_BASED_OUTPATIENT_CLINIC_OR_DEPARTMENT_OTHER): Payer: BC Managed Care – PPO | Admitting: Internal Medicine

## 2013-03-20 ENCOUNTER — Other Ambulatory Visit (HOSPITAL_BASED_OUTPATIENT_CLINIC_OR_DEPARTMENT_OTHER): Payer: BC Managed Care – PPO | Admitting: Lab

## 2013-03-20 VITALS — BP 107/79 | HR 98 | Temp 97.2°F | Resp 18 | Ht 74.0 in | Wt 141.5 lb

## 2013-03-20 DIAGNOSIS — C3491 Malignant neoplasm of unspecified part of right bronchus or lung: Secondary | ICD-10-CM

## 2013-03-20 DIAGNOSIS — C7931 Secondary malignant neoplasm of brain: Secondary | ICD-10-CM

## 2013-03-20 DIAGNOSIS — C3400 Malignant neoplasm of unspecified main bronchus: Secondary | ICD-10-CM

## 2013-03-20 DIAGNOSIS — C3490 Malignant neoplasm of unspecified part of unspecified bronchus or lung: Secondary | ICD-10-CM

## 2013-03-20 DIAGNOSIS — R5381 Other malaise: Secondary | ICD-10-CM

## 2013-03-20 LAB — CBC WITH DIFFERENTIAL/PLATELET
BASO%: 0 % (ref 0.0–2.0)
Basophils Absolute: 0 10*3/uL (ref 0.0–0.1)
HCT: 38.8 % (ref 38.4–49.9)
HGB: 13.2 g/dL (ref 13.0–17.1)
MONO#: 0.3 10*3/uL (ref 0.1–0.9)
NEUT%: 88.7 % — ABNORMAL HIGH (ref 39.0–75.0)
WBC: 11.1 10*3/uL — ABNORMAL HIGH (ref 4.0–10.3)
lymph#: 0.9 10*3/uL (ref 0.9–3.3)

## 2013-03-20 LAB — COMPREHENSIVE METABOLIC PANEL (CC13)
ALT: 66 U/L — ABNORMAL HIGH (ref 0–55)
BUN: 23 mg/dL (ref 7.0–26.0)
CO2: 21 mEq/L — ABNORMAL LOW (ref 22–29)
Calcium: 8.8 mg/dL (ref 8.4–10.4)
Chloride: 100 mEq/L (ref 98–107)
Creatinine: 0.9 mg/dL (ref 0.7–1.3)

## 2013-03-20 MED ORDER — ACYCLOVIR 400 MG PO TABS: 400.0000 mg | ORAL_TABLET | Freq: Two times a day (BID) | ORAL | Status: AC

## 2013-03-20 MED ORDER — OXYCODONE-ACETAMINOPHEN 5-325 MG PO TABS: 1.0000 | ORAL_TABLET | ORAL | Status: AC | PRN

## 2013-03-20 NOTE — Patient Instructions (Signed)
You have evidence for disease progression on his recent scan.  We discussed treatment options including referral to palliative care and hospice

## 2013-03-20 NOTE — Progress Notes (Signed)
[facility] Telephone:([phone]) [phone]   [phone]  OFFICE PROGRESS NOTE  [facility] [address] [place] [address]  DIAGNOSIS: Metastatic non-small cell lung cancer, squamous cell carcinoma diagnosed in August of 2013.   PRIOR THERAPY:  1) status post palliative radiotherapy to the left hilar mass under the care of Dr. Mitzi Hansen.  2) Systemic chemotherapy with carboplatin for AUC of 5 on day 1 and Abraxane 100 mg/M2 on days 1, 8 and 15 every 3 weeks. The patient is status post 4 cycles and day 1 and 8 of cycle 5  3) Whole brain irradiation under the care of Dr. Michell Heinrich completed on 02/13/2013   CURRENT THERAPY: Observation.  INTERVAL HISTORY: Derrick Crosby 62 y.o. male returns to the clinic today for follow up visit accompanied by his wife and her son and daughter. The patient has been complaining of increasing fatigue and weakness. His performance status has been declining rapidly over the last few weeks. He denied having any significant nausea or vomiting but he has lack of appetite. He also has questionable zoster infection on the posterior right side of the neck. He had repeat CT scan of the chest, abdomen and pelvis performed recently and he is here for evaluation and discussion of his scan results.  MEDICAL HISTORY: Past Medical History  Diagnosis Date  . Hypertension   . Hyperglyceridemia, pure   . COPD (chronic obstructive pulmonary disease)   . Hemoptysis   . Anxiety   . Depression   . Hemoptysis   . Seasonal allergies   . History of radiation therapy 07/05/12-07/26/12    lung ca   . Cancer     Skin  Cancer - Left ear.-  . Skin cancer     left ear  . Lung cancer 06/13/12    lung mass /right hilar suspicious for primary bronchogenic ca  . 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 History of radiation therapy 01/24/13-02/13/13    whole brain/back/37.5Gy@2 .5Gy per fx x 15 fx    ALLERGIES:  has No Known  Allergies.  MEDICATIONS:  Current Outpatient Prescriptions  Medication Sig Dispense Refill  . acetaminophen (TYLENOL) 325 MG tablet Take 650 mg by mouth every 6 (six) hours as needed. For general aches      . albuterol (PROVENTIL HFA;VENTOLIN HFA) 108 (90 BASE) MCG/ACT inhaler Inhale 2 puffs into the lungs every 6 (six) hours as needed for wheezing.       Marland Kitchen alum & mag hydroxide-simeth (MAALOX/MYLANTA) 200-200-20 MG/5ML suspension Take 30 mLs by mouth every 6 (six) hours as needed.  355 mL  0  . Alum & Mag Hydroxide-Simeth (MAGIC MOUTHWASH W/LIDOCAINE) SOLN Take 10 mLs by mouth 4 (four) times daily. Take for 10 days.  400 mL  0  . amitriptyline (ELAVIL) 25 MG tablet Take 25 mg by mouth at bedtime.      . chlorproMAZINE (THORAZINE) 10 MG tablet Take 1 tablet (10 mg total) by mouth 4 (four) times daily as needed.  90 tablet  0  . citalopram (CELEXA) 20 MG tablet Take 1 tablet (20 mg total) by mouth every evening.  30 tablet  0  . clonazePAM (KLONOPIN) 0.5 MG tablet Take 1 tablet (0.5 mg total) by mouth 2 (two) times daily as needed for anxiety. For anxiety  30 tablet  0  . dexamethasone (DECADRON) 4 MG tablet Take 1 tablet (4 mg total) by mouth 3 (three) times  daily.  90 tablet  0  . HYDROcodone-homatropine (HYCODAN) 5-1.5 MG/5ML syrup Take 5 mLs by mouth every 6 (six) hours as needed for cough.  240 mL  0  . levETIRAcetam (KEPPRA) 500 MG tablet Take 500 mg by mouth every 12 (twelve) hours.      . lidocaine-prilocaine (EMLA) cream Apply 1 application topically as needed. Apply to [facility] A cath site 30-60 min before chemotherapy. Used every Wednesday      . loratadine (CLARITIN) 10 MG tablet Take 10 mg by mouth daily.      Marland Kitchen omega-3 acid ethyl esters (LOVAZA) 1 G capsule Take 1 capsule (1 g total) by mouth daily.  30 capsule  0  . pantoprazole (PROTONIX) 40 MG tablet Take 1 tablet (40 mg total) by mouth 2 (two) times daily.  62 tablet  0  . polyethylene glycol (MIRALAX / GLYCOLAX) packet Take 17 g by  mouth at bedtime.       . sucralfate (CARAFATE) 1 GM/10ML suspension Take 10 mLs (1 g total) by mouth 4 (four) times daily -  with meals and at bedtime.  420 mL  0  . triamcinolone cream (KENALOG) 0.1 % Apply 1 application topically 2 (two) times daily. Apply to nose and face for dry skin      . gabapentin (NEURONTIN) 300 MG capsule Take 600 mg by mouth 3 (three) times daily.       No current facility-administered medications for this visit.    SURGICAL HISTORY:  Past Surgical History  Procedure Laterality Date  . Tympanoplasty    . Video bronchoscopy  06/13/12    with endobronchial u/s/ with biospy left hilar mass/Dr. Lonia Farber  . Tonsillectomy      child    REVIEW OF SYSTEMS:  A comprehensive review of systems was negative except for: Constitutional: positive for anorexia, fatigue and weight loss Respiratory: positive for cough and dyspnea on exertion Musculoskeletal: positive for muscle weakness   PHYSICAL EXAMINATION: General appearance: alert, cooperative, distracted, fatigued and no distress Head: Normocephalic, without obvious abnormality, atraumatic Neck: no adenopathy, with questionable zoster rash on the posterior right side of the neck. Lymph nodes: Cervical, supraclavicular, and axillary nodes normal. Resp: wheezes bilaterally Cardio: regular rate and rhythm, S1, S2 normal, no murmur, click, rub or gallop GI: soft, non-tender; bowel sounds normal; no masses,  no organomegaly Extremities: extremities normal, atraumatic, no cyanosis or edema  ECOG PERFORMANCE STATUS: 3 - Symptomatic, >50% confined to bed  Blood pressure 107/79, pulse 98, temperature 97.2 F (36.2 C), temperature source Oral, resp. rate 18, height 6\' 2"  (1.88 m), weight 141 lb 8 oz (64.184 kg).  LABORATORY DATA: Lab Results  Component Value Date   WBC 11.1* 03/20/2013   HGB 13.2 03/20/2013   HCT 38.8 03/20/2013   MCV 89.3 03/20/2013   PLT 62* 03/20/2013      Chemistry      Component  Value Date/Time   NA 135* 03/06/2013 1001   NA 136 03/02/2013 0545   K 4.6 03/06/2013 1001   K 3.9 03/02/2013 0545   CL 100 03/06/2013 1001   CL 100 03/02/2013 0545   CO2 26 03/06/2013 1001   CO2 27 03/02/2013 0545   BUN 20.9 03/06/2013 1001   BUN 20 03/02/2013 0545   CREATININE 0.8 03/06/2013 1001   CREATININE 0.85 03/02/2013 0545      Component Value Date/Time   CALCIUM 8.8 03/06/2013 1001   CALCIUM 8.6 03/02/2013 0545   ALKPHOS 101 03/06/2013 1001  ALKPHOS 88 02/28/2013 0510   AST 17 03/06/2013 1001   AST 20 02/28/2013 0510   ALT 65* 03/06/2013 1001   ALT 68* 02/28/2013 0510   BILITOT 0.44 03/06/2013 1001   BILITOT 0.4 02/28/2013 0510       RADIOGRAPHIC STUDIES: Dg Chest 2 View  02/24/2013  *RADIOLOGY REPORT*  Clinical Data: Cough.  History of lung cancer.  CHEST - 2 VIEW  Comparison: Chest CT 11/27/2012  Findings: Airspace opacity noted in the left upper lobe.  It is unclear if this could represent postradiation changes or area of infection/pneumonia.  No confluent opacity on the right.  Right Port-A-Cath remains in place, unchanged.  Heart is normal size.  No visible effusions.  IMPRESSION: Left upper lobe airspace opacity.  Recommend correlation for any interval radiation to this area has this could represent radiation pneumonitis.  Cannot exclude pneumonia.   Original Report Authenticated By: Charlett Nose, M.D.    Ct Head Wo Contrast  02/24/2013  *RADIOLOGY REPORT*  Clinical Data: Confusion.  Metastatic lung cancer.  CT HEAD WITHOUT CONTRAST  Technique:  Contiguous axial images were obtained from the base of the skull through the vertex without contrast.  Comparison: CT and MRI 01/22/2013  Findings: The previously seen left frontal lesion appears improved with near complete resolution of the left frontal edema.  No visible mass currently on this unenhanced CT.  No hemorrhage, hydrocephalus or acute infarction.  Small air-fluid levels in the maxillary sinuses.  Diffuse ethmoid mucosal thickening.   Mastoids are clear.  IMPRESSION: Significant improvement in the left frontal lesion and surrounding edema.  No acute infarction or hemorrhage.   Original Report Authenticated By: Charlett Nose, M.D.    Ct Chest W Contrast  03/07/2013  *RADIOLOGY REPORT*  Clinical Data:  Metastatic lung cancer with history of chemotherapy and radiation therapy.  Chest pain and shortness of breath.  CT CHEST, ABDOMEN AND PELVIS WITH CONTRAST  Technique:  Multidetector CT imaging of the chest, abdomen and pelvis was performed following the standard protocol during bolus administration of intravenous contrast.  Contrast: OMNIPAQUE IOHEXOL 300 MG/ML  SOLN  Comparison:  11/27/2012.  CT CHEST  Findings:  No pathologically enlarged mediastinal, right hilar or axillary lymph nodes.  Left hilar lymph node measures left hilar lymph node measures 1.3 x 1.8 cm, stable.  There is dilatation of the mid esophagus with fluid, proximal to focal esophageal wall thickening in the distal esophagus (image 37).  Heart size normal. No pericardial effusion.  Coronary artery calcification.  Centrilobular emphysema.   There are multiple new irregular nodular airspace opacities bilaterally with a somewhat thick-walled cavitary lesion in the left lower lobe, measuring 2.5 x 3.2 cm. Extensive ground-glass air space disease with associated architectural distortion and bronchiectasis is seen throughout the left upper lobe, with some involvement of the left lower lobe. Peribronchovascular nodularity in the lingula and left lower lobe. Findings are new from 11/27/2012.  No pleural fluid.  Minimal dependent debris in the airway.  IMPRESSION:  1.  New irregular nodular air space disease bilaterally, with areas of cavitation, worst in the left lower lobe. While the time course favors an infectious etiology, a new left adrenal nodule (see below) on today's study warrants consideration of metastatic disease as well. 2.  New and fairly diffuse ground-glass opacity  with associated architectural distortion and bronchiectasis in the left lung may be due to radiation therapy.  An infectious or post infectious etiology is another consideration. 3.  Stable left hilar  soft tissue or adenopathy. 4.  Thickening of the distal esophagus with proximal dilatation, consistent with a known history of radiation esophagitis.  CT ABDOMEN AND PELVIS  Findings:  Scattered low attenuation lesions in the liver measure up to 2.3 cm in the inferior right hepatic lobe, as before. Gallbladder and right adrenal gland are unremarkable.  A new nodule in the left adrenal gland measures 2.0 x 2.3 cm.  Scattered tiny low attenuation lesions in the kidneys are too small to characterize.  Possible small stones in the lower pole left kidney. Spleen, pancreas, stomach and bowel are unremarkable.  Tiny bilateral inguinal hernias contain fat.  Atherosclerotic calcification of the arterial vasculature without abdominal aortic aneurysm.  No pathologically enlarged lymph nodes.  No free fluid. No worrisome lytic or sclerotic lesions.  IMPRESSION:  1.  New left adrenal metastasis. 2.  Possible left renal stones.   Original Report Authenticated By: Leanna Battles, M.D.    Mr Laqueta Jean Wo Contrast  02/25/2013  *RADIOLOGY REPORT*  Clinical Data: 62 year old male with metastatic lung cancer. Solitary brain metastasis detected on 01/22/2013.  Confusion. Restaging.  MRI HEAD WITHOUT AND WITH CONTRAST  Technique:  Multiplanar, multiecho pulse sequences of the brain and surrounding structures were obtained according to standard protocol without and with intravenous contrast  Contrast: 14mL MULTIHANCE GADOBENATE DIMEGLUMINE 529 MG/ML IV SOLN  Comparison: 01/22/2013 and earlier.  Findings: Regressed left superior frontal gyrus enhancing metastasis, enhancing component now measuring up to 16 x 16 mm transaxially, previously 32 x 22 mm.  No additional brain parenchymal metastasis identified.  Significantly reduced edema  surrounding the known metastasis. Stable gray and white matter signal elsewhere.  Currently no significant mass effect. No restricted diffusion to suggest acute infarction.  Major intracranial vascular flow voids are stable. No acute intracranial hemorrhage identified.  Negative pituitary, cervicomedullary junction, and visible cervical spine.  The normal bone marrow signal.  Visualized orbit soft tissues are within normal limits.  Increased paranasal sinus mucosal thickening and fluid levels.  Small left mastoid effusion is new.  Negative nasopharynx.  Negative scalp soft tissues.  IMPRESSION: 1.  Regression of solitary left superior frontal gyrus brain metastasis.  Significantly reduced edema and no significant mass effect at this time. 2.  No additional metastatic disease or new intracranial abnormality identified.   Original Report Authenticated By: Erskine Speed, M.D.    US Abdomen Complete  02/27/2013  *RADIOLOGY REPORT*  Clinical Data:    Elevated liver function tests.  Worsening thrombocytopenia.  The  COMPLETE ABDOMINAL ULTRASOUND  Comparison:  CT abdomen and pelvis 11/27/2012.  Findings:  Gallbladder:  Multiple small stones are present.  Gallbladder wall thickening is noted up to 4.5 mm.  Negative sonographic Murphy's sign.  Common bile duct:  Normal measuring 4.0 mm.  Liver:  Increased echogenicity. No intrahepatic biliary ductal dilatation.Multiple cysts are demonstrated consistent with the CT appearance.  No solid masses.  IVC:  Appears normal.  Pancreas:  Not well visualized due to bowel gas.  Spleen:  Normal measuring 8.2 cm.  Right Kidney:  Normal measuring 10.1 cm.  Left Kidney:  Normal measuring 11.6 cm.  Abdominal aorta:  No aneurysm identified.  IMPRESSION: Cholelithiasis.  Gallbladder wall thickening up to 4.5 mm.  No right upper quadrant tenderness or biliary ductal dilatation. Hepatic steatosis without visible metastatic disease.   Original Report Authenticated By: Davonna Belling, M.D.    Ct  Abdomen Pelvis W Contrast  03/07/2013  *RADIOLOGY REPORT*  Clinical Data:  Metastatic lung  cancer with history of chemotherapy and radiation therapy.  Chest pain and shortness of breath.  CT CHEST, ABDOMEN AND PELVIS WITH CONTRAST  Technique:  Multidetector CT imaging of the chest, abdomen and pelvis was performed following the standard protocol during bolus administration of intravenous contrast.  Contrast: OMNIPAQUE IOHEXOL 300 MG/ML  SOLN  Comparison:  11/27/2012.  CT CHEST  Findings:  No pathologically enlarged mediastinal, right hilar or axillary lymph nodes.  Left hilar lymph node measures left hilar lymph node measures 1.3 x 1.8 cm, stable.  There is dilatation of the mid esophagus with fluid, proximal to focal esophageal wall thickening in the distal esophagus (image 37).  Heart size normal. No pericardial effusion.  Coronary artery calcification.  Centrilobular emphysema.   There are multiple new irregular nodular airspace opacities bilaterally with a somewhat thick-walled cavitary lesion in the left lower lobe, measuring 2.5 x 3.2 cm. Extensive ground-glass air space disease with associated architectural distortion and bronchiectasis is seen throughout the left upper lobe, with some involvement of the left lower lobe. Peribronchovascular nodularity in the lingula and left lower lobe. Findings are new from 11/27/2012.  No pleural fluid.  Minimal dependent debris in the airway.  IMPRESSION:  1.  New irregular nodular air space disease bilaterally, with areas of cavitation, worst in the left lower lobe. While the time course favors an infectious etiology, a new left adrenal nodule (see below) on today's study warrants consideration of metastatic disease as well. 2.  New and fairly diffuse ground-glass opacity with associated architectural distortion and bronchiectasis in the left lung may be due to radiation therapy.  An infectious or post infectious etiology is another consideration. 3.  Stable left  hilar soft tissue or adenopathy. 4.  Thickening of the distal esophagus with proximal dilatation, consistent with a known history of radiation esophagitis.  CT ABDOMEN AND PELVIS  Findings:  Scattered low attenuation lesions in the liver measure up to 2.3 cm in the inferior right hepatic lobe, as before. Gallbladder and right adrenal gland are unremarkable.  A new nodule in the left adrenal gland measures 2.0 x 2.3 cm.  Scattered tiny low attenuation lesions in the kidneys are too small to characterize.  Possible small stones in the lower pole left kidney. Spleen, pancreas, stomach and bowel are unremarkable.  Tiny bilateral inguinal hernias contain fat.  Atherosclerotic calcification of the arterial vasculature without abdominal aortic aneurysm.  No pathologically enlarged lymph nodes.  No free fluid. No worrisome lytic or sclerotic lesions.  IMPRESSION:  1.  New left adrenal metastasis. 2.  Possible left renal stones.   Original Report Authenticated By: Leanna Battles, M.D.    Dg Esophagus  02/28/2013  *RADIOLOGY REPORT*  Clinical Data:Dysphagia.  Esophageal pain.  The patient has had radiation therapy secondary to lung cancer, which was immediately adjacent to the esophagus in the subcarinal region on the left.  ESOPHAGUS/BARIUM SWALLOW/TABLET STUDY  Fluoroscopy Time: 1 minute 7 seconds slow pulsed fluoroscopy  Comparison: None.  Findings: There is an 8 cm segment of irregularity of the esophageal mucosa from approximately the level of the carina to just above the gastroesophageal junction consistent with esophagitis.  However, there is no stricture or mass.  A 13 mm barium tablet passed immediately through that area without delay. The cervical and upper thoracic esophagus appears normal.  There is no hiatal hernia.  IMPRESSION: 8 cm segment of esophagitis of the mid to distal esophagus in the region of the area of previous radiation.  However,  there is no mass lesion or stricture.   Original Report  Authenticated By: Francene Boyers, M.D.     ASSESSMENT: this is a very pleasant 62 years old white male with metastatic non-small cell lung cancer recently with metastatic brain lesion status post whole brain irradiation. The patient has evidence for disease progression on his recent scan of the chest, abdomen and pelvis and his performance status has been declining significantly.   PLAN: I have a lengthy discussion with the patient and his family today about his condition and treatment options. I do think the patient would be a good candidate for any further chemotherapy at this point. I recommended for the patient palliative care and hospice referral. The patient and his family agreed to the current plan. We'll call the hospice service of [place] to evaluate the patient and his management with his palliative care. I would see him on as-needed basis at this point. The patient and his family were very appreciative of the care provided to him during his treatment. They were advised to call immediately if he has any concerning symptoms.  All questions were answered. The patient knows to call the clinic with any problems, questions or concerns. We can certainly see the patient much sooner if necessary.

## 2013-03-20 NOTE — Progress Notes (Signed)
Hospice referral faxed into hospice in [place], [place].  [phone] SL[facility]

## 2013-03-21 ENCOUNTER — Telehealth: Payer: Self-pay | Admitting: Medical Oncology

## 2013-03-21 ENCOUNTER — Encounter: Payer: Self-pay | Admitting: Internal Medicine

## 2013-03-21 NOTE — Telephone Encounter (Signed)
Returned [facility] from hospice. Faxed demographics. Dr Arbutus Ped will be attending with Dr Volney American doing symptom management

## 2013-03-21 NOTE — Progress Notes (Signed)
[facility] called. Angie at [phone] ext [phone] needed the patient's insurance information(policy#). They are trying to file. I gave

## 2013-03-21 NOTE — Telephone Encounter (Signed)
03/20/13 Office note faxed to hospice.

## 2013-03-29 ENCOUNTER — Ambulatory Visit
Admission: RE | Admit: 2013-03-29 | Payer: BC Managed Care – PPO | Source: Ambulatory Visit | Admitting: Radiation Oncology

## 2013-04-03 ENCOUNTER — Telehealth: Payer: Self-pay | Admitting: Medical Oncology

## 2013-04-03 NOTE — Telephone Encounter (Signed)
Transferrign now to [employer] [facility]

## 2013-04-04 ENCOUNTER — Telehealth: Payer: Self-pay | Admitting: Medical Oncology

## 2013-04-04 NOTE — Telephone Encounter (Signed)
Message copied by Charma Igo on Wed Apr 04, 2013 12:34 PM ------      Message from: [name]      Created: Tue Apr 03, 2013 10:13 PM       yes      ----- Message -----         From: Charma Igo, RN         Sent: 04/03/2013   5:08 PM           To: [name], MD            Do you want Dr Volney American to be attending now that pt at [facility] house?       ------

## 2013-04-04 NOTE — Telephone Encounter (Signed)
Called and left message with  Patrica Duel.

## 2013-04-06 ENCOUNTER — Encounter: Payer: Self-pay | Admitting: *Deleted

## 2013-04-06 NOTE — Progress Notes (Signed)
RECEIVED A TELEPHONE CALL FROM MISTY NICHOLS, SOCIAL WORKER, AT [facility]. PT. DIED ON 2013/04/16 AT 4:15PM. HIS WIFE WAS PRESENT. THIS NOTE WAS GIVEN TO DR.MOHAMED'S NURSE, DIANE BELL,RN.

## 2013-04-08 DEATH — deceased

## 2013-04-09 ENCOUNTER — Encounter: Payer: Self-pay | Admitting: Internal Medicine

## 2013-04-09 NOTE — Progress Notes (Signed)
Put disability form on nurse's desk. °

## 2013-04-11 ENCOUNTER — Encounter: Payer: Self-pay | Admitting: Internal Medicine

## 2013-04-11 NOTE — Progress Notes (Signed)
Put form in registration desk for a  family member to pick up.

## 2013-07-16 ENCOUNTER — Encounter: Payer: Self-pay | Admitting: Internal Medicine

## 2013-07-16 NOTE — Progress Notes (Signed)
Put [employer] form on nurse's desk.

## 2013-07-20 ENCOUNTER — Encounter: Payer: Self-pay | Admitting: Internal Medicine

## 2013-07-20 NOTE — Progress Notes (Signed)
Mailed insurance form to patient's wife.

## 2013-07-23 ENCOUNTER — Telehealth: Payer: Self-pay | Admitting: Medical Oncology

## 2013-07-23 NOTE — Telephone Encounter (Signed)
I notified wife and she said she has not received the insurance form . Note to [facility]

## 2013-07-23 NOTE — Telephone Encounter (Signed)
I returned [name]' call and told her the insurance form was mailed out on 07/20/13

## 2014-03-17 IMAGING — CT CT ABD-PELV W/ CM
2 of 5 series · 16 of 46 positions shown, 18 images · IV contrast (OMNIPAQUE)
Comparison: CT 09/13/2012

CT CHEST

CLINICAL DATA: Lung cancer diagnosed in May 2012.  Ongoing
chemotherapy.

CT CHEST, ABDOMEN AND PELVIS WITH CONTRAST
TECHNIQUE: Multidetector CT imaging of the chest, abdomen and
pelvis was performed following the standard protocol during bolus
administration of intravenous contrast.
Contrast: 100mL OMNIPAQUE IOHEXOL 300 MG/ML  SOLN

[Series 2: cap with st · axial · 0.72mm/px · z∈[+648,+1244]mm · 13 of 135 slices shown, 15 images]
[im 8/135  soft-tissue]
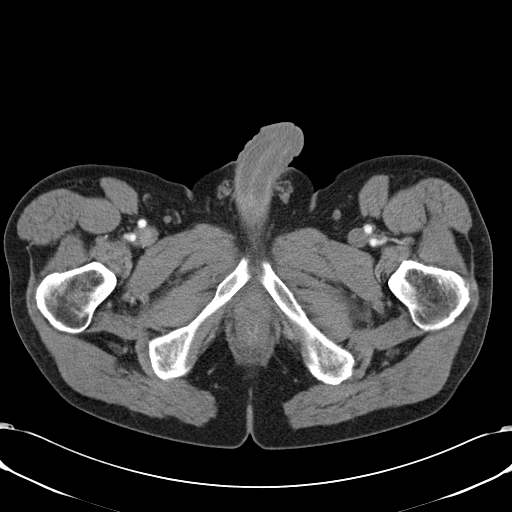
[im 8/135  bone]
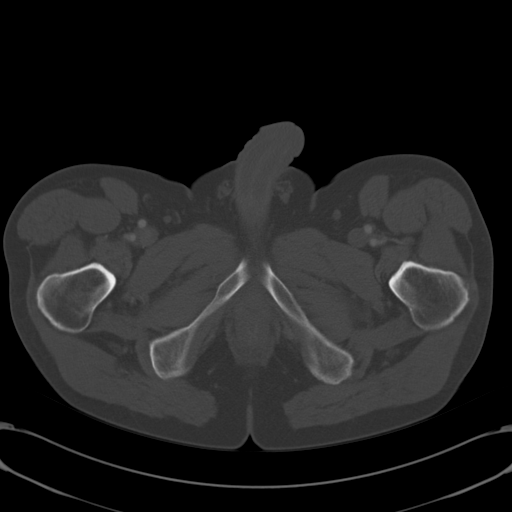
[im 16/135  soft-tissue]
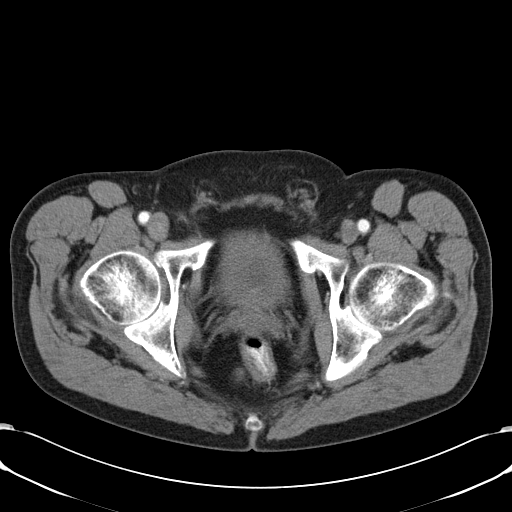
[im 32/135  soft-tissue]
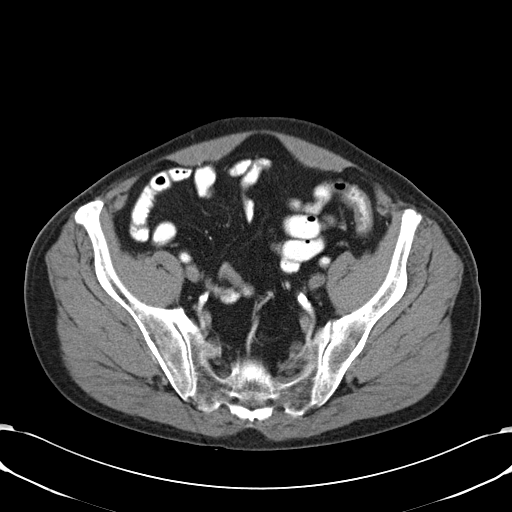
[im 40/135  soft-tissue]
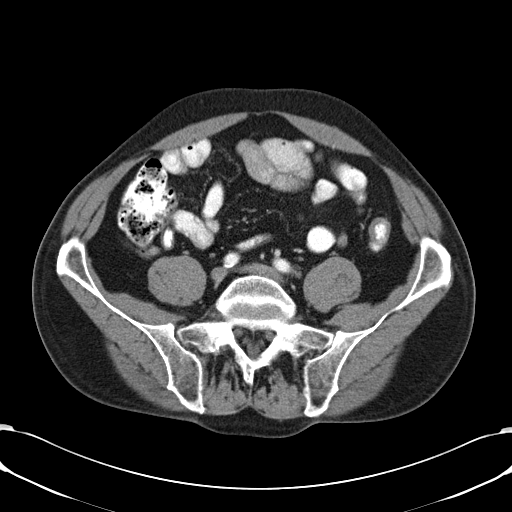
[im 48/135  soft-tissue]
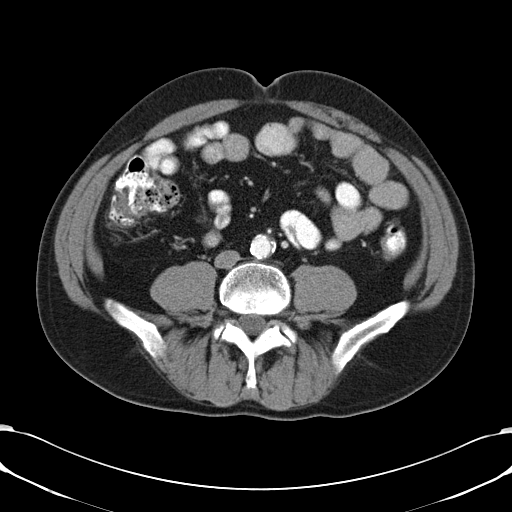
[im 56/135  soft-tissue]
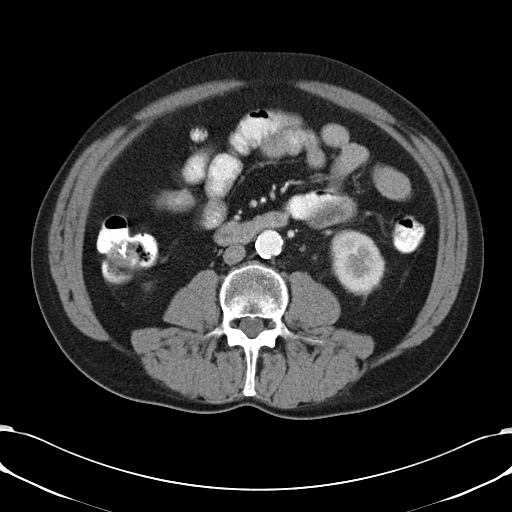
[im 71/135  soft-tissue]
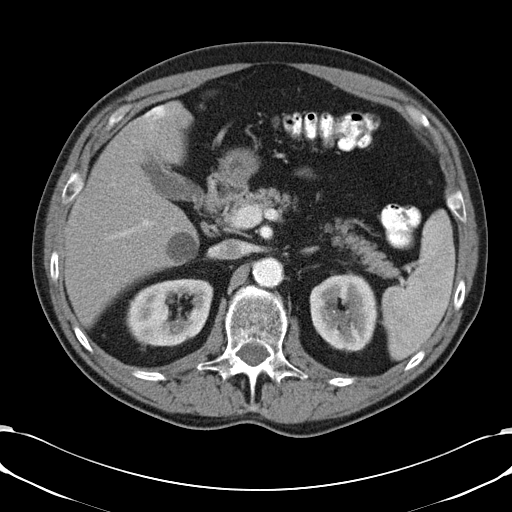
[im 79/135  soft-tissue]
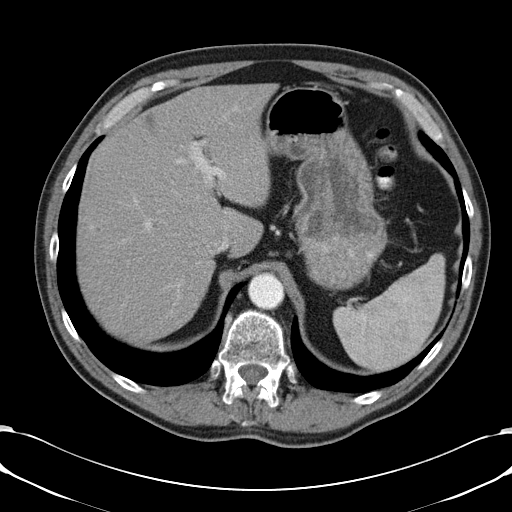
[im 87/135  soft-tissue]
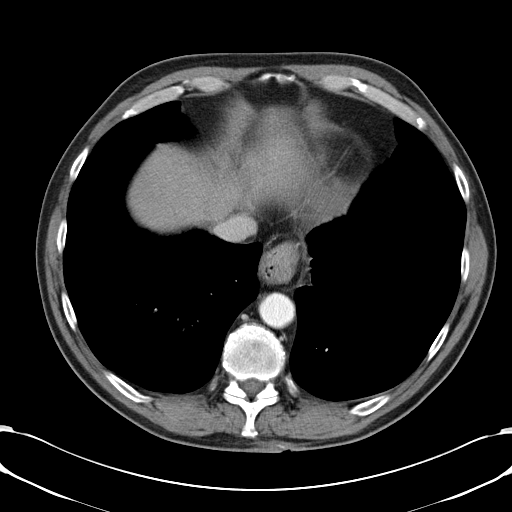
[im 87/135  bone]
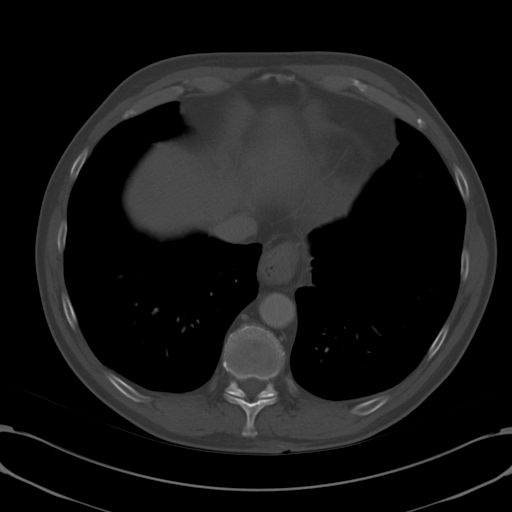
[im 95/135  soft-tissue]
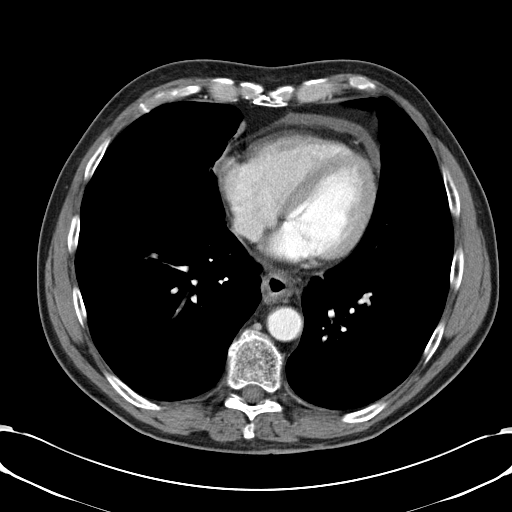
[im 103/135  soft-tissue]
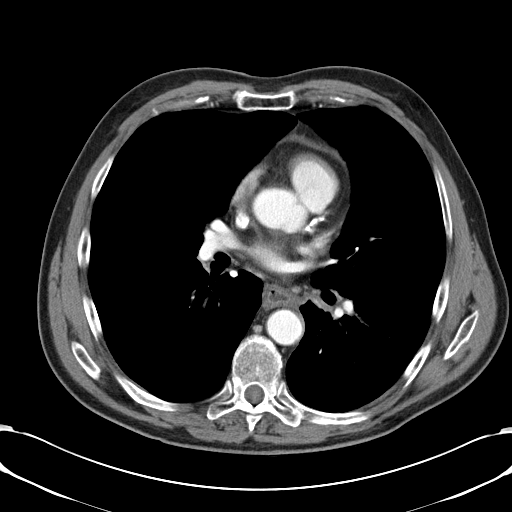
[im 119/135  soft-tissue]
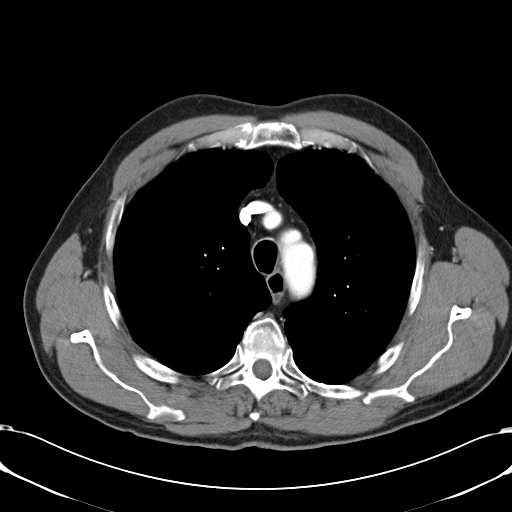
[im 127/135  soft-tissue]
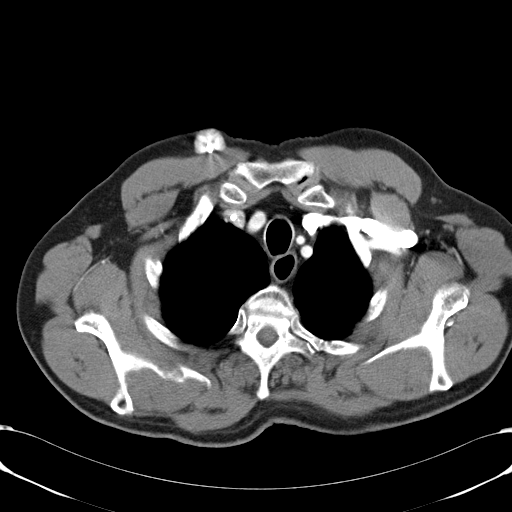

[Series 602: <mpr thick range> · coronal · 1.31mm/px · 3 of 100 slices shown]
[im 34/100  soft-tissue]
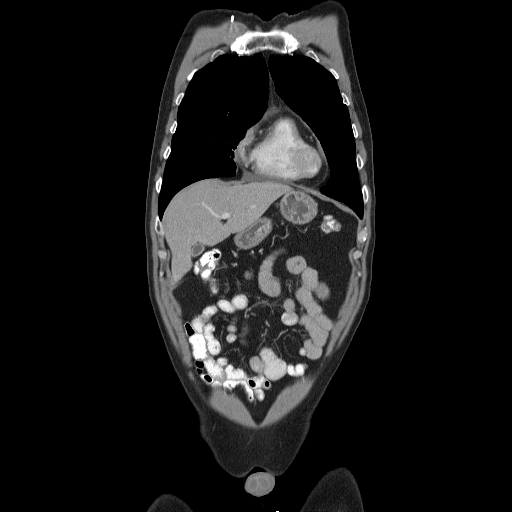
[im 45/100  soft-tissue]
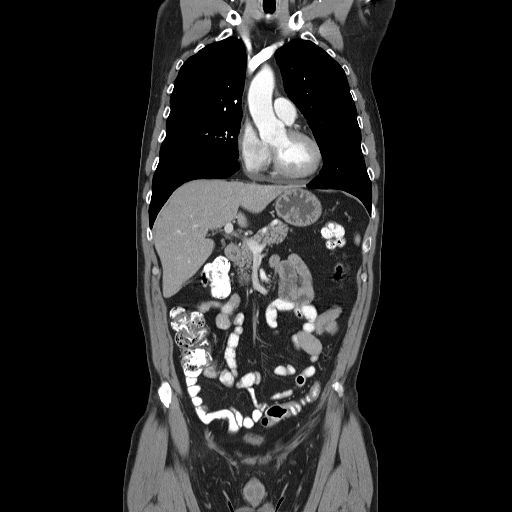
[im 56/100  soft-tissue]
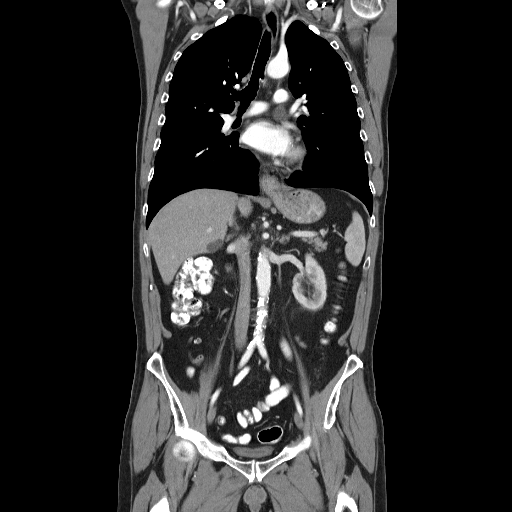

[16 of 46 positions shown; findings below may reference images not displayed]

FINDINGS: There is a port in the right anterior chest wall.  No
axillary or supraclavicular lymphadenopathy.  No mediastinal or
hilar lymphadenopathy.  There is small pericardial effusion
present.  There is thickening of the distal esophagus which is
circumferential (image 40).

Review of the lung parenchyma demonstrates central lobular
emphysema.  There is a reticular linear pattern in the left upper
lobe unchanged.  This is at site of prior perihilar hypermetabolic
mass.  There is mild thickening the posterior to the distal right
to left mainstem bronchus measuring 19 x 16 mm in similar to 22 x
14 mm on prior for no significant change.
IMPRESSION: 1.  Mild thickening in the left hilum is unchanged from prior.
This is site of previous large hypermetabolic mass.
2.  No evidence of disease progression in the thorax.

3.  Thickening of the distal esophagus likely relates to radiation
esophagitis.

CT ABDOMEN AND PELVIS
FINDINGS: There multiple low-density lesions within the liver
which are not changed in size or number compared to prior most
consistent with hepatic cysts.  The pancreas, spleen, adrenal
glands and kidneys are stable.  Evidence of right adrenal
recurrence.

The stomach is normal.  Hiatal hernia is present.  The small bowel,
appendix, and cecum are normal.  Colon rectosigmoid colon are
normal.

Abdominal aorta is normal caliber.  No retroperitoneal periportal
lymphadenopathy.  No free fluid the pelvis.  Prostate gland bladder
normal.  No pelvic lymphadenopathy. Review of  bone windows
demonstrates no aggressive osseous lesions.
IMPRESSION: 1..  No evidence of metastatic disease  within the abdomen pelvis.
2..  No evidence of adrenal gland metastatic recurrence.
3.  Stable low density hepatic lesions likely represent cysts.

## 2014-06-17 IMAGING — US US ABDOMEN COMPLETE
1 series · 14 of 25 positions shown · non-contrast
Comparison: CT abdomen and pelvis 11/27/2012.

CLINICAL DATA: Elevated liver function tests.  Worsening
thrombocytopenia.  The

COMPLETE ABDOMINAL ULTRASOUND

[Series 1: us abdomen complete · 0.32mm/px · 14 of 73 slices shown]
[im 1/73]
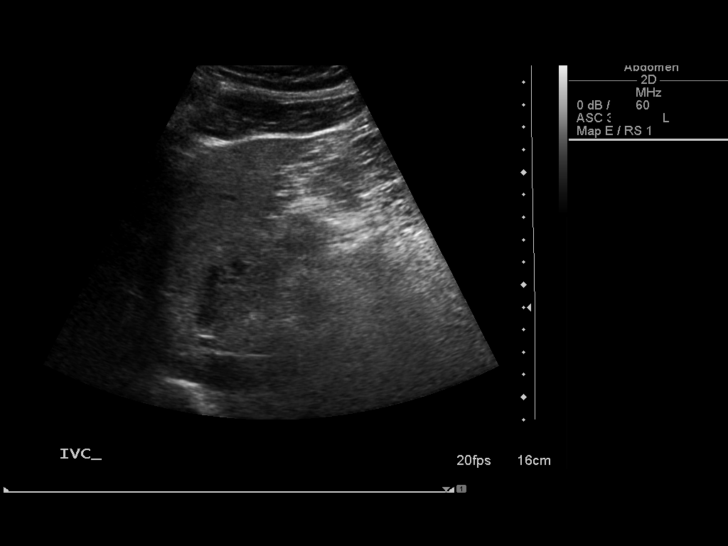
[im 7/73]
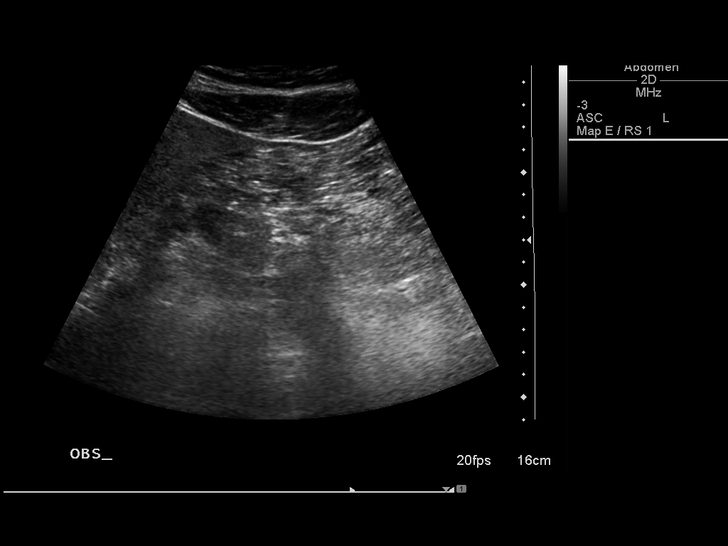
[im 13/73]
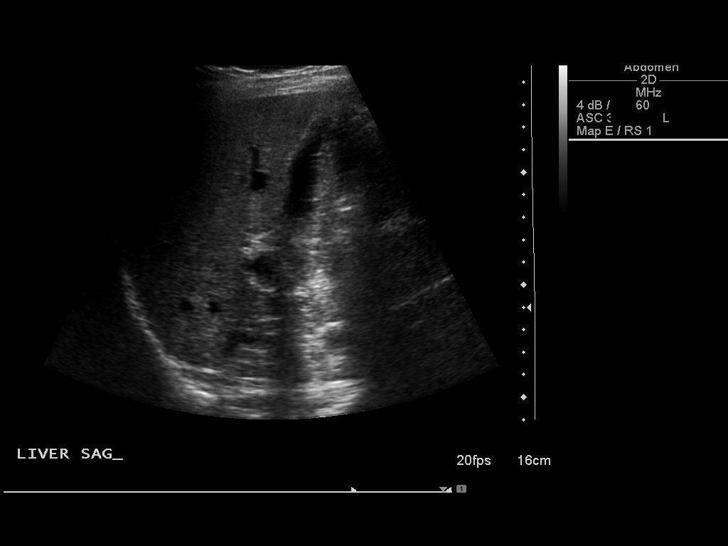
[im 19/73]
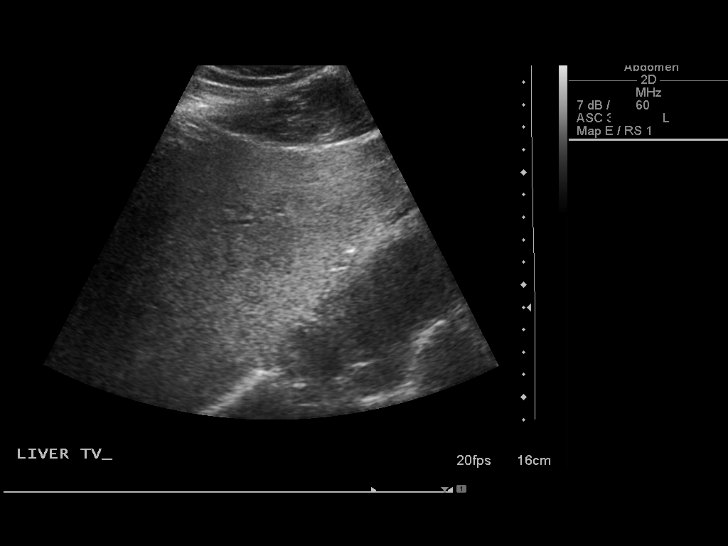
[im 25/73]
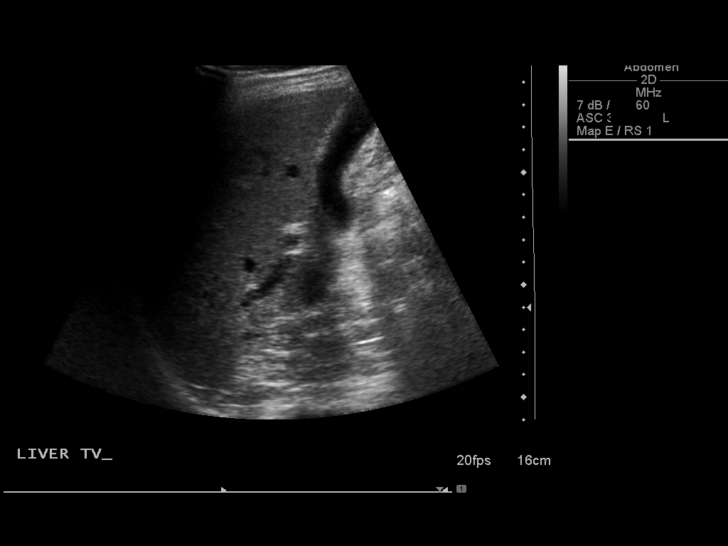
[im 28/73]
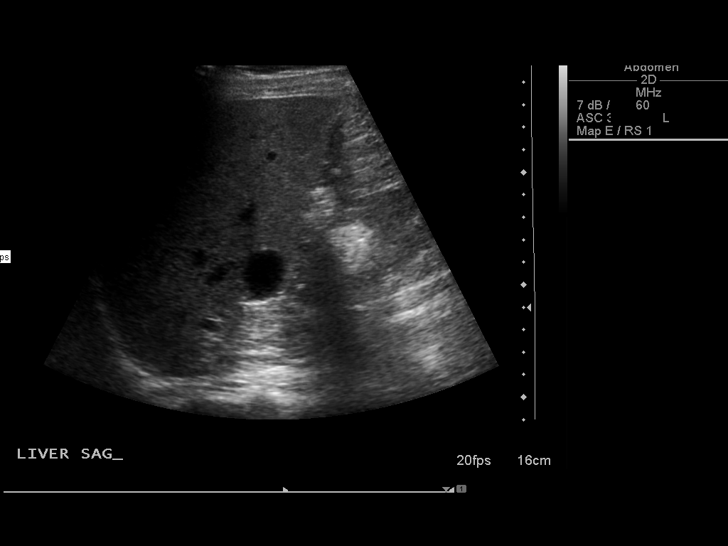
[im 34/73]
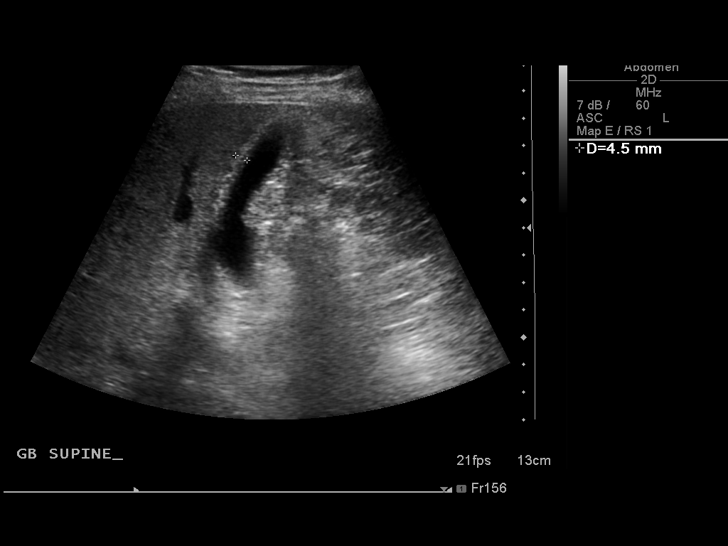
[im 40/73]
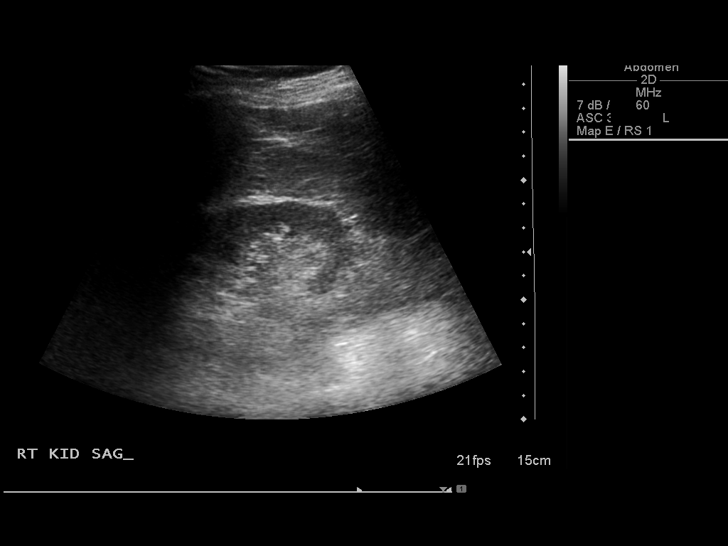
[im 46/73]
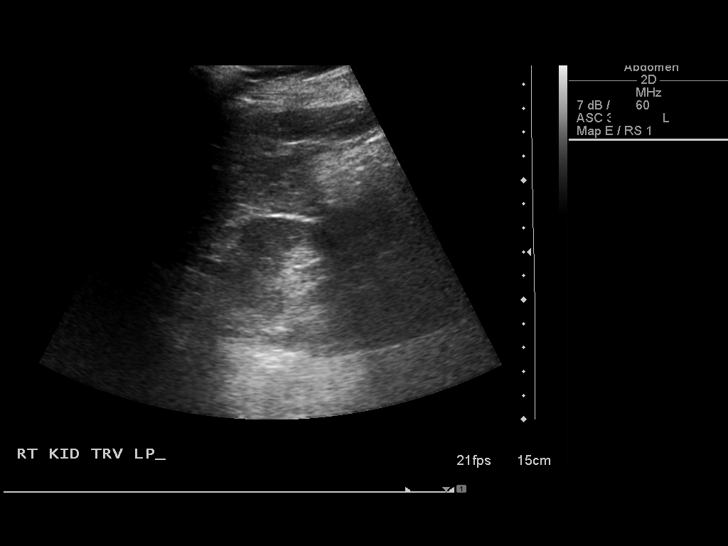
[im 49/73]
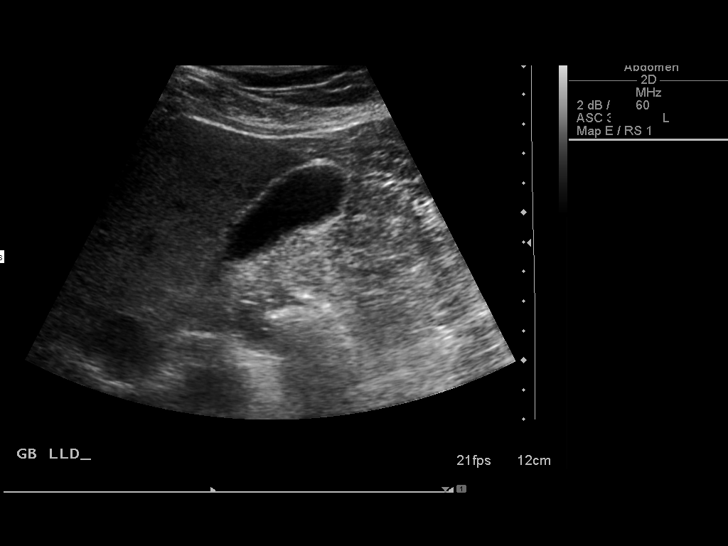
[im 55/73]
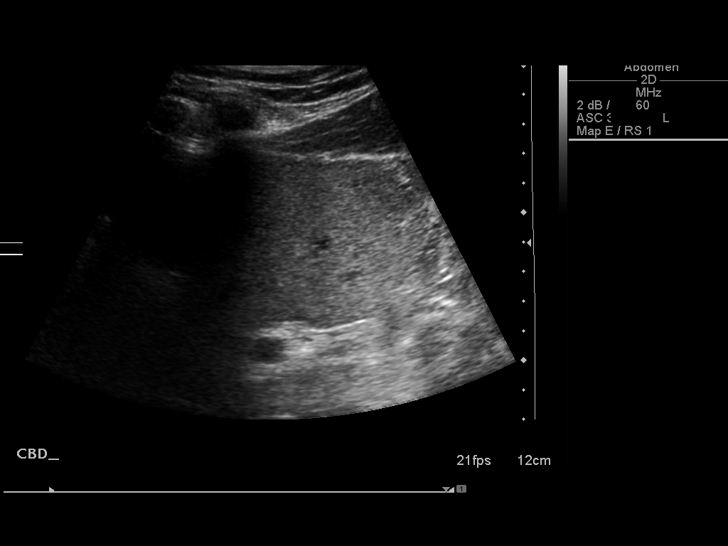
[im 61/73]
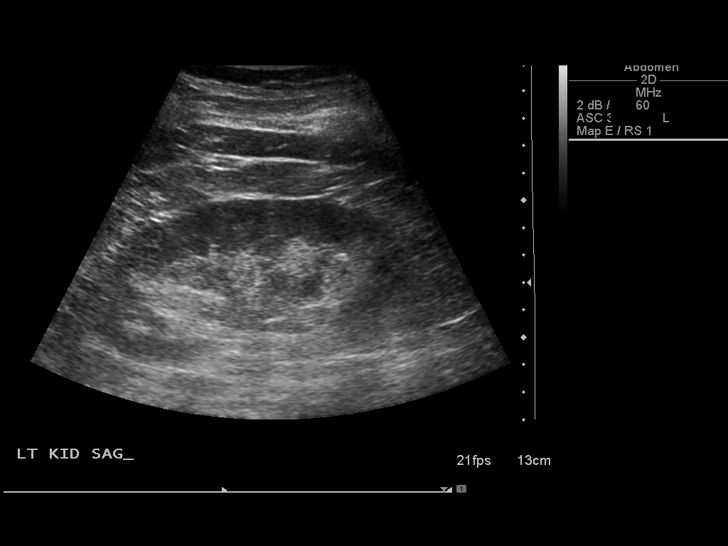
[im 67/73]
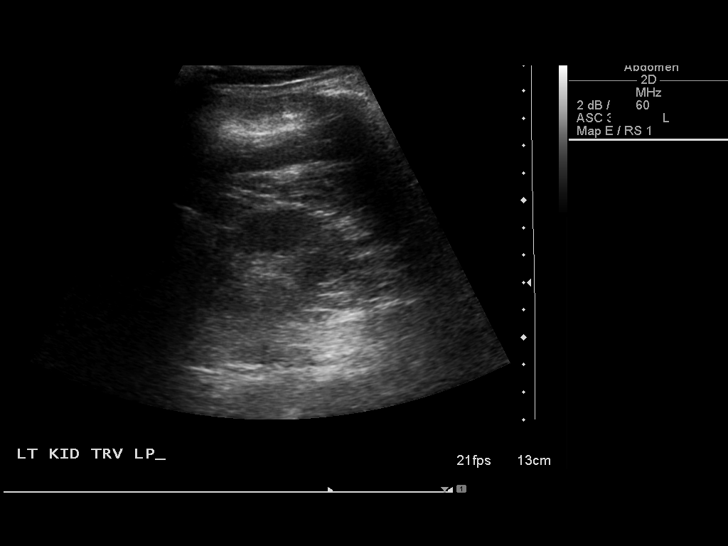
[im 73/73]
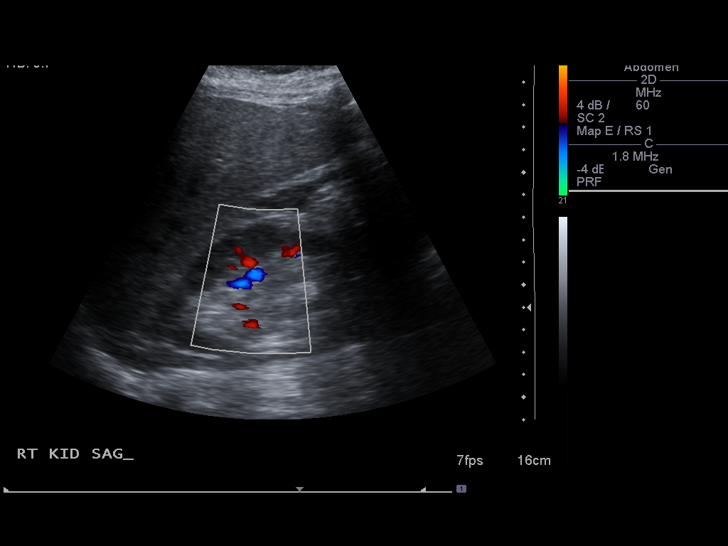

[14 of 25 positions shown; findings below may reference images not displayed]

FINDINGS: Gallbladder:  Multiple small stones are present.  Gallbladder wall
thickening is noted up to 4.5 mm.  Negative sonographic Murphy's
sign.

Common bile duct:  Normal measuring 4.0 mm.

Liver:  Increased echogenicity. No intrahepatic biliary ductal
dilatation.Multiple cysts are demonstrated consistent with the CT
appearance.  No solid masses.

IVC:  Appears normal.

Pancreas:  Not well visualized due to bowel gas.

Spleen:  Normal measuring 8.2 cm.

Right Kidney:  Normal measuring 10.1 cm.

Left Kidney:  Normal measuring 11.6 cm.

Abdominal aorta:  No aneurysm identified.
IMPRESSION: Cholelithiasis.  Gallbladder wall thickening up to 4.5 mm.  No
right upper quadrant tenderness or biliary ductal dilatation.
Hepatic steatosis without visible metastatic disease.

## 2014-07-02 ENCOUNTER — Other Ambulatory Visit: Payer: Self-pay | Admitting: *Deleted
